# Patient Record
Sex: Female | Born: 1940 | Race: Black or African American | Hispanic: No | Smoking: Former smoker
Health system: Southern US, Community
[De-identification: ages and names within clinical notes are randomized; demographics above are authoritative.]

## PROBLEM LIST (undated history)

## (undated) DIAGNOSIS — I499 Cardiac arrhythmia, unspecified: Secondary | ICD-10-CM

## (undated) DIAGNOSIS — H35349 Macular cyst, hole, or pseudohole, unspecified eye: Secondary | ICD-10-CM

## (undated) DIAGNOSIS — R7303 Prediabetes: Secondary | ICD-10-CM

## (undated) DIAGNOSIS — M1990 Unspecified osteoarthritis, unspecified site: Secondary | ICD-10-CM

## (undated) DIAGNOSIS — R4189 Other symptoms and signs involving cognitive functions and awareness: Secondary | ICD-10-CM

## (undated) DIAGNOSIS — E119 Type 2 diabetes mellitus without complications: Secondary | ICD-10-CM

## (undated) DIAGNOSIS — N289 Disorder of kidney and ureter, unspecified: Secondary | ICD-10-CM

## (undated) DIAGNOSIS — Z973 Presence of spectacles and contact lenses: Secondary | ICD-10-CM

## (undated) DIAGNOSIS — I10 Essential (primary) hypertension: Secondary | ICD-10-CM

## (undated) DIAGNOSIS — D649 Anemia, unspecified: Secondary | ICD-10-CM

## (undated) HISTORY — PX: EYE SURGERY: SHX253

## (undated) HISTORY — DX: Other symptoms and signs involving cognitive functions and awareness: R41.89

## (undated) HISTORY — DX: Prediabetes: R73.03

## (undated) HISTORY — PX: ABDOMINAL HYSTERECTOMY: SHX81

---

## 2000-06-19 ENCOUNTER — Ambulatory Visit (HOSPITAL_BASED_OUTPATIENT_CLINIC_OR_DEPARTMENT_OTHER): Admission: RE | Admit: 2000-06-19 | Discharge: 2000-06-19 | Payer: Self-pay | Admitting: Orthopedic Surgery

## 2000-06-30 ENCOUNTER — Encounter: Admission: RE | Admit: 2000-06-30 | Discharge: 2000-07-18 | Payer: Self-pay | Admitting: Orthopedic Surgery

## 2003-08-18 ENCOUNTER — Ambulatory Visit (HOSPITAL_COMMUNITY): Admission: RE | Admit: 2003-08-18 | Discharge: 2003-08-18 | Payer: Self-pay | Admitting: Family Medicine

## 2003-08-18 ENCOUNTER — Encounter: Payer: Self-pay | Admitting: Occupational Therapy

## 2004-01-23 ENCOUNTER — Other Ambulatory Visit: Admission: RE | Admit: 2004-01-23 | Discharge: 2004-01-23 | Payer: Self-pay | Admitting: Family Medicine

## 2004-09-17 ENCOUNTER — Ambulatory Visit: Payer: Self-pay | Admitting: Family Medicine

## 2005-02-25 ENCOUNTER — Ambulatory Visit: Payer: Self-pay | Admitting: Family Medicine

## 2005-03-26 ENCOUNTER — Ambulatory Visit: Payer: Self-pay | Admitting: Family Medicine

## 2005-04-22 ENCOUNTER — Ambulatory Visit: Payer: Self-pay | Admitting: Family Medicine

## 2005-04-30 ENCOUNTER — Ambulatory Visit (HOSPITAL_COMMUNITY): Admission: RE | Admit: 2005-04-30 | Discharge: 2005-04-30 | Payer: Self-pay | Admitting: Family Medicine

## 2005-05-24 ENCOUNTER — Encounter (INDEPENDENT_AMBULATORY_CARE_PROVIDER_SITE_OTHER): Payer: Self-pay | Admitting: Specialist

## 2005-05-24 ENCOUNTER — Ambulatory Visit (HOSPITAL_COMMUNITY): Admission: RE | Admit: 2005-05-24 | Discharge: 2005-05-24 | Payer: Self-pay | Admitting: Gastroenterology

## 2005-08-23 ENCOUNTER — Ambulatory Visit: Payer: Self-pay | Admitting: Family Medicine

## 2006-01-02 ENCOUNTER — Ambulatory Visit: Payer: Self-pay | Admitting: Family Medicine

## 2006-02-20 ENCOUNTER — Ambulatory Visit: Payer: Self-pay | Admitting: Family Medicine

## 2006-04-03 ENCOUNTER — Ambulatory Visit: Payer: Self-pay | Admitting: Family Medicine

## 2006-05-22 ENCOUNTER — Ambulatory Visit: Payer: Self-pay | Admitting: Family Medicine

## 2006-05-29 ENCOUNTER — Ambulatory Visit (HOSPITAL_COMMUNITY): Admission: RE | Admit: 2006-05-29 | Discharge: 2006-05-29 | Payer: Self-pay | Admitting: Family Medicine

## 2006-06-05 ENCOUNTER — Ambulatory Visit (HOSPITAL_COMMUNITY): Admission: RE | Admit: 2006-06-05 | Discharge: 2006-06-05 | Payer: Self-pay | Admitting: Family Medicine

## 2006-07-04 ENCOUNTER — Ambulatory Visit: Payer: Self-pay | Admitting: Family Medicine

## 2006-11-11 ENCOUNTER — Ambulatory Visit: Payer: Self-pay | Admitting: Family Medicine

## 2007-07-24 DIAGNOSIS — M109 Gout, unspecified: Secondary | ICD-10-CM | POA: Insufficient documentation

## 2007-07-24 DIAGNOSIS — IMO0002 Reserved for concepts with insufficient information to code with codable children: Secondary | ICD-10-CM

## 2007-07-24 DIAGNOSIS — Z860100 Personal history of colon polyps, unspecified: Secondary | ICD-10-CM | POA: Insufficient documentation

## 2007-07-24 DIAGNOSIS — E785 Hyperlipidemia, unspecified: Secondary | ICD-10-CM | POA: Insufficient documentation

## 2007-07-24 DIAGNOSIS — M75100 Unspecified rotator cuff tear or rupture of unspecified shoulder, not specified as traumatic: Secondary | ICD-10-CM | POA: Insufficient documentation

## 2007-07-24 DIAGNOSIS — Z8679 Personal history of other diseases of the circulatory system: Secondary | ICD-10-CM | POA: Insufficient documentation

## 2007-07-24 DIAGNOSIS — E1169 Type 2 diabetes mellitus with other specified complication: Secondary | ICD-10-CM

## 2007-07-24 DIAGNOSIS — M719 Bursopathy, unspecified: Secondary | ICD-10-CM

## 2007-07-24 DIAGNOSIS — E1159 Type 2 diabetes mellitus with other circulatory complications: Secondary | ICD-10-CM | POA: Insufficient documentation

## 2007-07-24 DIAGNOSIS — K279 Peptic ulcer, site unspecified, unspecified as acute or chronic, without hemorrhage or perforation: Secondary | ICD-10-CM | POA: Insufficient documentation

## 2007-07-24 DIAGNOSIS — K5730 Diverticulosis of large intestine without perforation or abscess without bleeding: Secondary | ICD-10-CM | POA: Insufficient documentation

## 2007-07-24 DIAGNOSIS — D509 Iron deficiency anemia, unspecified: Secondary | ICD-10-CM

## 2007-07-24 DIAGNOSIS — M67919 Unspecified disorder of synovium and tendon, unspecified shoulder: Secondary | ICD-10-CM | POA: Insufficient documentation

## 2007-07-24 DIAGNOSIS — E119 Type 2 diabetes mellitus without complications: Secondary | ICD-10-CM

## 2007-07-24 DIAGNOSIS — I10 Essential (primary) hypertension: Secondary | ICD-10-CM | POA: Insufficient documentation

## 2007-07-24 DIAGNOSIS — N259 Disorder resulting from impaired renal tubular function, unspecified: Secondary | ICD-10-CM

## 2007-07-24 DIAGNOSIS — I152 Hypertension secondary to endocrine disorders: Secondary | ICD-10-CM | POA: Insufficient documentation

## 2007-07-24 HISTORY — DX: Hypertension secondary to endocrine disorders: I15.2

## 2007-07-24 HISTORY — DX: Personal history of colonic polyps: Z86.010

## 2007-07-24 HISTORY — DX: Peptic ulcer, site unspecified, unspecified as acute or chronic, without hemorrhage or perforation: K27.9

## 2007-07-24 HISTORY — DX: Reserved for concepts with insufficient information to code with codable children: IMO0002

## 2007-07-24 HISTORY — DX: Personal history of colon polyps, unspecified: Z86.0100

## 2007-07-24 HISTORY — DX: Gout, unspecified: M10.9

## 2007-07-24 HISTORY — DX: Unspecified rotator cuff tear or rupture of unspecified shoulder, not specified as traumatic: M75.100

## 2007-07-24 HISTORY — DX: Type 2 diabetes mellitus with other specified complication: E11.69

## 2007-07-24 HISTORY — DX: Unspecified disorder of synovium and tendon, unspecified shoulder: M67.919

## 2007-07-24 HISTORY — DX: Type 2 diabetes mellitus with other circulatory complications: E11.59

## 2007-07-24 HISTORY — DX: Diverticulosis of large intestine without perforation or abscess without bleeding: K57.30

## 2007-08-04 ENCOUNTER — Telehealth (INDEPENDENT_AMBULATORY_CARE_PROVIDER_SITE_OTHER): Payer: Self-pay | Admitting: *Deleted

## 2007-08-11 ENCOUNTER — Ambulatory Visit: Payer: Self-pay | Admitting: Family Medicine

## 2007-08-11 LAB — CONVERTED CEMR LAB: Blood Glucose, Fingerstick: 95

## 2007-08-13 ENCOUNTER — Ambulatory Visit: Payer: Self-pay | Admitting: Family Medicine

## 2007-08-13 ENCOUNTER — Encounter (INDEPENDENT_AMBULATORY_CARE_PROVIDER_SITE_OTHER): Payer: Self-pay | Admitting: *Deleted

## 2007-08-13 LAB — CONVERTED CEMR LAB
Albumin: 4.3 g/dL (ref 3.5–5.2)
Alkaline Phosphatase: 65 units/L (ref 39–117)
BUN: 18 mg/dL (ref 6–23)
Basophils Absolute: 0 10*3/uL (ref 0.0–0.1)
Basophils Relative: 1 % (ref 0–1)
Chloride: 109 meq/L (ref 96–112)
Glucose, Bld: 90 mg/dL (ref 70–99)
HDL: 45 mg/dL (ref >39)
Hemoglobin: 11.7 g/dL — ABNORMAL LOW (ref 12.0–15.0)
LDL Cholesterol: 58 mg/dL (ref 0–99)
Lymphs Abs: 1.9 10*3/uL (ref 0.7–3.3)
MCHC: 30.1 g/dL (ref 30.0–36.0)
MCV: 80.4 fL (ref 78.0–100.0)
Microalb, Ur: 37.7 mg/dL — ABNORMAL HIGH (ref 0.00–1.89)
Monocytes Absolute: 0.5 10*3/uL (ref 0.2–0.7)
Total Bilirubin: 0.3 mg/dL (ref 0.3–1.2)
Total CHOL/HDL Ratio: 2.9
Total Protein: 7.4 g/dL (ref 6.0–8.3)
Triglycerides: 128 mg/dL (ref <150)

## 2007-09-18 ENCOUNTER — Emergency Department (HOSPITAL_COMMUNITY): Admission: EM | Admit: 2007-09-18 | Discharge: 2007-09-18 | Payer: Self-pay | Admitting: Emergency Medicine

## 2007-09-20 ENCOUNTER — Emergency Department (HOSPITAL_COMMUNITY): Admission: EM | Admit: 2007-09-20 | Discharge: 2007-09-20 | Payer: Self-pay | Admitting: Emergency Medicine

## 2007-09-25 ENCOUNTER — Telehealth (INDEPENDENT_AMBULATORY_CARE_PROVIDER_SITE_OTHER): Payer: Self-pay | Admitting: *Deleted

## 2007-12-25 ENCOUNTER — Ambulatory Visit: Payer: Self-pay | Admitting: Internal Medicine

## 2007-12-25 ENCOUNTER — Encounter (INDEPENDENT_AMBULATORY_CARE_PROVIDER_SITE_OTHER): Payer: Self-pay | Admitting: Family Medicine

## 2007-12-25 LAB — CONVERTED CEMR LAB: Blood Glucose, Fingerstick: 103

## 2008-02-11 ENCOUNTER — Encounter (INDEPENDENT_AMBULATORY_CARE_PROVIDER_SITE_OTHER): Payer: Self-pay | Admitting: Family Medicine

## 2008-05-24 ENCOUNTER — Ambulatory Visit: Payer: Self-pay | Admitting: Family Medicine

## 2008-05-24 LAB — CONVERTED CEMR LAB: Blood Glucose, Fingerstick: 106

## 2008-05-25 ENCOUNTER — Telehealth (INDEPENDENT_AMBULATORY_CARE_PROVIDER_SITE_OTHER): Payer: Self-pay | Admitting: Family Medicine

## 2008-05-27 ENCOUNTER — Ambulatory Visit (HOSPITAL_COMMUNITY): Admission: RE | Admit: 2008-05-27 | Discharge: 2008-05-27 | Payer: Self-pay | Admitting: Family Medicine

## 2008-06-02 LAB — CONVERTED CEMR LAB
ALT: 68 units/L — ABNORMAL HIGH (ref 0–35)
Basophils Relative: 1 % (ref 0–1)
Chloride: 110 meq/L (ref 96–112)
Cholesterol: 152 mg/dL (ref 0–200)
Eosinophils Absolute: 0.2 10*3/uL (ref 0.0–0.7)
Eosinophils Relative: 3 % (ref 0–5)
Glucose, Bld: 98 mg/dL (ref 70–99)
HCT: 40.5 % (ref 36.0–46.0)
HDL: 45 mg/dL (ref >39)
Hemoglobin: 12.3 g/dL (ref 12.0–15.0)
Lymphs Abs: 1.5 10*3/uL (ref 0.7–4.0)
MCV: 79.7 fL (ref 78.0–100.0)
Microalb, Ur: 90.7 mg/dL — ABNORMAL HIGH (ref 0.00–1.89)
Monocytes Relative: 6 % (ref 3–12)
Neutro Abs: 4 10*3/uL (ref 1.7–7.7)
Neutrophils Relative %: 66 % (ref 43–77)
RBC: 5.08 M/uL (ref 3.87–5.11)
RDW: 16.3 % — ABNORMAL HIGH (ref 11.5–15.5)
Sodium: 145 meq/L (ref 135–145)
TSH: 2.036 microintl units/mL (ref 0.350–5.50)
Total Protein: 7.6 g/dL (ref 6.0–8.3)

## 2008-06-08 ENCOUNTER — Ambulatory Visit: Payer: Self-pay | Admitting: Family Medicine

## 2008-06-09 ENCOUNTER — Encounter (INDEPENDENT_AMBULATORY_CARE_PROVIDER_SITE_OTHER): Payer: Self-pay | Admitting: Family Medicine

## 2008-06-26 ENCOUNTER — Telehealth (INDEPENDENT_AMBULATORY_CARE_PROVIDER_SITE_OTHER): Payer: Self-pay | Admitting: *Deleted

## 2008-06-26 LAB — CONVERTED CEMR LAB
Creatinine 24 HR UR: 1018 mg/24hr (ref 700–1800)
Creatinine Clearance: 64 mL/min — ABNORMAL LOW (ref 75–115)
Protein, Ur: 357 mg/24hr — ABNORMAL HIGH (ref 50–100)

## 2008-06-27 ENCOUNTER — Encounter (INDEPENDENT_AMBULATORY_CARE_PROVIDER_SITE_OTHER): Payer: Self-pay | Admitting: Family Medicine

## 2008-06-30 ENCOUNTER — Encounter (INDEPENDENT_AMBULATORY_CARE_PROVIDER_SITE_OTHER): Payer: Self-pay | Admitting: Family Medicine

## 2008-07-06 ENCOUNTER — Ambulatory Visit (HOSPITAL_COMMUNITY): Admission: RE | Admit: 2008-07-06 | Discharge: 2008-07-06 | Payer: Self-pay | Admitting: Family Medicine

## 2008-07-12 ENCOUNTER — Telehealth (INDEPENDENT_AMBULATORY_CARE_PROVIDER_SITE_OTHER): Payer: Self-pay | Admitting: *Deleted

## 2008-08-01 ENCOUNTER — Telehealth (INDEPENDENT_AMBULATORY_CARE_PROVIDER_SITE_OTHER): Payer: Self-pay | Admitting: Family Medicine

## 2008-08-31 ENCOUNTER — Encounter (INDEPENDENT_AMBULATORY_CARE_PROVIDER_SITE_OTHER): Payer: Self-pay | Admitting: Family Medicine

## 2008-09-01 ENCOUNTER — Encounter (INDEPENDENT_AMBULATORY_CARE_PROVIDER_SITE_OTHER): Payer: Self-pay | Admitting: Family Medicine

## 2008-09-23 ENCOUNTER — Encounter (INDEPENDENT_AMBULATORY_CARE_PROVIDER_SITE_OTHER): Payer: Self-pay | Admitting: Family Medicine

## 2008-10-14 ENCOUNTER — Encounter (INDEPENDENT_AMBULATORY_CARE_PROVIDER_SITE_OTHER): Payer: Self-pay | Admitting: Family Medicine

## 2008-10-17 ENCOUNTER — Ambulatory Visit: Payer: Self-pay | Admitting: Family Medicine

## 2008-10-17 LAB — CONVERTED CEMR LAB
Blood Glucose, Fingerstick: 130
Hgb A1c MFr Bld: 5.6 %

## 2008-10-22 ENCOUNTER — Emergency Department (HOSPITAL_COMMUNITY): Admission: EM | Admit: 2008-10-22 | Discharge: 2008-10-22 | Payer: Self-pay | Admitting: Emergency Medicine

## 2008-10-24 ENCOUNTER — Ambulatory Visit (HOSPITAL_COMMUNITY): Admission: RE | Admit: 2008-10-24 | Discharge: 2008-10-24 | Payer: Self-pay | Admitting: Family Medicine

## 2008-10-24 ENCOUNTER — Encounter (INDEPENDENT_AMBULATORY_CARE_PROVIDER_SITE_OTHER): Payer: Self-pay | Admitting: Family Medicine

## 2008-10-28 ENCOUNTER — Encounter (INDEPENDENT_AMBULATORY_CARE_PROVIDER_SITE_OTHER): Payer: Self-pay | Admitting: Family Medicine

## 2009-01-05 ENCOUNTER — Encounter (INDEPENDENT_AMBULATORY_CARE_PROVIDER_SITE_OTHER): Payer: Self-pay | Admitting: Family Medicine

## 2009-01-16 ENCOUNTER — Telehealth (INDEPENDENT_AMBULATORY_CARE_PROVIDER_SITE_OTHER): Payer: Self-pay | Admitting: Family Medicine

## 2009-01-24 ENCOUNTER — Encounter (INDEPENDENT_AMBULATORY_CARE_PROVIDER_SITE_OTHER): Payer: Self-pay | Admitting: Family Medicine

## 2009-02-13 ENCOUNTER — Telehealth (INDEPENDENT_AMBULATORY_CARE_PROVIDER_SITE_OTHER): Payer: Self-pay | Admitting: Family Medicine

## 2009-02-13 ENCOUNTER — Encounter (INDEPENDENT_AMBULATORY_CARE_PROVIDER_SITE_OTHER): Payer: Self-pay | Admitting: Family Medicine

## 2009-03-01 ENCOUNTER — Ambulatory Visit: Payer: Self-pay | Admitting: Family Medicine

## 2009-03-01 DIAGNOSIS — H9319 Tinnitus, unspecified ear: Secondary | ICD-10-CM

## 2009-03-01 LAB — CONVERTED CEMR LAB: Blood Glucose, AC Bkfst: 88 mg/dL

## 2009-03-06 LAB — CONVERTED CEMR LAB
ALT: 30 units/L (ref 0–35)
Albumin: 4.5 g/dL (ref 3.5–5.2)
BUN: 23 mg/dL (ref 6–23)
Basophils Absolute: 0.1 10*3/uL (ref 0.0–0.1)
CO2: 24 meq/L (ref 19–32)
Cholesterol: 169 mg/dL (ref 0–200)
Eosinophils Absolute: 0.1 10*3/uL (ref 0.0–0.7)
Eosinophils Relative: 2 % (ref 0–5)
Glucose, Bld: 96 mg/dL (ref 70–99)
HDL: 49 mg/dL (ref >39)
MCV: 76.8 fL — ABNORMAL LOW (ref 78.0–100.0)
Monocytes Relative: 7 % (ref 3–12)
RBC: 4.83 M/uL (ref 3.87–5.11)
TSH: 2.467 microintl units/mL (ref 0.350–4.500)
Total Protein: 7.8 g/dL (ref 6.0–8.3)
Triglycerides: 139 mg/dL (ref <150)
VLDL: 28 mg/dL (ref 0–40)
WBC: 6.9 10*3/uL (ref 4.0–10.5)

## 2009-05-26 ENCOUNTER — Ambulatory Visit: Payer: Self-pay | Admitting: Internal Medicine

## 2009-05-26 ENCOUNTER — Encounter (INDEPENDENT_AMBULATORY_CARE_PROVIDER_SITE_OTHER): Payer: Self-pay | Admitting: Family Medicine

## 2009-05-26 DIAGNOSIS — M1990 Unspecified osteoarthritis, unspecified site: Secondary | ICD-10-CM | POA: Insufficient documentation

## 2009-05-29 LAB — CONVERTED CEMR LAB
ALT: 33 units/L (ref 0–35)
AST: 17 units/L (ref 0–37)
Alkaline Phosphatase: 56 units/L (ref 39–117)
BUN: 26 mg/dL — ABNORMAL HIGH (ref 6–23)
Glucose, Bld: 99 mg/dL (ref 70–99)
Sodium: 143 meq/L (ref 135–145)
Total Bilirubin: 0.3 mg/dL (ref 0.3–1.2)

## 2009-06-02 ENCOUNTER — Ambulatory Visit (HOSPITAL_COMMUNITY): Admission: RE | Admit: 2009-06-02 | Discharge: 2009-06-02 | Payer: Self-pay | Admitting: Family Medicine

## 2009-06-05 ENCOUNTER — Ambulatory Visit: Payer: Self-pay | Admitting: Internal Medicine

## 2009-06-05 ENCOUNTER — Encounter (INDEPENDENT_AMBULATORY_CARE_PROVIDER_SITE_OTHER): Payer: Self-pay | Admitting: Family Medicine

## 2009-06-06 LAB — CONVERTED CEMR LAB
Calcium: 9.6 mg/dL (ref 8.4–10.5)
Chloride: 110 meq/L (ref 96–112)
Glucose, Bld: 107 mg/dL — ABNORMAL HIGH (ref 70–99)
Potassium: 3.7 meq/L (ref 3.5–5.3)

## 2009-06-13 ENCOUNTER — Telehealth (INDEPENDENT_AMBULATORY_CARE_PROVIDER_SITE_OTHER): Payer: Self-pay | Admitting: Internal Medicine

## 2009-06-30 ENCOUNTER — Ambulatory Visit: Payer: Self-pay | Admitting: Internal Medicine

## 2009-07-19 ENCOUNTER — Encounter: Payer: Self-pay | Admitting: Physician Assistant

## 2009-07-19 ENCOUNTER — Telehealth (INDEPENDENT_AMBULATORY_CARE_PROVIDER_SITE_OTHER): Payer: Self-pay | Admitting: *Deleted

## 2009-08-23 ENCOUNTER — Encounter: Admission: RE | Admit: 2009-08-23 | Discharge: 2009-08-23 | Payer: Self-pay | Admitting: Cardiovascular Disease

## 2009-09-09 ENCOUNTER — Encounter: Admission: RE | Admit: 2009-09-09 | Discharge: 2009-09-09 | Payer: Self-pay | Admitting: Cardiovascular Disease

## 2010-04-17 ENCOUNTER — Encounter: Payer: Self-pay | Admitting: Physician Assistant

## 2010-05-02 ENCOUNTER — Telehealth: Payer: Self-pay | Admitting: Physician Assistant

## 2010-06-04 ENCOUNTER — Ambulatory Visit (HOSPITAL_COMMUNITY): Admission: RE | Admit: 2010-06-04 | Discharge: 2010-06-04 | Payer: Self-pay | Admitting: Cardiovascular Disease

## 2010-12-10 ENCOUNTER — Telehealth: Payer: Self-pay | Admitting: Internal Medicine

## 2010-12-29 ENCOUNTER — Encounter: Payer: Self-pay | Admitting: Family Medicine

## 2010-12-31 ENCOUNTER — Encounter: Payer: Self-pay | Admitting: Family Medicine

## 2011-01-08 NOTE — Progress Notes (Signed)
  Phone Note Outgoing Call   Summary of Call: Is she still a patient here. Has not had f/u for diabetes and HTN in a long time. . . schedule f/u if still coming here. Initial call taken by: Versie Starks,  May 02, 2010 10:13 AM  Follow-up for Phone Call        spoke with pt and she said she is not with [place] anymore... Follow-up by: [place] Shannon,  May 04, 2010 3:05 PM

## 2011-01-10 NOTE — Progress Notes (Signed)
----   Converted from flag ---- ---- 05/26/2009 5:05 PM, Lysle Pearl MD wrote: Failed NewRx: ePrescribing Error Acknowledgement;  Message type not supported by sender Message id - [phone] [place]. Dr Name - Lysle Pearl. Pharmacy Name - [facility]. #[id number]*;  Pharmacy: [facility]. EO:[id number]*;  Medication: NORVASC 10 MG  TABS;  Instructions: one once daily;  Quantity: 30 Tablet;  Refills: 111;  Entry Date: [id number];  Transaction ID: [id number];   ------------------------------

## 2011-01-31 ENCOUNTER — Ambulatory Visit
Admission: RE | Admit: 2011-01-31 | Discharge: 2011-01-31 | Disposition: A | Discharge status: Home / Self Care | Payer: Medicare Other | Source: Ambulatory Visit | Attending: Cardiovascular Disease | Admitting: Cardiovascular Disease

## 2011-01-31 ENCOUNTER — Other Ambulatory Visit: Payer: Self-pay | Admitting: Cardiovascular Disease

## 2011-01-31 DIAGNOSIS — M25561 Pain in right knee: Secondary | ICD-10-CM

## 2011-03-26 ENCOUNTER — Other Ambulatory Visit (HOSPITAL_COMMUNITY): Payer: Self-pay | Admitting: Orthopedic Surgery

## 2011-04-10 ENCOUNTER — Ambulatory Visit (HOSPITAL_COMMUNITY)
Admission: RE | Admit: 2011-04-10 | Discharge: 2011-04-10 | Disposition: A | Discharge status: Home / Self Care | Payer: Medicare Other | Source: Ambulatory Visit | Attending: Orthopedic Surgery | Admitting: Orthopedic Surgery

## 2011-04-10 DIAGNOSIS — Z780 Asymptomatic menopausal state: Secondary | ICD-10-CM | POA: Insufficient documentation

## 2011-04-10 DIAGNOSIS — Z13820 Encounter for screening for osteoporosis: Secondary | ICD-10-CM | POA: Insufficient documentation

## 2011-04-26 NOTE — Op Note (Signed)
[place]  Patient:    Megan Stevenson, Megan Stevenson                       MRN: [id number] Proc. Date: 06/19/00 Adm. 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  POSTOPERATIVE DIAGNOSIS:  Right shoulder significant tearing anterior and posterior labrum.  Partial thickness tearing rotator cuff above and below, but no full-thickness tears.  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  PROCEDURE:  Right shoulder examination under anesthesia.  Arthroscopy with debridement of labrum, glenoid, humeral head.  Debridement of rotator cuff above and below.  Arthroscopic acromioplasty, ACA ligament release, and distal clavicle excision.  SURGEON:  Ninetta Lights, M.D.  ASSISTANT:  Aaron Edelman D. Petrarca, P.A.-C.  ANESTHESIA:  General anesthesia.  ESTIMATED BLOOD LOSS:  Minimal.  SPECIMENS:  None.  CULTURES:  None.  COMPLICATIONS:  None.  DRESSING:  Soft compressive.  FINDINGS:  Full motion stable shoulder.  At arthroscopy grade II and III changes diffusely on the humeral head debrided to a stable surface and removing chondral flaps.  Glenoid had very mild changes.  Significant atritional tearing of the labrum all the way around debrided to a stable surface.  Capsule and ligamentous structure intact.  Biceps, biceps anchor, and hiatus all normal.  Atritional tearing on the undersurface of the crescent region of the cuff, debrided.  No full-thickness tears.  From above, chronic impingement with significant spurs AC joint and anterior acromion.  Abrasive changes on top of the cuff, but no full-thickness tears.  Grade IV changes of the AC joint.  DESCRIPTION OF PROCEDURE:  The patient was brought to the  operating room and placed on the operating table in the supine position.  After adequate anesthesia had been obtained, the shoulder was examined with full motion and excellent stability. Placed in a beach chair position and the shoulder position and prepped and draped in the usual sterile fashion.  Three standard arthroscopic portals, anterior, posterior, and lateral.  The shoulder was distended and inspected.  Labral tear  uneven articular cartilage on the humerus, undersurface of the cuff all debrided to a stable surface with a shaver.  Cannula redirected subacromially.  Bursa resected and cuff debrided.  Acromioplasty with shaver and highspeed bur converting from a type III to a type I acromion, removing all spurs, releasing CA ligament. Distal clavicle exposed.  Grade IV changes.  Lateral cm sharply resected along with any periarticular spurs on either side of the joint.  Adequacy of decompression and  clavicle excision confirmed view from all portals.  Instruments and fluid removed. Portals were injected with Marcaine.  Portals were closed with 4-0 nylon. Sterile compressive dressing applied.  Anesthesia reversed and brought to the recovery room.  Tolerated the surgery well with no complications. DD:  06/19/00 TD:  06/19/00 Job: [id number]

## 2011-04-26 NOTE — Op Note (Signed)
NAME:  Megan Stevenson, Megan Stevenson                ACCOUNT NO.:  [id number]   MEDICAL RECORD NO.:  [id number]          PATIENT TYPE:  AMB   LOCATION:  ENDO                         FACILITY:  [facility]   PHYSICIAN:  Nelwyn Salisbury, M.D.  DATE OF BIRTH:  02-01-1941   DATE OF PROCEDURE:  05/24/2005  DATE OF DISCHARGE:                                 OPERATIVE REPORT   PROCEDURE PERFORMED:  Colonoscopy with snare polypectomy times one.   ENDOSCOPIST:  Juanita Craver, M.D.   INSTRUMENT USED:  Olympus video colonoscope.   INDICATIONS FOR PROCEDURE:  The patient is a 70 year old African-American  female with history of change in bowel habits and guaiac positive stools.  Undergoing screening colonoscopy.  Rule out colonic polyps, masses, etc.   PREPROCEDURE PREPARATION:  Informed consent was procured from the patient.  The patient was fasted for eight hours prior to the procedure and prepped  with a bottle of magnesium citrate and a gallon of GoLYTELY the night prior  to the procedure.  The risks and benefits of the procedure including a 10%  miss rate for colon polyps or cancers was discussed with the patient as  well.   PREPROCEDURE PHYSICAL:  The patient had stable vital signs.  Neck supple.  Chest clear to auscultation.  S1 and S2 regular.  Abdomen soft with normal  bowel sounds.   DESCRIPTION OF PROCEDURE:  The patient was placed in left lateral decubitus  position and sedated with 140 mg of Demerol and 14 mg of Versed in slow  incremental doses.  Once the patient was adequately sedated and maintained  on low flow oxygen and continuous cardiac monitoring, the Olympus video  colonoscope was advanced from the rectum to the cecum with difficulty.  There was some residual stool in the colon and multiple washes were done.  The patient had extensive sigmoid diverticulosis.  A small sessile polyp was  snared from 30 cm.  Internal hemorrhoids were seen on retroflexion.  The  rest of the exam was unremarkable.   The appendicular orifice and ileocecal  valve were clearly visualized and photographed.  The procedure was somewhat  prolonged as the patient was combative and difficult to sedate.  Small  lesions could be missed. 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   IMPRESSION:  1.  Small internal hemorrhoids.  2.  Extensive sigmoid diverticulosis.  3.  Small sessile polyp snared from 30 cm.  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  1.  Significant amount of residual stool in the right colon.   RECOMMENDATIONS:  1.  Await pathology results.  2.  Avoid all nonsteroidals including aspirin for the next three weeks.  3.  Repeat colonoscopy depending on pathology results.  4.  Outpatient followup in the next two weeks for further recommendations.       JNM/MEDQ  D:  05/24/2005  T:  05/24/2005  Job:  [id number]   cc:  [place] R. Rankins, M.D.  [id number] [place] [phone]  Fax: [phone]

## 2011-05-10 ENCOUNTER — Other Ambulatory Visit (HOSPITAL_COMMUNITY): Payer: Self-pay | Admitting: Nephrology

## 2011-05-10 DIAGNOSIS — Z1231 Encounter for screening mammogram for malignant neoplasm of breast: Secondary | ICD-10-CM

## 2011-06-07 ENCOUNTER — Ambulatory Visit (HOSPITAL_COMMUNITY): Payer: Medicare Other

## 2011-08-14 ENCOUNTER — Ambulatory Visit (HOSPITAL_COMMUNITY)
Admission: RE | Admit: 2011-08-14 | Discharge: 2011-08-14 | Disposition: A | Discharge status: Home / Self Care | Payer: Medicare Other | Source: Ambulatory Visit | Attending: Nephrology | Admitting: Nephrology

## 2011-08-14 DIAGNOSIS — Z1231 Encounter for screening mammogram for malignant neoplasm of breast: Secondary | ICD-10-CM

## 2011-09-19 LAB — DIFFERENTIAL
Basophils Absolute: 0
Basophils Absolute: 0
Basophils Relative: 0
Eosinophils Absolute: 0.1
Eosinophils Absolute: 0.1
Eosinophils Relative: 1
Monocytes Absolute: 0.6
Monocytes Absolute: 0.8 — ABNORMAL HIGH
Monocytes Relative: 7
Neutro Abs: 8.6 — ABNORMAL HIGH

## 2011-09-19 LAB — CBC
HCT: 40.6
Hemoglobin: 12.3
Hemoglobin: 13.1
MCHC: 32.1
MCHC: 32.3
MCV: 77.5 — ABNORMAL LOW
MCV: 77.8 — ABNORMAL LOW
Platelets: 319
RBC: 4.94
RDW: 16.6 — ABNORMAL HIGH
RDW: 17.1 — ABNORMAL HIGH

## 2011-09-19 LAB — URINALYSIS, ROUTINE W REFLEX MICROSCOPIC
Ketones, ur: NEGATIVE
Leukocytes, UA: NEGATIVE
Nitrite: NEGATIVE
Nitrite: NEGATIVE
Protein, ur: >300 — AB
Specific Gravity, Urine: 1.013
Specific Gravity, Urine: 1.019
Urobilinogen, UA: 0.2
Urobilinogen, UA: 0.2
pH: 5.5

## 2011-09-19 LAB — URINE MICROSCOPIC-ADD ON

## 2011-09-19 LAB — I-STAT 8, (EC8 V) (CONVERTED LAB)
Chloride: 109
Glucose, Bld: 98
Hemoglobin: 15.3 — ABNORMAL HIGH
Potassium: 3.7
Sodium: 143
pH, Ven: 7.327 — ABNORMAL HIGH

## 2011-09-19 LAB — POCT CARDIAC MARKERS
Myoglobin, poc: 163
Operator id: 234501

## 2011-12-13 DIAGNOSIS — Z79899 Other long term (current) drug therapy: Secondary | ICD-10-CM | POA: Diagnosis not present

## 2011-12-13 DIAGNOSIS — E119 Type 2 diabetes mellitus without complications: Secondary | ICD-10-CM | POA: Diagnosis not present

## 2011-12-13 DIAGNOSIS — M25569 Pain in unspecified knee: Secondary | ICD-10-CM | POA: Diagnosis not present

## 2011-12-13 DIAGNOSIS — I10 Essential (primary) hypertension: Secondary | ICD-10-CM | POA: Diagnosis not present

## 2011-12-27 DIAGNOSIS — Z79899 Other long term (current) drug therapy: Secondary | ICD-10-CM | POA: Diagnosis not present

## 2011-12-27 DIAGNOSIS — I10 Essential (primary) hypertension: Secondary | ICD-10-CM | POA: Diagnosis not present

## 2011-12-27 DIAGNOSIS — Z8249 Family history of ischemic heart disease and other diseases of the circulatory system: Secondary | ICD-10-CM | POA: Diagnosis not present

## 2012-01-27 DIAGNOSIS — M545 Low back pain, unspecified: Secondary | ICD-10-CM | POA: Diagnosis not present

## 2012-01-27 DIAGNOSIS — R233 Spontaneous ecchymoses: Secondary | ICD-10-CM | POA: Diagnosis not present

## 2012-01-27 DIAGNOSIS — M2550 Pain in unspecified joint: Secondary | ICD-10-CM | POA: Diagnosis not present

## 2012-01-27 DIAGNOSIS — M25569 Pain in unspecified knee: Secondary | ICD-10-CM | POA: Diagnosis not present

## 2012-01-27 DIAGNOSIS — R6889 Other general symptoms and signs: Secondary | ICD-10-CM | POA: Diagnosis not present

## 2012-02-20 DIAGNOSIS — E119 Type 2 diabetes mellitus without complications: Secondary | ICD-10-CM | POA: Diagnosis not present

## 2012-02-26 DIAGNOSIS — N183 Chronic kidney disease, stage 3 unspecified: Secondary | ICD-10-CM | POA: Diagnosis not present

## 2012-02-26 DIAGNOSIS — D649 Anemia, unspecified: Secondary | ICD-10-CM | POA: Diagnosis not present

## 2012-02-26 DIAGNOSIS — N2581 Secondary hyperparathyroidism of renal origin: Secondary | ICD-10-CM | POA: Diagnosis not present

## 2012-02-26 DIAGNOSIS — M109 Gout, unspecified: Secondary | ICD-10-CM | POA: Diagnosis not present

## 2012-02-26 DIAGNOSIS — R809 Proteinuria, unspecified: Secondary | ICD-10-CM | POA: Diagnosis not present

## 2012-03-02 DIAGNOSIS — M545 Low back pain, unspecified: Secondary | ICD-10-CM | POA: Diagnosis not present

## 2012-03-02 DIAGNOSIS — M6530 Trigger finger, unspecified finger: Secondary | ICD-10-CM | POA: Diagnosis not present

## 2012-03-02 DIAGNOSIS — M1710 Unilateral primary osteoarthritis, unspecified knee: Secondary | ICD-10-CM | POA: Diagnosis not present

## 2012-03-04 DIAGNOSIS — M109 Gout, unspecified: Secondary | ICD-10-CM | POA: Diagnosis not present

## 2012-03-04 DIAGNOSIS — E119 Type 2 diabetes mellitus without complications: Secondary | ICD-10-CM | POA: Diagnosis not present

## 2012-03-04 DIAGNOSIS — I129 Hypertensive chronic kidney disease with stage 1 through stage 4 chronic kidney disease, or unspecified chronic kidney disease: Secondary | ICD-10-CM | POA: Diagnosis not present

## 2012-03-04 DIAGNOSIS — N183 Chronic kidney disease, stage 3 unspecified: Secondary | ICD-10-CM | POA: Diagnosis not present

## 2012-03-26 DIAGNOSIS — I10 Essential (primary) hypertension: Secondary | ICD-10-CM | POA: Diagnosis not present

## 2012-03-26 DIAGNOSIS — Z79899 Other long term (current) drug therapy: Secondary | ICD-10-CM | POA: Diagnosis not present

## 2012-03-26 DIAGNOSIS — E119 Type 2 diabetes mellitus without complications: Secondary | ICD-10-CM | POA: Diagnosis not present

## 2012-03-27 DIAGNOSIS — H2510 Age-related nuclear cataract, unspecified eye: Secondary | ICD-10-CM | POA: Diagnosis not present

## 2012-05-04 DIAGNOSIS — H2510 Age-related nuclear cataract, unspecified eye: Secondary | ICD-10-CM | POA: Diagnosis not present

## 2012-05-04 DIAGNOSIS — H269 Unspecified cataract: Secondary | ICD-10-CM | POA: Diagnosis not present

## 2012-05-05 DIAGNOSIS — H2510 Age-related nuclear cataract, unspecified eye: Secondary | ICD-10-CM | POA: Diagnosis not present

## 2012-06-01 DIAGNOSIS — H269 Unspecified cataract: Secondary | ICD-10-CM | POA: Diagnosis not present

## 2012-06-01 DIAGNOSIS — H2510 Age-related nuclear cataract, unspecified eye: Secondary | ICD-10-CM | POA: Diagnosis not present

## 2012-06-08 DIAGNOSIS — I10 Essential (primary) hypertension: Secondary | ICD-10-CM | POA: Diagnosis not present

## 2012-06-08 DIAGNOSIS — Z79899 Other long term (current) drug therapy: Secondary | ICD-10-CM | POA: Diagnosis not present

## 2012-06-08 DIAGNOSIS — M25569 Pain in unspecified knee: Secondary | ICD-10-CM | POA: Diagnosis not present

## 2012-08-12 DIAGNOSIS — D6489 Other specified anemias: Secondary | ICD-10-CM | POA: Diagnosis not present

## 2012-08-12 DIAGNOSIS — I10 Essential (primary) hypertension: Secondary | ICD-10-CM | POA: Diagnosis not present

## 2012-08-12 DIAGNOSIS — E559 Vitamin D deficiency, unspecified: Secondary | ICD-10-CM | POA: Diagnosis not present

## 2012-08-12 DIAGNOSIS — E119 Type 2 diabetes mellitus without complications: Secondary | ICD-10-CM | POA: Diagnosis not present

## 2012-08-12 DIAGNOSIS — Z79899 Other long term (current) drug therapy: Secondary | ICD-10-CM | POA: Diagnosis not present

## 2012-09-18 ENCOUNTER — Other Ambulatory Visit (HOSPITAL_COMMUNITY): Payer: Self-pay | Admitting: Internal Medicine

## 2012-09-18 DIAGNOSIS — Z1231 Encounter for screening mammogram for malignant neoplasm of breast: Secondary | ICD-10-CM

## 2012-10-08 ENCOUNTER — Ambulatory Visit (HOSPITAL_COMMUNITY): Payer: Medicare Other

## 2012-10-28 DIAGNOSIS — N183 Chronic kidney disease, stage 3 unspecified: Secondary | ICD-10-CM | POA: Diagnosis not present

## 2012-10-28 DIAGNOSIS — I129 Hypertensive chronic kidney disease with stage 1 through stage 4 chronic kidney disease, or unspecified chronic kidney disease: Secondary | ICD-10-CM | POA: Diagnosis not present

## 2012-10-28 DIAGNOSIS — Z23 Encounter for immunization: Secondary | ICD-10-CM | POA: Diagnosis not present

## 2012-10-28 DIAGNOSIS — M1990 Unspecified osteoarthritis, unspecified site: Secondary | ICD-10-CM | POA: Diagnosis not present

## 2012-10-28 DIAGNOSIS — E119 Type 2 diabetes mellitus without complications: Secondary | ICD-10-CM | POA: Diagnosis not present

## 2012-10-28 DIAGNOSIS — I10 Essential (primary) hypertension: Secondary | ICD-10-CM | POA: Diagnosis not present

## 2012-10-29 ENCOUNTER — Ambulatory Visit (HOSPITAL_COMMUNITY)
Admission: RE | Admit: 2012-10-29 | Discharge: 2012-10-29 | Disposition: A | Discharge status: Home / Self Care | Payer: Medicare Other | Source: Ambulatory Visit | Attending: Internal Medicine | Admitting: Internal Medicine

## 2012-10-29 DIAGNOSIS — Z1231 Encounter for screening mammogram for malignant neoplasm of breast: Secondary | ICD-10-CM | POA: Insufficient documentation

## 2012-12-17 DIAGNOSIS — E119 Type 2 diabetes mellitus without complications: Secondary | ICD-10-CM | POA: Diagnosis not present

## 2012-12-17 DIAGNOSIS — I10 Essential (primary) hypertension: Secondary | ICD-10-CM | POA: Diagnosis not present

## 2012-12-17 DIAGNOSIS — N183 Chronic kidney disease, stage 3 unspecified: Secondary | ICD-10-CM | POA: Diagnosis not present

## 2012-12-17 DIAGNOSIS — Z79899 Other long term (current) drug therapy: Secondary | ICD-10-CM | POA: Diagnosis not present

## 2013-03-19 DIAGNOSIS — N183 Chronic kidney disease, stage 3 unspecified: Secondary | ICD-10-CM | POA: Diagnosis not present

## 2013-03-19 DIAGNOSIS — Z23 Encounter for immunization: Secondary | ICD-10-CM | POA: Diagnosis not present

## 2013-03-19 DIAGNOSIS — E1165 Type 2 diabetes mellitus with hyperglycemia: Secondary | ICD-10-CM | POA: Diagnosis not present

## 2013-03-19 DIAGNOSIS — I129 Hypertensive chronic kidney disease with stage 1 through stage 4 chronic kidney disease, or unspecified chronic kidney disease: Secondary | ICD-10-CM | POA: Diagnosis not present

## 2013-03-19 DIAGNOSIS — D649 Anemia, unspecified: Secondary | ICD-10-CM | POA: Diagnosis not present

## 2013-03-19 DIAGNOSIS — Z0000 Encounter for general adult medical examination without abnormal findings: Secondary | ICD-10-CM | POA: Diagnosis not present

## 2013-03-19 DIAGNOSIS — D6489 Other specified anemias: Secondary | ICD-10-CM | POA: Diagnosis not present

## 2013-03-19 DIAGNOSIS — R209 Unspecified disturbances of skin sensation: Secondary | ICD-10-CM | POA: Diagnosis not present

## 2013-03-19 DIAGNOSIS — E559 Vitamin D deficiency, unspecified: Secondary | ICD-10-CM | POA: Diagnosis not present

## 2013-03-19 DIAGNOSIS — E1129 Type 2 diabetes mellitus with other diabetic kidney complication: Secondary | ICD-10-CM | POA: Diagnosis not present

## 2013-04-30 DIAGNOSIS — D649 Anemia, unspecified: Secondary | ICD-10-CM | POA: Diagnosis not present

## 2013-04-30 DIAGNOSIS — N183 Chronic kidney disease, stage 3 unspecified: Secondary | ICD-10-CM | POA: Diagnosis not present

## 2013-04-30 DIAGNOSIS — N2581 Secondary hyperparathyroidism of renal origin: Secondary | ICD-10-CM | POA: Diagnosis not present

## 2013-05-11 DIAGNOSIS — N183 Chronic kidney disease, stage 3 unspecified: Secondary | ICD-10-CM | POA: Diagnosis not present

## 2013-05-11 DIAGNOSIS — D509 Iron deficiency anemia, unspecified: Secondary | ICD-10-CM | POA: Diagnosis not present

## 2013-05-11 DIAGNOSIS — I129 Hypertensive chronic kidney disease with stage 1 through stage 4 chronic kidney disease, or unspecified chronic kidney disease: Secondary | ICD-10-CM | POA: Diagnosis not present

## 2013-05-11 DIAGNOSIS — E119 Type 2 diabetes mellitus without complications: Secondary | ICD-10-CM | POA: Diagnosis not present

## 2013-05-14 ENCOUNTER — Other Ambulatory Visit (HOSPITAL_COMMUNITY): Payer: Self-pay | Admitting: *Deleted

## 2013-05-17 ENCOUNTER — Encounter (HOSPITAL_COMMUNITY)
Admission: RE | Admit: 2013-05-17 | Discharge: 2013-05-17 | Disposition: A | Discharge status: Home / Self Care | Payer: Medicare Other | Source: Ambulatory Visit | Attending: Nephrology | Admitting: Nephrology

## 2013-05-17 DIAGNOSIS — D509 Iron deficiency anemia, unspecified: Secondary | ICD-10-CM | POA: Diagnosis not present

## 2013-05-17 MED ORDER — SODIUM CHLORIDE 0.9 % IV SOLN: INTRAVENOUS | Status: DC

## 2013-05-17 MED ORDER — FERUMOXYTOL INJECTION 510 MG/17 ML: 510.0000 mg | INTRAVENOUS | Status: DC

## 2013-05-17 MED ORDER — FERUMOXYTOL INJECTION 510 MG/17 ML
INTRAVENOUS | Status: AC
  Administered 2013-05-17: 510 mg
  Filled 2013-05-17: qty 17

## 2013-05-17 MED ORDER — FERUMOXYTOL INJECTION 510 MG/17 ML: 510.0000 mg | Freq: Once | INTRAVENOUS | Status: DC

## 2013-05-24 ENCOUNTER — Encounter (HOSPITAL_COMMUNITY)
Admission: RE | Admit: 2013-05-24 | Discharge: 2013-05-24 | Disposition: A | Discharge status: Home / Self Care | Payer: Medicare Other | Source: Ambulatory Visit | Attending: Nephrology | Admitting: Nephrology

## 2013-05-24 MED ORDER — FERUMOXYTOL INJECTION 510 MG/17 ML
INTRAVENOUS | Status: AC
  Administered 2013-05-24: 510 mg
  Filled 2013-05-24: qty 17

## 2013-05-24 MED ORDER — SODIUM CHLORIDE 0.9 % IV SOLN
INTRAVENOUS | Status: DC
  Administered 2013-05-24: 09:00:00 via INTRAVENOUS

## 2013-05-24 MED ORDER — FERUMOXYTOL INJECTION 510 MG/17 ML: 510.0000 mg | INTRAVENOUS | Status: DC

## 2013-07-07 DIAGNOSIS — Z79899 Other long term (current) drug therapy: Secondary | ICD-10-CM | POA: Diagnosis not present

## 2013-07-07 DIAGNOSIS — E1165 Type 2 diabetes mellitus with hyperglycemia: Secondary | ICD-10-CM | POA: Diagnosis not present

## 2013-07-07 DIAGNOSIS — E559 Vitamin D deficiency, unspecified: Secondary | ICD-10-CM | POA: Diagnosis not present

## 2013-07-07 DIAGNOSIS — E1129 Type 2 diabetes mellitus with other diabetic kidney complication: Secondary | ICD-10-CM | POA: Diagnosis not present

## 2013-07-07 DIAGNOSIS — N183 Chronic kidney disease, stage 3 unspecified: Secondary | ICD-10-CM | POA: Diagnosis not present

## 2013-07-07 DIAGNOSIS — I129 Hypertensive chronic kidney disease with stage 1 through stage 4 chronic kidney disease, or unspecified chronic kidney disease: Secondary | ICD-10-CM | POA: Diagnosis not present

## 2013-09-20 DIAGNOSIS — I129 Hypertensive chronic kidney disease with stage 1 through stage 4 chronic kidney disease, or unspecified chronic kidney disease: Secondary | ICD-10-CM | POA: Diagnosis not present

## 2013-09-20 DIAGNOSIS — D509 Iron deficiency anemia, unspecified: Secondary | ICD-10-CM | POA: Diagnosis not present

## 2013-09-20 DIAGNOSIS — IMO0001 Reserved for inherently not codable concepts without codable children: Secondary | ICD-10-CM | POA: Diagnosis not present

## 2013-09-20 DIAGNOSIS — N183 Chronic kidney disease, stage 3 unspecified: Secondary | ICD-10-CM | POA: Diagnosis not present

## 2013-09-21 DIAGNOSIS — D509 Iron deficiency anemia, unspecified: Secondary | ICD-10-CM | POA: Diagnosis not present

## 2013-09-21 DIAGNOSIS — E1129 Type 2 diabetes mellitus with other diabetic kidney complication: Secondary | ICD-10-CM | POA: Diagnosis not present

## 2013-09-21 DIAGNOSIS — I10 Essential (primary) hypertension: Secondary | ICD-10-CM | POA: Diagnosis not present

## 2013-09-21 DIAGNOSIS — N189 Chronic kidney disease, unspecified: Secondary | ICD-10-CM | POA: Diagnosis not present

## 2013-09-21 DIAGNOSIS — Z1331 Encounter for screening for depression: Secondary | ICD-10-CM | POA: Diagnosis not present

## 2013-09-21 DIAGNOSIS — M1990 Unspecified osteoarthritis, unspecified site: Secondary | ICD-10-CM | POA: Diagnosis not present

## 2013-09-21 DIAGNOSIS — K219 Gastro-esophageal reflux disease without esophagitis: Secondary | ICD-10-CM | POA: Diagnosis not present

## 2013-09-21 DIAGNOSIS — M6740 Ganglion, unspecified site: Secondary | ICD-10-CM | POA: Diagnosis not present

## 2013-10-08 DIAGNOSIS — R634 Abnormal weight loss: Secondary | ICD-10-CM | POA: Diagnosis not present

## 2013-10-08 DIAGNOSIS — D509 Iron deficiency anemia, unspecified: Secondary | ICD-10-CM | POA: Diagnosis not present

## 2013-10-28 ENCOUNTER — Other Ambulatory Visit: Payer: Self-pay | Admitting: Gastroenterology

## 2013-10-28 DIAGNOSIS — K2980 Duodenitis without bleeding: Secondary | ICD-10-CM | POA: Diagnosis not present

## 2013-10-28 DIAGNOSIS — K5520 Angiodysplasia of colon without hemorrhage: Secondary | ICD-10-CM | POA: Diagnosis not present

## 2013-10-28 DIAGNOSIS — D509 Iron deficiency anemia, unspecified: Secondary | ICD-10-CM | POA: Diagnosis not present

## 2013-10-28 DIAGNOSIS — K2970 Gastritis, unspecified, without bleeding: Secondary | ICD-10-CM | POA: Diagnosis not present

## 2013-10-28 DIAGNOSIS — A048 Other specified bacterial intestinal infections: Secondary | ICD-10-CM | POA: Diagnosis not present

## 2013-10-28 DIAGNOSIS — K5730 Diverticulosis of large intestine without perforation or abscess without bleeding: Secondary | ICD-10-CM | POA: Diagnosis not present

## 2013-10-28 DIAGNOSIS — D1330 Benign neoplasm of unspecified part of small intestine: Secondary | ICD-10-CM | POA: Diagnosis not present

## 2013-10-28 DIAGNOSIS — K319 Disease of stomach and duodenum, unspecified: Secondary | ICD-10-CM | POA: Diagnosis not present

## 2013-10-28 DIAGNOSIS — K648 Other hemorrhoids: Secondary | ICD-10-CM | POA: Diagnosis not present

## 2013-11-16 DIAGNOSIS — D509 Iron deficiency anemia, unspecified: Secondary | ICD-10-CM | POA: Diagnosis not present

## 2013-11-16 DIAGNOSIS — E1129 Type 2 diabetes mellitus with other diabetic kidney complication: Secondary | ICD-10-CM | POA: Diagnosis not present

## 2013-11-16 DIAGNOSIS — N189 Chronic kidney disease, unspecified: Secondary | ICD-10-CM | POA: Diagnosis not present

## 2013-11-22 DIAGNOSIS — D509 Iron deficiency anemia, unspecified: Secondary | ICD-10-CM | POA: Diagnosis not present

## 2013-11-23 DIAGNOSIS — Z23 Encounter for immunization: Secondary | ICD-10-CM | POA: Diagnosis not present

## 2013-11-23 DIAGNOSIS — E785 Hyperlipidemia, unspecified: Secondary | ICD-10-CM | POA: Diagnosis not present

## 2013-11-23 DIAGNOSIS — N189 Chronic kidney disease, unspecified: Secondary | ICD-10-CM | POA: Diagnosis not present

## 2013-11-23 DIAGNOSIS — D509 Iron deficiency anemia, unspecified: Secondary | ICD-10-CM | POA: Diagnosis not present

## 2013-11-23 DIAGNOSIS — I10 Essential (primary) hypertension: Secondary | ICD-10-CM | POA: Diagnosis not present

## 2013-11-23 DIAGNOSIS — Z0000 Encounter for general adult medical examination without abnormal findings: Secondary | ICD-10-CM | POA: Diagnosis not present

## 2013-11-23 DIAGNOSIS — M1990 Unspecified osteoarthritis, unspecified site: Secondary | ICD-10-CM | POA: Diagnosis not present

## 2013-11-23 DIAGNOSIS — E1129 Type 2 diabetes mellitus with other diabetic kidney complication: Secondary | ICD-10-CM | POA: Diagnosis not present

## 2013-11-23 DIAGNOSIS — K2960 Other gastritis without bleeding: Secondary | ICD-10-CM | POA: Diagnosis not present

## 2013-11-24 ENCOUNTER — Other Ambulatory Visit (HOSPITAL_COMMUNITY): Payer: Self-pay | Admitting: Internal Medicine

## 2013-11-24 DIAGNOSIS — Z1231 Encounter for screening mammogram for malignant neoplasm of breast: Secondary | ICD-10-CM

## 2013-12-04 ENCOUNTER — Emergency Department (HOSPITAL_COMMUNITY)
Admission: EM | Admit: 2013-12-04 | Discharge: 2013-12-04 | Disposition: A | Discharge status: Home / Self Care | Payer: Medicare Other | Attending: Emergency Medicine | Admitting: Emergency Medicine

## 2013-12-04 ENCOUNTER — Encounter (HOSPITAL_COMMUNITY): Payer: Self-pay | Admitting: Emergency Medicine

## 2013-12-04 ENCOUNTER — Emergency Department (HOSPITAL_COMMUNITY): Payer: Medicare Other

## 2013-12-04 DIAGNOSIS — H81399 Other peripheral vertigo, unspecified ear: Secondary | ICD-10-CM | POA: Insufficient documentation

## 2013-12-04 DIAGNOSIS — Z87448 Personal history of other diseases of urinary system: Secondary | ICD-10-CM | POA: Diagnosis not present

## 2013-12-04 DIAGNOSIS — R42 Dizziness and giddiness: Secondary | ICD-10-CM | POA: Diagnosis not present

## 2013-12-04 DIAGNOSIS — J3489 Other specified disorders of nose and nasal sinuses: Secondary | ICD-10-CM | POA: Diagnosis not present

## 2013-12-04 DIAGNOSIS — I10 Essential (primary) hypertension: Secondary | ICD-10-CM | POA: Diagnosis not present

## 2013-12-04 DIAGNOSIS — Z79899 Other long term (current) drug therapy: Secondary | ICD-10-CM | POA: Insufficient documentation

## 2013-12-04 DIAGNOSIS — Z7982 Long term (current) use of aspirin: Secondary | ICD-10-CM | POA: Diagnosis not present

## 2013-12-04 DIAGNOSIS — E119 Type 2 diabetes mellitus without complications: Secondary | ICD-10-CM | POA: Insufficient documentation

## 2013-12-04 DIAGNOSIS — R5381 Other malaise: Secondary | ICD-10-CM | POA: Diagnosis not present

## 2013-12-04 DIAGNOSIS — Z792 Long term (current) use of antibiotics: Secondary | ICD-10-CM | POA: Insufficient documentation

## 2013-12-04 DIAGNOSIS — S298XXA Other specified injuries of thorax, initial encounter: Secondary | ICD-10-CM | POA: Diagnosis not present

## 2013-12-04 HISTORY — DX: Type 2 diabetes mellitus without complications: E11.9

## 2013-12-04 HISTORY — DX: Disorder of kidney and ureter, unspecified: N28.9

## 2013-12-04 HISTORY — DX: Essential (primary) hypertension: I10

## 2013-12-04 LAB — BASIC METABOLIC PANEL
CO2: 22 mEq/L (ref 19–32)
Calcium: 9.6 mg/dL (ref 8.4–10.5)
Creatinine, Ser: 1.75 mg/dL — ABNORMAL HIGH (ref 0.50–1.10)
GFR calc Af Amer: 32 mL/min — ABNORMAL LOW (ref >90)
GFR calc non Af Amer: 28 mL/min — ABNORMAL LOW (ref >90)
Sodium: 138 mEq/L (ref 135–145)

## 2013-12-04 LAB — URINALYSIS W MICROSCOPIC + REFLEX CULTURE
Glucose, UA: NEGATIVE mg/dL
Ketones, ur: NEGATIVE mg/dL
Nitrite: NEGATIVE
Protein, ur: 30 mg/dL — AB

## 2013-12-04 LAB — CBC
MCH: 24.7 pg — ABNORMAL LOW (ref 26.0–34.0)
MCHC: 32.1 g/dL (ref 30.0–36.0)
MCV: 77 fL — ABNORMAL LOW (ref 78.0–100.0)
Platelets: 202 10*3/uL (ref 150–400)
RBC: 4.17 MIL/uL (ref 3.87–5.11)
RDW: 14.8 % (ref 11.5–15.5)

## 2013-12-04 LAB — GLUCOSE, CAPILLARY: Glucose-Capillary: 83 mg/dL (ref 70–99)

## 2013-12-04 LAB — TROPONIN I: Troponin I: <0.3 ng/mL (ref <0.30)

## 2013-12-04 MED ORDER — SODIUM CHLORIDE 0.9 % IV BOLUS (SEPSIS)
500.0000 mL | Freq: Once | INTRAVENOUS | Status: AC
  Administered 2013-12-04: 500 mL via INTRAVENOUS

## 2013-12-04 MED ORDER — SODIUM CHLORIDE 0.9 % IV SOLN
INTRAVENOUS | Status: DC
  Administered 2013-12-04: 14:00:00 via INTRAVENOUS

## 2013-12-04 MED ORDER — MECLIZINE HCL 25 MG PO TABS: 25.0000 mg | ORAL_TABLET | Freq: Three times a day (TID) | ORAL | Status: DC | PRN

## 2013-12-04 MED ORDER — MECLIZINE HCL 25 MG PO TABS
25.0000 mg | ORAL_TABLET | Freq: Once | ORAL | Status: AC
  Administered 2013-12-04: 25 mg via ORAL
  Filled 2013-12-04: qty 1

## 2013-12-04 MED ORDER — ONDANSETRON HCL 4 MG PO TABS: 4.0000 mg | ORAL_TABLET | Freq: Three times a day (TID) | ORAL | Status: DC | PRN

## 2013-12-04 NOTE — ED Provider Notes (Signed)
CSN: [id number]     Arrival date & time 12/04/13  [id number] History   First MD Initiated Contact with Patient 12/04/13 1117     Chief Complaint  Patient presents with  . Dizziness    HPI Pt was seen at 1130. Per pt, c/o sudden onset and persistence of multiple intermittent episodes of "dizziness" for the past 2 days. Pt describes the dizziness as "everything is spinning to the right" and she feels "off balance" when she walks. Has been associated with runny/stuffy nose and sinus congestion for the past 1 week. Denies abd pain, no N/V/D, no CP/palpitations, no SOB/cough, no neck pain, no fevers, no headache, no rash, no visual changes, no focal motor weakness, no tingling/numbness in extremities, no ataxia, no slurred speech, no facial droop.     Past Medical History  Diagnosis Date  . Diabetes mellitus without complication   . Hypertension   . Renal disorder    History reviewed. No pertinent past surgical history.  History  Substance Use Topics  . Smoking status: Never Smoker   . Smokeless tobacco: Not on file  . Alcohol Use: No    Review of Systems ROS: Statement: All systems negative except as marked or noted in the HPI; Constitutional: Negative for fever and chills. ; ; Eyes: Negative for eye pain, redness and discharge. ; ; ENMT: Negative for ear pain, hoarseness, sore throat. +nasal congestion, sinus pressure. ; ; Cardiovascular: Negative for chest pain, palpitations, diaphoresis, dyspnea and peripheral edema. ; ; Respiratory: Negative for cough, wheezing and stridor. ; ; Gastrointestinal: Negative for nausea, vomiting, diarrhea, abdominal pain, blood in stool, hematemesis, jaundice and rectal bleeding. . ; ; Genitourinary: Negative for dysuria, flank pain and hematuria. ; ; Musculoskeletal: Negative for back pain and neck pain. Negative for swelling and trauma.; ; Skin: Negative for pruritus, rash, abrasions, blisters, bruising and skin lesion.; ; Neuro: +"dizziness." Negative for  headache, lightheadedness and neck stiffness. Negative for weakness, altered level of consciousness , altered mental status, extremity weakness, paresthesias, involuntary movement, seizure and syncope.       Allergies  Atenolol; Benazepril hcl; Furosemide; and Nsaids  Home Medications   Current Outpatient Rx  Name  Route  Sig  Dispense  Refill  . amLODipine (NORVASC) 10 MG tablet   Oral   Take 10 mg by mouth daily.         Marland Kitchen aspirin EC 81 MG tablet   Oral   Take 81 mg by mouth daily.         . carvedilol (COREG) 25 MG tablet   Oral   Take 25 mg by mouth 2 (two) times daily with a meal.         . cloNIDine (CATAPRES - DOSED IN MG/24 HR) 0.3 mg/24hr patch   Transdermal   Place 0.3 mg onto the skin once a week.         . ferrous sulfate 325 (65 FE) MG tablet   Oral   Take 325 mg by mouth daily with breakfast.         . HYDROcodone-acetaminophen (NORCO/VICODIN) 5-325 MG per tablet   Oral   Take 1 tablet by mouth every 6 (six) hours as needed for moderate pain.         Marland Kitchen omeprazole (PRILOSEC) 20 MG capsule   Oral   Take 20 mg by mouth daily.         Marland Kitchen amoxicillin (AMOXIL) 500 MG capsule   Oral   Take 1,000 mg  by mouth 2 (two) times daily.         . clarithromycin (BIAXIN) 500 MG tablet   Oral   Take 500 mg by mouth 2 (two) times daily.          BP 122/58  Pulse 64  Temp(Src) 98.4 F (36.9 C) (Oral)  Resp 18  Ht 5\' 9"  (1.753 m)  Wt 154 lb 9.6 oz (70.126 kg)  BMI 22.82 kg/m2  SpO2 100% Physical Exam 1135: Physical examination:  Nursing notes reviewed; Vital signs and O2 SAT reviewed;  Constitutional: Well developed, Well nourished, Well hydrated, In no acute distress; Head:  Normocephalic, atraumatic; Eyes: EOMI, PERRL, No scleral icterus; ENMT: TM's clear bilat. +edemetous nasal turbinates bilat with clear rhinorrhea. Mouth and pharynx normal, Mucous membranes moist; Neck: Supple, Full range of motion, No lymphadenopathy; Cardiovascular: Regular  rate and rhythm, No gallop; Respiratory: Breath sounds clear & equal bilaterally, No wheezes.  Speaking full sentences with ease, Normal respiratory effort/excursion; Chest: Nontender, Movement normal; Abdomen: Soft, Nontender, Nondistended, Normal bowel sounds; Genitourinary: No CVA tenderness; Extremities: Pulses normal, No tenderness, No edema, No calf edema or asymmetry.; Neuro: AA&Ox3, Major CN grossly intact.Speech clear.  No facial droop. +left horizontal end gaze fatigable nystagmus. Grips equal. Strength 5/5 equal bilat UE's and LE's.  DTR 2/4 equal bilat UE's and LE's.  No gross sensory deficits.  Normal cerebellar testing bilat UE's (finger-nose) and LE's (heel-shin).; Skin: Color normal, Warm, Dry.   ED Course  Procedures    EKG Interpretation    Date/Time:  Saturday December 04 2013 09:43:08 EST Ventricular Rate:  63 PR Interval:  150 QRS Duration: 88 QT Interval:  386 QTC Calculation: 395 R Axis:   12 Text Interpretation:  Normal sinus rhythm Baseline wander Normal ECG No significant change since last tracing Confirmed by [facility]  MD, Nunzio Cory [phone]) on 12/04/2013 1:05:47 PM            MDM  MDM Reviewed: previous chart, nursing note and vitals Reviewed previous: labs and ECG Interpretation: labs, ECG, x-ray and CT scan     Results for orders placed during the hospital encounter of 12/04/13  CBC      Result Value Range   WBC 6.1  4.0 - 10.5 K/uL   RBC 4.17  3.87 - 5.11 MIL/uL   Hemoglobin 10.3 (*) 12.0 - 15.0 g/dL   HCT 32.1 (*) 36.0 - 46.0 %   MCV 77.0 (*) 78.0 - 100.0 fL   MCH 24.7 (*) 26.0 - 34.0 pg   MCHC 32.1  30.0 - 36.0 g/dL   RDW 14.8  11.5 - 15.5 %   Platelets 202  150 - 400 K/uL  BASIC METABOLIC PANEL      Result Value Range   Sodium 138  135 - 145 mEq/L   Potassium 4.3  3.5 - 5.1 mEq/L   Chloride 105  96 - 112 mEq/L   CO2 22  19 - 32 mEq/L   Glucose, Bld 93  70 - 99 mg/dL   BUN 28 (*) 6 - 23 mg/dL   Creatinine, Ser 1.75 (*) 0.50 - 1.10  mg/dL   Calcium 9.6  8.4 - 10.5 mg/dL   GFR calc non Af Amer 28 (*) >90 mL/min   GFR calc Af Amer 32 (*) >90 mL/min  GLUCOSE, CAPILLARY      Result Value Range   Glucose-Capillary 83  70 - 99 mg/dL   Comment 1 Documented in Chart     Comment 2  Notify RN    TROPONIN I      Result Value Range   Troponin I <0.30  <0.30 ng/mL  URINALYSIS W MICROSCOPIC + REFLEX CULTURE      Result Value Range   Color, Urine YELLOW  YELLOW   APPearance CLOUDY (*) CLEAR   Specific Gravity, Urine 1.012  1.005 - 1.030   pH 5.0  5.0 - 8.0   Glucose, UA NEGATIVE  NEGATIVE mg/dL   Hgb urine dipstick MODERATE (*) NEGATIVE   Bilirubin Urine NEGATIVE  NEGATIVE   Ketones, ur NEGATIVE  NEGATIVE mg/dL   Protein, ur 30 (*) NEGATIVE mg/dL   Urobilinogen, UA 0.2  0.0 - 1.0 mg/dL   Nitrite NEGATIVE  NEGATIVE   Leukocytes, UA TRACE (*) NEGATIVE   WBC, UA 0-2  <3 WBC/hpf   RBC / HPF 0-2  <3 RBC/hpf   Bacteria, UA RARE  RARE   Squamous Epithelial / LPF RARE  RARE   Dg Chest 2 View 12/04/2013   CLINICAL DATA:  Status post fall, history of hypertension and diabetes.  EXAM: CHEST  2 VIEW  COMPARISON:  September 18, 2007.  FINDINGS: The lungs are well-expanded today. There are coarse lung markings projecting over the lower thoracic spine on the lateral film. But no abnormalities demonstrated tear on the frontal film. The findings may be related to overlap of lung parenchyma and pulmonary vessels and mild degenerative disc change here. There is no pleural effusion or pneumothorax. The cardiac silhouette is top-normal in size. The pulmonary vascularity is not engorged. The observed portions of the upper abdomen are grossly normal. The bony thorax is normal for age where visualized.  IMPRESSION: 1. There is no definite evidence of acute thoracic trauma. 2. The lungs are adequately inflated. Coarse lung markings in the retrocardiac region overlying the lower thoracic spine are nonspecific. In the appropriate clinical setting these  could reflect areas of subsegmental atelectasis. They may be accentuated however by degenerative disc change in the adjacent lower lumbar spine.   Electronically Signed   By: David  Martinique   On: 12/04/2013 12:29   Ct Head Wo Contrast 12/04/2013   CLINICAL DATA:  Dizziness  EXAM: CT HEAD WITHOUT CONTRAST  TECHNIQUE: Contiguous axial images were obtained from the base of the skull through the vertex without intravenous contrast.  COMPARISON:  None.  FINDINGS: Mild chronic ischemic changes in the periventricular white matter. Mild global atrophy appropriate to age. No mass effect, midline shift, or acute intracranial hemorrhage. Air-fluid level in the right maxillary sinus. No acute bony deformity. Mastoid air cells are clear.  IMPRESSION: Inflammatory changes in the paranasal sinuses. Chronic ischemic changes.   Electronically Signed   By: Maryclare Bean M.D.   On: 12/04/2013 12:18    1430:  Pt states she feels "much better now" and wants to go home. Neuro exam continues intact/unchanged. Pt not orthostatic. Pt has ambulated around the ED with steady upright gait. Denies any further dizziness, continues to deny CP/SOB. Has tol PO well without N/V. Will tx for peripheral vertigo. Dx and testing d/w pt and family.  Questions answered.  Verb understanding, agreeable to d/c home with outpt f/u.   Alfonzo Feller, DO 12/08/13 1348

## 2013-12-04 NOTE — ED Notes (Signed)
Pt unable to void at this time. 

## 2013-12-04 NOTE — ED Notes (Addendum)
Pt reports she has felt dizzy since Wednesday. States she has fallen several times because she was so dizzy and this am she hit her R forehead when she fell. Denies loc. A&ox4. States her md started her on a new medication last week shes not sure what for

## 2013-12-04 NOTE — ED Notes (Signed)
Patient transported to X-ray 

## 2013-12-04 NOTE — ED Notes (Signed)
Pt given PO fluids, denies N/V.

## 2013-12-04 NOTE — ED Notes (Signed)
Pt ambulated in hall. Pt denies any dizziness or lightheadedness. Pt reports normally when she goes from sitting to standing she gets very unsteady but did not occur when up to ambulate. Pt states she feels like the Meclizine was effective.

## 2013-12-04 NOTE — ED Notes (Signed)
MD at bedside. 

## 2013-12-20 DIAGNOSIS — N183 Chronic kidney disease, stage 3 unspecified: Secondary | ICD-10-CM | POA: Diagnosis not present

## 2013-12-20 DIAGNOSIS — D509 Iron deficiency anemia, unspecified: Secondary | ICD-10-CM | POA: Diagnosis not present

## 2013-12-20 DIAGNOSIS — I129 Hypertensive chronic kidney disease with stage 1 through stage 4 chronic kidney disease, or unspecified chronic kidney disease: Secondary | ICD-10-CM | POA: Diagnosis not present

## 2013-12-22 DIAGNOSIS — E119 Type 2 diabetes mellitus without complications: Secondary | ICD-10-CM | POA: Diagnosis not present

## 2013-12-27 ENCOUNTER — Ambulatory Visit (HOSPITAL_COMMUNITY)
Admission: RE | Admit: 2013-12-27 | Discharge: 2013-12-27 | Disposition: A | Discharge status: Home / Self Care | Payer: Medicare Other | Source: Ambulatory Visit | Attending: Internal Medicine | Admitting: Internal Medicine

## 2013-12-27 DIAGNOSIS — Z1231 Encounter for screening mammogram for malignant neoplasm of breast: Secondary | ICD-10-CM | POA: Diagnosis not present

## 2014-02-23 DIAGNOSIS — N189 Chronic kidney disease, unspecified: Secondary | ICD-10-CM | POA: Diagnosis not present

## 2014-02-23 DIAGNOSIS — IMO0002 Reserved for concepts with insufficient information to code with codable children: Secondary | ICD-10-CM | POA: Diagnosis not present

## 2014-02-23 DIAGNOSIS — I10 Essential (primary) hypertension: Secondary | ICD-10-CM | POA: Diagnosis not present

## 2014-02-23 DIAGNOSIS — D509 Iron deficiency anemia, unspecified: Secondary | ICD-10-CM | POA: Diagnosis not present

## 2014-02-23 DIAGNOSIS — Z780 Asymptomatic menopausal state: Secondary | ICD-10-CM | POA: Diagnosis not present

## 2014-02-23 DIAGNOSIS — E1169 Type 2 diabetes mellitus with other specified complication: Secondary | ICD-10-CM | POA: Diagnosis not present

## 2014-02-23 DIAGNOSIS — E785 Hyperlipidemia, unspecified: Secondary | ICD-10-CM | POA: Diagnosis not present

## 2014-02-23 DIAGNOSIS — K2960 Other gastritis without bleeding: Secondary | ICD-10-CM | POA: Diagnosis not present

## 2014-05-20 DIAGNOSIS — H35349 Macular cyst, hole, or pseudohole, unspecified eye: Secondary | ICD-10-CM | POA: Diagnosis not present

## 2014-05-26 ENCOUNTER — Encounter (INDEPENDENT_AMBULATORY_CARE_PROVIDER_SITE_OTHER): Payer: Medicare Other | Admitting: Ophthalmology

## 2014-05-26 DIAGNOSIS — I10 Essential (primary) hypertension: Secondary | ICD-10-CM

## 2014-05-26 DIAGNOSIS — E1139 Type 2 diabetes mellitus with other diabetic ophthalmic complication: Secondary | ICD-10-CM | POA: Diagnosis not present

## 2014-05-26 DIAGNOSIS — E11319 Type 2 diabetes mellitus with unspecified diabetic retinopathy without macular edema: Secondary | ICD-10-CM

## 2014-05-26 DIAGNOSIS — H35341 Macular cyst, hole, or pseudohole, right eye: Secondary | ICD-10-CM

## 2014-05-26 DIAGNOSIS — H35349 Macular cyst, hole, or pseudohole, unspecified eye: Secondary | ICD-10-CM

## 2014-05-26 DIAGNOSIS — H43819 Vitreous degeneration, unspecified eye: Secondary | ICD-10-CM

## 2014-05-26 DIAGNOSIS — E1165 Type 2 diabetes mellitus with hyperglycemia: Secondary | ICD-10-CM

## 2014-05-26 DIAGNOSIS — H35039 Hypertensive retinopathy, unspecified eye: Secondary | ICD-10-CM

## 2014-05-26 HISTORY — DX: Macular cyst, hole, or pseudohole, right eye: H35.341

## 2014-05-26 NOTE — H&P (Signed)
Megan Stevenson is an 73 y.o. female.   Chief Complaint: loss of vision right eye for one month HPI: Macular hole right eye  Past Medical History  Diagnosis Date  . Diabetes mellitus without complication   . Hypertension   . Renal disorder     No past surgical history on file.  No family history on file. Social History:  reports that she has never smoked. She does not have any smokeless tobacco history on file. She reports that she does not drink alcohol or use illicit drugs.  Allergies:  Allergies  Allergen Reactions  . Atenolol     REACTION: Headaches  . Benazepril Hcl     REACTION: Dizziness  . Furosemide     REACTION: Hypernatremia  . Nsaids     REACTION: Has chronic renal disease    No prescriptions prior to admission    Review of systems otherwise negative  There were no vitals taken for this visit.  Physical exam: Mental status: oriented x3. Eyes: See eye exam associated with this date of surgery in media tab.  Scanned in by scanning center Ears, Nose, Throat: within normal limits Neck: Within Normal limits General: within normal limits Chest: Within normal limits Breast: deferred Heart: Within normal limits Abdomen: Within normal limits GU: deferred Extremities: within normal limits Skin: within normal limits  Assessment/Plan Macular hole, right eye Plan: To [facility] for Pars plana vitrectomy, serum patch, laser, gas injection right eye  Hayden Pedro 05/26/2014, 5:14 PM

## 2014-05-27 DIAGNOSIS — N189 Chronic kidney disease, unspecified: Secondary | ICD-10-CM | POA: Diagnosis not present

## 2014-05-27 DIAGNOSIS — E785 Hyperlipidemia, unspecified: Secondary | ICD-10-CM | POA: Diagnosis not present

## 2014-05-27 DIAGNOSIS — D509 Iron deficiency anemia, unspecified: Secondary | ICD-10-CM | POA: Diagnosis not present

## 2014-05-27 DIAGNOSIS — H3530 Unspecified macular degeneration: Secondary | ICD-10-CM | POA: Diagnosis not present

## 2014-05-27 DIAGNOSIS — M109 Gout, unspecified: Secondary | ICD-10-CM | POA: Diagnosis not present

## 2014-05-27 DIAGNOSIS — I10 Essential (primary) hypertension: Secondary | ICD-10-CM | POA: Diagnosis not present

## 2014-05-27 DIAGNOSIS — E119 Type 2 diabetes mellitus without complications: Secondary | ICD-10-CM | POA: Diagnosis not present

## 2014-05-27 DIAGNOSIS — Z6825 Body mass index (BMI) 25.0-25.9, adult: Secondary | ICD-10-CM | POA: Diagnosis not present

## 2014-06-06 ENCOUNTER — Encounter (HOSPITAL_COMMUNITY): Payer: Self-pay | Admitting: Pharmacy Technician

## 2014-06-08 ENCOUNTER — Encounter (HOSPITAL_COMMUNITY): Payer: Self-pay | Admitting: *Deleted

## 2014-06-13 MED ORDER — GATIFLOXACIN 0.5 % OP SOLN
1.0000 [drp] | OPHTHALMIC | Status: DC | PRN
  Filled 2014-06-13: qty 2.5

## 2014-06-13 MED ORDER — PHENYLEPHRINE HCL 2.5 % OP SOLN
1.0000 [drp] | OPHTHALMIC | Status: DC | PRN
  Filled 2014-06-13: qty 2

## 2014-06-13 MED ORDER — TROPICAMIDE 1 % OP SOLN
1.0000 [drp] | OPHTHALMIC | Status: DC | PRN
  Filled 2014-06-13: qty 3

## 2014-06-13 MED ORDER — CYCLOPENTOLATE HCL 1 % OP SOLN
1.0000 [drp] | OPHTHALMIC | Status: DC | PRN
  Filled 2014-06-13: qty 2

## 2014-06-13 MED ORDER — CEFAZOLIN SODIUM-DEXTROSE 2-3 GM-% IV SOLR
2.0000 g | INTRAVENOUS | Status: AC
  Administered 2014-06-14: 2 g via INTRAVENOUS
  Filled 2014-06-13: qty 50

## 2014-06-14 ENCOUNTER — Ambulatory Visit (HOSPITAL_COMMUNITY): Payer: Medicare Other | Admitting: Anesthesiology

## 2014-06-14 ENCOUNTER — Encounter (HOSPITAL_COMMUNITY): Payer: Medicare Other | Admitting: Anesthesiology

## 2014-06-14 ENCOUNTER — Ambulatory Visit (HOSPITAL_COMMUNITY)
Admission: RE | Admit: 2014-06-14 | Discharge: 2014-06-15 | Disposition: A | Discharge status: Home / Self Care | Payer: Medicare Other | Source: Ambulatory Visit | Attending: Ophthalmology | Admitting: Ophthalmology

## 2014-06-14 ENCOUNTER — Encounter (HOSPITAL_COMMUNITY): Payer: Self-pay | Admitting: *Deleted

## 2014-06-14 ENCOUNTER — Encounter (HOSPITAL_COMMUNITY)
Admission: RE | Disposition: A | Discharge status: Home / Self Care | Payer: Self-pay | Source: Ambulatory Visit | Attending: Ophthalmology

## 2014-06-14 DIAGNOSIS — E119 Type 2 diabetes mellitus without complications: Secondary | ICD-10-CM | POA: Insufficient documentation

## 2014-06-14 DIAGNOSIS — N289 Disorder of kidney and ureter, unspecified: Secondary | ICD-10-CM | POA: Insufficient documentation

## 2014-06-14 DIAGNOSIS — H35341 Macular cyst, hole, or pseudohole, right eye: Secondary | ICD-10-CM

## 2014-06-14 DIAGNOSIS — H35349 Macular cyst, hole, or pseudohole, unspecified eye: Secondary | ICD-10-CM | POA: Diagnosis present

## 2014-06-14 DIAGNOSIS — I10 Essential (primary) hypertension: Secondary | ICD-10-CM | POA: Insufficient documentation

## 2014-06-14 DIAGNOSIS — Z87891 Personal history of nicotine dependence: Secondary | ICD-10-CM | POA: Insufficient documentation

## 2014-06-14 HISTORY — PX: 25 GAUGE PARS PLANA VITRECTOMY WITH 20 GAUGE MVR PORT FOR MACULAR HOLE: SHX6096

## 2014-06-14 HISTORY — PX: PARS PLANA VITRECTOMY W/ REPAIR OF MACULAR HOLE: SHX2170

## 2014-06-14 HISTORY — DX: Macular cyst, hole, or pseudohole, unspecified eye: H35.349

## 2014-06-14 HISTORY — DX: Unspecified osteoarthritis, unspecified site: M19.90

## 2014-06-14 LAB — GLUCOSE, CAPILLARY
GLUCOSE-CAPILLARY: 84 mg/dL (ref 70–99)
GLUCOSE-CAPILLARY: 71 mg/dL (ref 70–99)
Glucose-Capillary: 90 mg/dL (ref 70–99)

## 2014-06-14 LAB — CBC
HCT: 36.2 % (ref 36.0–46.0)
Hemoglobin: 11 g/dL — ABNORMAL LOW (ref 12.0–15.0)
MCH: 23.2 pg — ABNORMAL LOW (ref 26.0–34.0)
MCHC: 30.4 g/dL (ref 30.0–36.0)
MCV: 76.2 fL — AB (ref 78.0–100.0)
PLATELETS: 256 10*3/uL (ref 150–400)
RBC: 4.75 MIL/uL (ref 3.87–5.11)
RDW: 17.6 % — AB (ref 11.5–15.5)
WBC: 5.3 10*3/uL (ref 4.0–10.5)

## 2014-06-14 LAB — BASIC METABOLIC PANEL
ANION GAP: 14 (ref 5–15)
BUN: 27 mg/dL — ABNORMAL HIGH (ref 6–23)
CHLORIDE: 108 meq/L (ref 96–112)
CO2: 24 mEq/L (ref 19–32)
CREATININE: 1.6 mg/dL — AB (ref 0.50–1.10)
Calcium: 9.7 mg/dL (ref 8.4–10.5)
GFR calc non Af Amer: 31 mL/min — ABNORMAL LOW (ref >90)
GFR, EST AFRICAN AMERICAN: 36 mL/min — AB (ref >90)
Glucose, Bld: 93 mg/dL (ref 70–99)
Potassium: 4 mEq/L (ref 3.7–5.3)
SODIUM: 146 meq/L (ref 137–147)

## 2014-06-14 LAB — AUTOLOGOUS SERUM PATCH PREP

## 2014-06-14 SURGERY — 25 GAUGE PARS PLANA VITRECTOMY WITH 20 GAUGE MVR PORT FOR MACULAR HOLE
Anesthesia: General | Site: Eye | Laterality: Right

## 2014-06-14 MED ORDER — MAGNESIUM HYDROXIDE 400 MG/5ML PO SUSP: 15.0000 mL | Freq: Four times a day (QID) | ORAL | Status: DC | PRN

## 2014-06-14 MED ORDER — PHENYLEPHRINE HCL 2.5 % OP SOLN
1.0000 [drp] | OPHTHALMIC | Status: AC | PRN
  Administered 2014-06-14 (×3): 1 [drp] via OPHTHALMIC

## 2014-06-14 MED ORDER — ROCURONIUM BROMIDE 50 MG/5ML IV SOLN
INTRAVENOUS | Status: AC
  Filled 2014-06-14: qty 1

## 2014-06-14 MED ORDER — STERILE WATER FOR IRRIGATION IR SOLN
Status: DC | PRN
  Administered 2014-06-14: 200 mL

## 2014-06-14 MED ORDER — GATIFLOXACIN 0.5 % OP SOLN
1.0000 [drp] | OPHTHALMIC | Status: AC | PRN
  Administered 2014-06-14 (×3): 1 [drp] via OPHTHALMIC

## 2014-06-14 MED ORDER — ROCURONIUM BROMIDE 100 MG/10ML IV SOLN
INTRAVENOUS | Status: DC | PRN
  Administered 2014-06-14: 40 mg via INTRAVENOUS

## 2014-06-14 MED ORDER — LOSARTAN POTASSIUM 50 MG PO TABS
100.0000 mg | ORAL_TABLET | Freq: Every day | ORAL | Status: DC
  Filled 2014-06-14: qty 2

## 2014-06-14 MED ORDER — EPINEPHRINE HCL 1 MG/ML IJ SOLN
INTRAMUSCULAR | Status: AC
  Filled 2014-06-14: qty 1

## 2014-06-14 MED ORDER — CLONIDINE HCL 0.3 MG/24HR TD PTWK
0.3000 mg | MEDICATED_PATCH | TRANSDERMAL | Status: DC
Start: 2014-06-14 — End: 2014-06-15
  Administered 2014-06-14: 0.3 mg via TRANSDERMAL
  Filled 2014-06-14: qty 1

## 2014-06-14 MED ORDER — BSS IO SOLN
INTRAOCULAR | Status: AC
  Filled 2014-06-14: qty 15

## 2014-06-14 MED ORDER — HYDROCODONE-ACETAMINOPHEN 5-325 MG PO TABS
1.0000 | ORAL_TABLET | Freq: Four times a day (QID) | ORAL | Status: DC | PRN
  Administered 2014-06-14: 1 via ORAL
  Filled 2014-06-14: qty 1

## 2014-06-14 MED ORDER — ACETAMINOPHEN 325 MG PO TABS
325.0000 mg | ORAL_TABLET | ORAL | Status: DC | PRN
  Administered 2014-06-14: 650 mg via ORAL

## 2014-06-14 MED ORDER — HYDROCODONE-ACETAMINOPHEN 5-325 MG PO TABS: 1.0000 | ORAL_TABLET | ORAL | Status: DC | PRN

## 2014-06-14 MED ORDER — PROPOFOL 10 MG/ML IV BOLUS
INTRAVENOUS | Status: AC
  Filled 2014-06-14: qty 20

## 2014-06-14 MED ORDER — TETRACAINE HCL 0.5 % OP SOLN
2.0000 [drp] | Freq: Once | OPHTHALMIC | Status: DC
  Filled 2014-06-14: qty 2

## 2014-06-14 MED ORDER — ONDANSETRON HCL 4 MG/2ML IJ SOLN
INTRAMUSCULAR | Status: AC
  Filled 2014-06-14: qty 2

## 2014-06-14 MED ORDER — DEXAMETHASONE SODIUM PHOSPHATE 10 MG/ML IJ SOLN
INTRAMUSCULAR | Status: DC | PRN
  Administered 2014-06-14: 10 mg via INTRAVENOUS

## 2014-06-14 MED ORDER — GLYCOPYRROLATE 0.2 MG/ML IJ SOLN
INTRAMUSCULAR | Status: AC
Start: 2014-06-14 — End: 2014-06-14
  Filled 2014-06-14: qty 2

## 2014-06-14 MED ORDER — BSS PLUS IO SOLN
INTRAOCULAR | Status: AC
  Filled 2014-06-14: qty 500

## 2014-06-14 MED ORDER — BACITRACIN-POLYMYXIN B 500-10000 UNIT/GM OP OINT
TOPICAL_OINTMENT | OPHTHALMIC | Status: DC | PRN
  Administered 2014-06-14: 1 via OPHTHALMIC

## 2014-06-14 MED ORDER — PROMETHAZINE HCL 25 MG/ML IJ SOLN: 6.2500 mg | INTRAMUSCULAR | Status: DC | PRN

## 2014-06-14 MED ORDER — SODIUM HYALURONATE 10 MG/ML IO SOLN
INTRAOCULAR | Status: DC | PRN
  Administered 2014-06-14: 0.85 mL via INTRAOCULAR

## 2014-06-14 MED ORDER — ATROPINE SULFATE 1 % OP SOLN
OPHTHALMIC | Status: DC | PRN
  Administered 2014-06-14: 1 [drp] via OPHTHALMIC

## 2014-06-14 MED ORDER — SODIUM HYALURONATE 10 MG/ML IO SOLN
INTRAOCULAR | Status: AC
  Filled 2014-06-14: qty 0.85

## 2014-06-14 MED ORDER — PROPOFOL 10 MG/ML IV BOLUS
INTRAVENOUS | Status: DC | PRN
  Administered 2014-06-14: 110 mg via INTRAVENOUS

## 2014-06-14 MED ORDER — EPINEPHRINE HCL 1 MG/ML IJ SOLN
INTRAOCULAR | Status: DC | PRN
  Administered 2014-06-14: 11:00:00

## 2014-06-14 MED ORDER — CARVEDILOL 25 MG PO TABS
25.0000 mg | ORAL_TABLET | Freq: Two times a day (BID) | ORAL | Status: DC
  Administered 2014-06-14 – 2014-06-15 (×2): 25 mg via ORAL
  Filled 2014-06-14 (×4): qty 1

## 2014-06-14 MED ORDER — FENTANYL CITRATE 0.05 MG/ML IJ SOLN
INTRAMUSCULAR | Status: DC | PRN
  Administered 2014-06-14: 50 ug via INTRAVENOUS

## 2014-06-14 MED ORDER — LIDOCAINE HCL 4 % MT SOLN
OROMUCOSAL | Status: DC | PRN
  Administered 2014-06-14: 4 mL via TOPICAL

## 2014-06-14 MED ORDER — PREDNISOLONE ACETATE 1 % OP SUSP
1.0000 [drp] | Freq: Four times a day (QID) | OPHTHALMIC | Status: DC
  Filled 2014-06-14: qty 1

## 2014-06-14 MED ORDER — MORPHINE SULFATE 2 MG/ML IJ SOLN: 1.0000 mg | INTRAMUSCULAR | Status: DC | PRN

## 2014-06-14 MED ORDER — HYDROMORPHONE HCL PF 1 MG/ML IJ SOLN
0.2500 mg | INTRAMUSCULAR | Status: DC | PRN
  Administered 2014-06-14: 0.25 mg via INTRAVENOUS

## 2014-06-14 MED ORDER — TRIAMCINOLONE ACETONIDE 40 MG/ML IJ SUSP
INTRAMUSCULAR | Status: AC
  Filled 2014-06-14: qty 5

## 2014-06-14 MED ORDER — DOCUSATE SODIUM 100 MG PO CAPS
100.0000 mg | ORAL_CAPSULE | Freq: Two times a day (BID) | ORAL | Status: DC
  Administered 2014-06-14 (×2): 100 mg via ORAL
  Filled 2014-06-14: qty 1

## 2014-06-14 MED ORDER — BACITRACIN-POLYMYXIN B 500-10000 UNIT/GM OP OINT
1.0000 "application " | TOPICAL_OINTMENT | Freq: Four times a day (QID) | OPHTHALMIC | Status: DC
  Filled 2014-06-14: qty 3.5

## 2014-06-14 MED ORDER — GLYCOPYRROLATE 0.2 MG/ML IJ SOLN
INTRAMUSCULAR | Status: DC | PRN
  Administered 2014-06-14: .4 mg via INTRAVENOUS

## 2014-06-14 MED ORDER — PHENYLEPHRINE HCL 10 MG/ML IJ SOLN
10.0000 mg | INTRAVENOUS | Status: DC | PRN
  Administered 2014-06-14: 10 ug/min via INTRAVENOUS

## 2014-06-14 MED ORDER — GATIFLOXACIN 0.5 % OP SOLN
1.0000 [drp] | Freq: Four times a day (QID) | OPHTHALMIC | Status: DC
  Filled 2014-06-14: qty 2.5

## 2014-06-14 MED ORDER — GENTAMICIN SULFATE 40 MG/ML IJ SOLN
INTRAMUSCULAR | Status: AC
  Filled 2014-06-14: qty 2

## 2014-06-14 MED ORDER — ONDANSETRON HCL 4 MG/2ML IJ SOLN
4.0000 mg | Freq: Four times a day (QID) | INTRAMUSCULAR | Status: DC | PRN
  Filled 2014-06-14: qty 2

## 2014-06-14 MED ORDER — TROPICAMIDE 1 % OP SOLN
1.0000 [drp] | OPHTHALMIC | Status: AC | PRN
  Administered 2014-06-14 (×3): 1 [drp] via OPHTHALMIC

## 2014-06-14 MED ORDER — MECLIZINE HCL 25 MG PO TABS
25.0000 mg | ORAL_TABLET | Freq: Three times a day (TID) | ORAL | Status: DC | PRN
  Filled 2014-06-14: qty 1

## 2014-06-14 MED ORDER — FENTANYL CITRATE 0.05 MG/ML IJ SOLN
INTRAMUSCULAR | Status: AC
  Filled 2014-06-14: qty 5

## 2014-06-14 MED ORDER — LATANOPROST 0.005 % OP SOLN
1.0000 [drp] | Freq: Every day | OPHTHALMIC | Status: DC
  Filled 2014-06-14: qty 2.5

## 2014-06-14 MED ORDER — BACITRACIN-POLYMYXIN B 500-10000 UNIT/GM OP OINT
TOPICAL_OINTMENT | OPHTHALMIC | Status: AC
  Filled 2014-06-14: qty 3.5

## 2014-06-14 MED ORDER — 0.9 % SODIUM CHLORIDE (POUR BTL) OPTIME
TOPICAL | Status: DC | PRN
  Administered 2014-06-14: 200 mL

## 2014-06-14 MED ORDER — HYDROMORPHONE HCL PF 1 MG/ML IJ SOLN
INTRAMUSCULAR | Status: AC
  Filled 2014-06-14: qty 1

## 2014-06-14 MED ORDER — BUPIVACAINE HCL (PF) 0.75 % IJ SOLN
INTRAMUSCULAR | Status: AC
  Filled 2014-06-14: qty 10

## 2014-06-14 MED ORDER — NEOSTIGMINE METHYLSULFATE 10 MG/10ML IV SOLN
INTRAVENOUS | Status: DC | PRN
  Administered 2014-06-14: 3 mg via INTRAVENOUS

## 2014-06-14 MED ORDER — LACTATED RINGERS IV SOLN: INTRAVENOUS | Status: DC | PRN

## 2014-06-14 MED ORDER — AMLODIPINE BESYLATE 10 MG PO TABS
10.0000 mg | ORAL_TABLET | Freq: Every day | ORAL | Status: DC
  Administered 2014-06-14: 10 mg via ORAL
  Filled 2014-06-14: qty 2
  Filled 2014-06-14: qty 1

## 2014-06-14 MED ORDER — OXYCODONE HCL 5 MG PO TABS: 5.0000 mg | ORAL_TABLET | Freq: Once | ORAL | Status: DC | PRN

## 2014-06-14 MED ORDER — BRIMONIDINE TARTRATE 0.2 % OP SOLN
1.0000 [drp] | Freq: Two times a day (BID) | OPHTHALMIC | Status: DC
  Filled 2014-06-14: qty 5

## 2014-06-14 MED ORDER — ACETAZOLAMIDE SODIUM 500 MG IJ SOLR
500.0000 mg | Freq: Once | INTRAMUSCULAR | Status: AC
  Administered 2014-06-15: 500 mg via INTRAVENOUS
  Filled 2014-06-14: qty 500

## 2014-06-14 MED ORDER — LIDOCAINE HCL (CARDIAC) 20 MG/ML IV SOLN
INTRAVENOUS | Status: AC
  Filled 2014-06-14: qty 10

## 2014-06-14 MED ORDER — OXYCODONE HCL 5 MG/5ML PO SOLN: 5.0000 mg | Freq: Once | ORAL | Status: DC | PRN

## 2014-06-14 MED ORDER — SODIUM CHLORIDE 0.9 % IJ SOLN
INTRAMUSCULAR | Status: DC | PRN
  Administered 2014-06-14: 12:00:00

## 2014-06-14 MED ORDER — ACETAMINOPHEN 325 MG PO TABS
ORAL_TABLET | ORAL | Status: AC
  Filled 2014-06-14: qty 2

## 2014-06-14 MED ORDER — CYCLOPENTOLATE HCL 1 % OP SOLN
1.0000 [drp] | OPHTHALMIC | Status: AC | PRN
  Administered 2014-06-14 (×3): 1 [drp] via OPHTHALMIC

## 2014-06-14 MED ORDER — TEMAZEPAM 15 MG PO CAPS: 15.0000 mg | ORAL_CAPSULE | Freq: Every evening | ORAL | Status: DC | PRN

## 2014-06-14 MED ORDER — SODIUM CHLORIDE 0.45 % IV SOLN
INTRAVENOUS | Status: DC
  Administered 2014-06-14: 20 mL/h via INTRAVENOUS

## 2014-06-14 MED ORDER — HEMOSTATIC AGENTS (NO CHARGE) OPTIME
TOPICAL | Status: DC | PRN
  Administered 2014-06-14: 1 via TOPICAL

## 2014-06-14 MED ORDER — DEXAMETHASONE SODIUM PHOSPHATE 10 MG/ML IJ SOLN
INTRAMUSCULAR | Status: AC
  Filled 2014-06-14: qty 1

## 2014-06-14 MED ORDER — SODIUM CHLORIDE 0.9 % IV SOLN
INTRAVENOUS | Status: DC
  Administered 2014-06-14 (×2): via INTRAVENOUS

## 2014-06-14 MED ORDER — POLYMYXIN B SULFATE 500000 UNITS IJ SOLR
INTRAMUSCULAR | Status: AC
  Filled 2014-06-14: qty 1

## 2014-06-14 MED ORDER — BUPIVACAINE HCL (PF) 0.75 % IJ SOLN
INTRAMUSCULAR | Status: DC | PRN
  Administered 2014-06-14: 10 mL

## 2014-06-14 MED ORDER — SODIUM CHLORIDE 0.9 % IJ SOLN
INTRAMUSCULAR | Status: AC
  Filled 2014-06-14: qty 10

## 2014-06-14 MED ORDER — NEOSTIGMINE METHYLSULFATE 10 MG/10ML IV SOLN
INTRAVENOUS | Status: AC
  Filled 2014-06-14: qty 1

## 2014-06-14 MED ORDER — ASPIRIN EC 81 MG PO TBEC
81.0000 mg | DELAYED_RELEASE_TABLET | Freq: Every day | ORAL | Status: DC
  Administered 2014-06-14: 81 mg via ORAL
  Filled 2014-06-14 (×2): qty 1

## 2014-06-14 MED ORDER — ONDANSETRON HCL 4 MG/2ML IJ SOLN
INTRAMUSCULAR | Status: DC | PRN
  Administered 2014-06-14: 4 mg via INTRAVENOUS

## 2014-06-14 MED ORDER — ATROPINE SULFATE 1 % OP SOLN
OPHTHALMIC | Status: AC
  Filled 2014-06-14: qty 2

## 2014-06-14 SURGICAL SUPPLY — 71 items
BLADE 10 SAFETY STRL DISP (BLADE) ×2 IMPLANT
BLADE EYE CATARACT 19 1.4 BEAV (BLADE) IMPLANT
BLADE MVR KNIFE 19G (BLADE) IMPLANT
BLADE MVR KNIFE 20G (BLADE) ×2 IMPLANT
CANNULA ANT CHAM MAIN (OPHTHALMIC RELATED) IMPLANT
CANNULA VLV SOFT TIP 25GA (OPHTHALMIC) ×2 IMPLANT
CORDS BIPOLAR (ELECTRODE) ×2 IMPLANT
COTTONBALL LRG STERILE PKG (GAUZE/BANDAGES/DRESSINGS) ×6 IMPLANT
COVER MAYO STAND STRL (DRAPES) ×2 IMPLANT
DRAPE INCISE 51X51 W/FILM STRL (DRAPES) ×2 IMPLANT
DRAPE OPHTHALMIC 77X100 STRL (CUSTOM PROCEDURE TRAY) ×2 IMPLANT
ERASER HMR WETFIELD 23G BP (MISCELLANEOUS) IMPLANT
FILTER BLUE MILLIPORE (MISCELLANEOUS) IMPLANT
FILTER STRAW FLUID ASPIR (MISCELLANEOUS) ×2 IMPLANT
FORCEPS GRIESHABER ILM 25G A (INSTRUMENTS) ×2 IMPLANT
FORCEPS ILM 25G DSP TIP (MISCELLANEOUS) IMPLANT
GAS AUTO FILL CONSTEL (OPHTHALMIC) ×2
GAS AUTO FILL CONSTELLATION (OPHTHALMIC) ×1 IMPLANT
GAS OPHTHALMIC (MISCELLANEOUS) ×2 IMPLANT
GLOVE BIO SURGEON STRL SZ7 (GLOVE) ×4 IMPLANT
GLOVE BIOGEL PI IND STRL 6.5 (GLOVE) ×1 IMPLANT
GLOVE BIOGEL PI IND STRL 7.0 (GLOVE) ×2 IMPLANT
GLOVE BIOGEL PI INDICATOR 6.5 (GLOVE) ×1
GLOVE BIOGEL PI INDICATOR 7.0 (GLOVE) ×2
GLOVE SS BIOGEL STRL SZ 6.5 (GLOVE) ×1 IMPLANT
GLOVE SS BIOGEL STRL SZ 7 (GLOVE) ×1 IMPLANT
GLOVE SUPERSENSE BIOGEL SZ 6.5 (GLOVE) ×1
GLOVE SUPERSENSE BIOGEL SZ 7 (GLOVE) ×1
GLOVE SURG 8.5 LATEX PF (GLOVE) ×2 IMPLANT
GLOVE SURG SS PI 6.5 STRL IVOR (GLOVE) ×2 IMPLANT
GOWN STRL REUS W/ TWL LRG LVL3 (GOWN DISPOSABLE) ×6 IMPLANT
GOWN STRL REUS W/TWL LRG LVL3 (GOWN DISPOSABLE) ×6
HANDLE PNEUMATIC FOR CONSTEL (OPHTHALMIC) ×2 IMPLANT
KIT BASIN OR (CUSTOM PROCEDURE TRAY) ×2 IMPLANT
KIT ROOM TURNOVER OR (KITS) ×2 IMPLANT
KNIFE GRIESHABER SHARP 2.5MM (MISCELLANEOUS) IMPLANT
MICROPICK 25G (MISCELLANEOUS)
NEEDLE 18GX1X1/2 (RX/OR ONLY) (NEEDLE) ×2 IMPLANT
NEEDLE 25GX 5/8IN NON SAFETY (NEEDLE) ×2 IMPLANT
NEEDLE 27GAX1X1/2 (NEEDLE) IMPLANT
NEEDLE FILTER BLUNT 18X 1/2SAF (NEEDLE)
NEEDLE FILTER BLUNT 18X1 1/2 (NEEDLE) IMPLANT
NEEDLE HYPO 30X.5 LL (NEEDLE) ×6 IMPLANT
NS IRRIG 1000ML POUR BTL (IV SOLUTION) ×2 IMPLANT
PACK FRAGMATOME (OPHTHALMIC) IMPLANT
PACK VITRECTOMY CUSTOM (CUSTOM PROCEDURE TRAY) ×2 IMPLANT
PAD ARMBOARD 7.5X6 YLW CONV (MISCELLANEOUS) ×4 IMPLANT
PAK PIK VITRECTOMY CVS 25GA (OPHTHALMIC) ×2 IMPLANT
PIC ILLUMINATED 25G (OPHTHALMIC) ×2
PICK MICROPICK 25G (MISCELLANEOUS) IMPLANT
PIK ILLUMINATED 25G (OPHTHALMIC) ×1 IMPLANT
PROBE LASER ILLUM FLEX CVD 25G (OPHTHALMIC) ×2 IMPLANT
REPL STRA BRUSH NEEDLE (NEEDLE) ×4 IMPLANT
RESERVOIR BACK FLUSH (MISCELLANEOUS) ×2 IMPLANT
ROLLS DENTAL (MISCELLANEOUS) ×4 IMPLANT
SCRAPER DIAMOND 25GA (OPHTHALMIC RELATED) IMPLANT
SCRAPER DIAMOND DUST MEMBRANE (MISCELLANEOUS) ×2 IMPLANT
SPONGE SURGIFOAM ABS GEL 12-7 (HEMOSTASIS) ×2 IMPLANT
STOPCOCK 4 WAY LG BORE MALE ST (IV SETS) ×2 IMPLANT
SUT CHROMIC 7 0 TG140 8 (SUTURE) IMPLANT
SUT ETHILON 9 0 TG140 8 (SUTURE) ×2 IMPLANT
SUT POLY NON ABSORB 10-0 8 STR (SUTURE) IMPLANT
SUT SILK 4 0 RB 1 (SUTURE) IMPLANT
SYR 20CC LL (SYRINGE) ×2 IMPLANT
SYR 5ML LL (SYRINGE) IMPLANT
SYR BULB 3OZ (MISCELLANEOUS) ×2 IMPLANT
SYR TB 1ML LUER SLIP (SYRINGE) ×2 IMPLANT
SYRINGE 10CC LL (SYRINGE) ×2 IMPLANT
TOWEL OR 17X24 6PK STRL BLUE (TOWEL DISPOSABLE) ×6 IMPLANT
WATER STERILE IRR 1000ML POUR (IV SOLUTION) ×2 IMPLANT
WIPE INSTRUMENT VISIWIPE 73X73 (MISCELLANEOUS) ×2 IMPLANT

## 2014-06-14 NOTE — Transfer of Care (Signed)
Immediate Anesthesia Transfer of Care Note  Patient: Megan Stevenson  Procedure(s) Performed: Procedure(s): 25 GAUGE PARS PLANA VITRECTOMY WITH 20 GAUGE MVR PORT FOR MACULAR HOLE; serum patch; gas injection; laser treament; C3F8 gas fluid exchange. (Right)  Patient Location: PACU  Anesthesia Type:General  Level of Consciousness: awake and oriented  Airway & Oxygen Therapy: Patient Spontanous Breathing and Patient connected to nasal cannula oxygen  Post-op Assessment: Report given to PACU RN, Post -op Vital signs reviewed and stable and Patient moving all extremities  Post vital signs: Reviewed and stable  Complications: No apparent anesthesia complications

## 2014-06-14 NOTE — Brief Op Note (Signed)
Brief Operative note   Preoperative diagnosis:  macular hole right eye Postoperative diagnosis  Post-Op Diagnosis Codes:    * Macular cyst, hole, or pseudohole of retina [362.54]  Procedures: Repair of macular hole with pars plana vtirectomy, membrane peel, laser, serum patch, C3F8 injection right eye  Surgeon:  Hayden Pedro, MD...  Assistant:  Deatra Ina SA    Anesthesia: General  Specimen: none  Estimated blood loss:  1cc  Complications: none  Patient sent to PACU in good condition  Composed by Hayden Pedro MD  Dictation number: [id number]

## 2014-06-14 NOTE — Anesthesia Preprocedure Evaluation (Addendum)
Anesthesia Evaluation  Patient identified by MRN, date of birth, ID band Patient awake    Reviewed: Allergy & Precautions, H&P , NPO status , Patient's Chart, lab work & pertinent test results  History of Anesthesia Complications Negative for: history of anesthetic complications  Airway Mallampati: II TM Distance: >3 FB Neck ROM: Full    Dental  (+) Dental Advisory Given   Pulmonary former smoker,    Pulmonary exam normal       Cardiovascular hypertension,     Neuro/Psych negative neurological ROS  negative psych ROS   GI/Hepatic Neg liver ROS, PUD,   Endo/Other  diabetes  Renal/GU      Musculoskeletal   Abdominal   Peds  Hematology   Anesthesia Other Findings   Reproductive/Obstetrics                          Anesthesia Physical Anesthesia Plan  ASA: III  Anesthesia Plan: General   Post-op Pain Management:    Induction:   Airway Management Planned: Oral ETT  Additional Equipment:   Intra-op Plan:   Post-operative Plan: Extubation in OR  Informed Consent: I have reviewed the patients History and Physical, chart, labs and discussed the procedure including the risks, benefits and alternatives for the proposed anesthesia with the patient or authorized representative who has indicated his/her understanding and acceptance.   Dental advisory given  Plan Discussed with: CRNA, Anesthesiologist and Surgeon  Anesthesia Plan Comments:        Anesthesia Quick Evaluation

## 2014-06-14 NOTE — Anesthesia Procedure Notes (Signed)
Procedure Name: Intubation Date/Time: 06/14/2014 11:47 AM Performed by: Izora Gala Pre-anesthesia Checklist: Patient identified, Emergency Drugs available and Patient being monitored Patient Re-evaluated:Patient Re-evaluated prior to inductionOxygen Delivery Method: Circle system utilized Preoxygenation: Pre-oxygenation with 100% oxygen Intubation Type: IV induction Ventilation: Mask ventilation without difficulty Laryngoscope Size: Miller and 3 Grade View: Grade I Tube size: 7.5 mm Number of attempts: 1 Airway Equipment and Method: Stylet and LTA kit utilized Placement Confirmation: ETT inserted through vocal cords under direct vision,  breath sounds checked- equal and bilateral and positive ETCO2 Secured at: 22 cm Tube secured with: Tape Dental Injury: Teeth and Oropharynx as per pre-operative assessment

## 2014-06-14 NOTE — Addendum Note (Signed)
Addendum created 06/14/14 1549 by Izora Gala, CRNA   Modules edited: Anesthesia Medication Administration

## 2014-06-14 NOTE — H&P (Signed)
I examined the patient today and there is no change in the medical status 

## 2014-06-14 NOTE — Anesthesia Postprocedure Evaluation (Signed)
Anesthesia Post Note  Patient: Megan Stevenson  Procedure(s) Performed: Procedure(s) (LRB): 25 GAUGE PARS PLANA VITRECTOMY WITH 20 GAUGE MVR PORT FOR MACULAR HOLE; serum patch; gas injection; laser treament; C3F8 gas fluid exchange. (Right)  Anesthesia type: general  Patient location: PACU  Post pain: Pain level controlled  Post assessment: Patient's Cardiovascular Status Stable  Last Vitals:  Filed Vitals:   06/14/14 1345  BP: 154/55  Pulse: 53  Temp:   Resp: 18    Post vital signs: Reviewed and stable  Level of consciousness: sedated  Complications: No apparent anesthesia complications

## 2014-06-15 ENCOUNTER — Encounter (HOSPITAL_COMMUNITY): Payer: Self-pay | Admitting: Ophthalmology

## 2014-06-15 DIAGNOSIS — H35349 Macular cyst, hole, or pseudohole, unspecified eye: Secondary | ICD-10-CM | POA: Diagnosis not present

## 2014-06-15 MED ORDER — BACITRACIN-POLYMYXIN B 500-10000 UNIT/GM OP OINT: 1.0000 "application " | TOPICAL_OINTMENT | Freq: Three times a day (TID) | OPHTHALMIC | Status: DC

## 2014-06-15 MED ORDER — PREDNISOLONE ACETATE 1 % OP SUSP: 1.0000 [drp] | Freq: Four times a day (QID) | OPHTHALMIC | Status: DC

## 2014-06-15 MED ORDER — HYDROCODONE-ACETAMINOPHEN 5-325 MG PO TABS: 1.0000 | ORAL_TABLET | Freq: Four times a day (QID) | ORAL | Status: DC | PRN

## 2014-06-15 MED ORDER — GATIFLOXACIN 0.5 % OP SOLN: 1.0000 [drp] | Freq: Four times a day (QID) | OPHTHALMIC | Status: DC

## 2014-06-15 NOTE — Op Note (Signed)
NAMEBREUNA, Megan Stevenson NO.:  [id number]  MEDICAL RECORD NO.:  [id number]  LOCATION:  6N03C                        FACILITY:  [facility]  PHYSICIAN:  Chrystie Nose. Zigmund Daniel, M.D. DATE OF BIRTH:  1941-09-08  DATE OF PROCEDURE:  06/14/2014 DATE OF DISCHARGE:                              OPERATIVE REPORT   ADMISSION DIAGNOSIS:  Macular hole, right eye.  PROCEDURES:  Pars plana vitrectomy, retinal photocoagulation for retinal breaks, membrane peel, serum patch, gas fluid exchange, removal of the internal limiting membrane, all in the right eye.  SURGEON:  Chrystie Nose. Zigmund Daniel, M.D.  ASSISTANT:  Deatra Ina, SA.  ANESTHESIA:  General.  DETAILS:  After usual prep and drape, the indirect ophthalmoscope laser was moved into place.  A 395 burns was placed around the retinal periphery with a power of 400 mW and 1000 microns each and 0.05 seconds each.  A 2 retinal breaks were seen and these were treated with laser. The attention was then carried to the pars plana area where 25-gauge trocars were placed at 8 o'clock and 10 o'clock.  A 20-gauge MVR incision was made in the sclera at 2 o'clock.  Diathermy was used to cauterize the sclera.  Then, a 3 layered diamond blade incision was made.  The final layer was made with MVR blade.  The Sapphire lens ring was then placed with sutures at 6 and 12 o'clock.  Provisc was placed on the corneal surface and the flat contact lens was placed.  Pars plana vitrectomy was then begun just behind the crystalline lens.  White membranes were encountered.  These were carefully removed under low suction and rapid cutting.  The vitrectomy was carried posteriorly where additional posterior vitreous was encountered.  This was removed with low suction and rapid cutting.  The silicone tip suction line was drawn down towards the macular surface and the fish strike sign occurred. Posterior vitreous was peeled with the silicone tip suction line from the edges  of the macular hole.  The lighted pick was used to engage the membranes and stripped them from their surface of the retina.  The vitreous cutter was reintroduced in the eye and 30-degree prismatic lens was used for viewing in the periphery.  The peripheral vitreous was removed down to the vitreous base for 360 degrees.  Once this was accomplished, the magnifying contact lens was placed on the lateral Provisc and on the cornea.  The diamond-dusted membrane scraper was used to engage the internal limiting membrane and free it from its attachment around the macular hole.  Approximately 1 disc diameter of membrane was removed around the macular hole.  The edge of the macular hole were teased with the diamond-dusted membrane scraper until they were freed. Additional vitreous cutting was performed to remove all debris.  A total gas fluid exchange was performed.  Additional time was allowed for the fluid to track down the walls of the eye collected in the posterior segment.  Serum patch was prepared during this time, and C3F8 in a 15% concentration was prepared.  Serum patch was delivered.  Additional fluid was removed from the posterior segment.  The C3F8 15% mixture was exchanged for intravitreal  gas.  The instruments were removed from the eye, and the 25-gauge trocars were removed.  A  one 9-0 suture was used to close the sclerotomy site at 2 o'clock.  The conjunctiva was closed with wet-field cautery.  Polymyxin and gentamicin were irrigated into tenon space.  Atropine solution was applied.  Marcaine was injected around the globe for postop pain.  Decadron 10 mg was injected into the lower subconjunctival space.  Closing pressure was 10 with a Barraquer tonometer.  Complications none.  Duration 1 hour.  Polysporin patch and shield were placed.  The patient was awakened and taken to recovery in satisfactory condition.     Chrystie Nose. Zigmund Daniel, M.D.     JDM/MEDQ  D:  06/14/2014  T:   06/15/2014  Job:  [id number]

## 2014-06-15 NOTE — Progress Notes (Signed)
06/15/2014, 6:37 AM  Mental Status:  Awake, Alert, Oriented  Anterior segment: Cornea  Clear    Anterior Chamber Clear    Lens:    IOL  Intra Ocular Pressure 17 mmHg with Tonopen  Vitreous: Clear 95%gas bubble   Retina:  Attached Good laser reaction   Impression: Excellent result Retina attached Poor view  Final Diagnosis: Principal Problem:   Macular hole, right eye Active Problems:   Macular hole   Plan: start post operative eye drops.  Discharge to home.  Give post operative instructions  Hayden Pedro 06/15/2014, 6:37 AM

## 2014-06-15 NOTE — Discharge Summary (Signed)
Discharge summary not needed on [facility] patients per medical records. 

## 2014-06-15 NOTE — Progress Notes (Signed)
Discharge education completed by RN. Pt received a copy of discharge paperwork and confirms understanding of follow up appointments and discharge medications. Pt denies any questions at this time. IV removed, site is within normal limits. Pt will discharge from the unit via wheelchair.

## 2014-06-20 ENCOUNTER — Encounter (INDEPENDENT_AMBULATORY_CARE_PROVIDER_SITE_OTHER): Payer: Medicare Other | Admitting: Ophthalmology

## 2014-06-20 DIAGNOSIS — H35349 Macular cyst, hole, or pseudohole, unspecified eye: Secondary | ICD-10-CM

## 2014-07-04 DIAGNOSIS — I10 Essential (primary) hypertension: Secondary | ICD-10-CM | POA: Diagnosis not present

## 2014-07-04 DIAGNOSIS — N183 Chronic kidney disease, stage 3 unspecified: Secondary | ICD-10-CM | POA: Diagnosis not present

## 2014-07-13 ENCOUNTER — Encounter (INDEPENDENT_AMBULATORY_CARE_PROVIDER_SITE_OTHER): Payer: Medicare Other | Admitting: Ophthalmology

## 2014-07-13 DIAGNOSIS — H35349 Macular cyst, hole, or pseudohole, unspecified eye: Secondary | ICD-10-CM

## 2014-07-13 DIAGNOSIS — H33009 Unspecified retinal detachment with retinal break, unspecified eye: Secondary | ICD-10-CM | POA: Diagnosis not present

## 2014-07-14 DIAGNOSIS — H33001 Unspecified retinal detachment with retinal break, right eye: Secondary | ICD-10-CM

## 2014-07-14 HISTORY — DX: Unspecified retinal detachment with retinal break, right eye: H33.001

## 2014-07-14 NOTE — H&P (Signed)
Megan Stevenson is an 73 y.o. female.   Chief Complaint:Loss of visual field right eye  HPI: Patient had macular hole surgery 06-14-14.  Found inferior retinal detachment on post op visit. Right eye  Past Medical History  Diagnosis Date  . Diabetes mellitus without complication   . Hypertension   . Renal disorder   . Arthritis   . Macular hole     OD    Past Surgical History  Procedure Laterality Date  . Abdominal hysterectomy    . Pars plana vitrectomy w/ repair of macular hole Right 06/14/2014    DR Jameya Pontiff  . 25 gauge pars plana vitrectomy with 20 gauge mvr port for macular hole Right 06/14/2014    Procedure: 25 GAUGE PARS PLANA VITRECTOMY WITH 20 GAUGE MVR PORT FOR MACULAR HOLE; serum patch; gas injection; laser treament; C3F8 gas fluid exchange.;  Surgeon: Hayden Pedro, MD;  Location: [facility];  Service: Ophthalmology;  Laterality: Right;    No family history on file. Social History:  reports that she quit smoking about 20 years ago. Her smoking use included Cigarettes. She has a .75 pack-year smoking history. She has never used smokeless tobacco. She reports that she does not drink alcohol or use illicit drugs.  Allergies:  Allergies  Allergen Reactions  . Atenolol     REACTION: Headaches  . Benazepril Hcl     REACTION: Dizziness  . Furosemide     REACTION: Hypernatremia  . Nsaids     REACTION: Has chronic renal disease    No prescriptions prior to admission    Review of systems otherwise negative  There were no vitals taken for this visit.  Physical exam: Mental status: oriented x3. Eyes: See eye exam associated with this date of surgery in media tab.  Scanned in by scanning center Ears, Nose, Throat: within normal limits Neck: Within Normal limits General: within normal limits Chest: Within normal limits Breast: deferred Heart: Within normal limits Abdomen: Within normal limits GU: deferred Extremities: within normal limits Skin: within normal  limits  Assessment/Plan Rhegmatogenous retinal detachment right eye Plan: To [facility] for Scleral buckle with possible pars plana vitrectomy and laser right eye  [name], Chrystie Nose 07/14/2014, 11:04 AM

## 2014-07-18 ENCOUNTER — Encounter (HOSPITAL_COMMUNITY): Payer: Self-pay | Admitting: *Deleted

## 2014-07-18 ENCOUNTER — Encounter (HOSPITAL_COMMUNITY): Payer: Self-pay | Admitting: Pharmacy Technician

## 2014-07-18 MED ORDER — CYCLOPENTOLATE HCL 1 % OP SOLN: 1.0000 [drp] | OPHTHALMIC | Status: DC | PRN

## 2014-07-18 MED ORDER — CEFAZOLIN SODIUM-DEXTROSE 2-3 GM-% IV SOLR
2.0000 g | INTRAVENOUS | Status: AC
  Administered 2014-07-19: 2 g via INTRAVENOUS
  Filled 2014-07-18: qty 50

## 2014-07-18 MED ORDER — PHENYLEPHRINE HCL 2.5 % OP SOLN: 1.0000 [drp] | OPHTHALMIC | Status: DC | PRN

## 2014-07-18 MED ORDER — GATIFLOXACIN 0.5 % OP SOLN: 1.0000 [drp] | OPHTHALMIC | Status: DC | PRN

## 2014-07-18 MED ORDER — TROPICAMIDE 1 % OP SOLN: 1.0000 [drp] | OPHTHALMIC | Status: DC | PRN

## 2014-07-19 ENCOUNTER — Ambulatory Visit (HOSPITAL_COMMUNITY): Payer: Medicare Other | Admitting: Anesthesiology

## 2014-07-19 ENCOUNTER — Encounter (HOSPITAL_COMMUNITY)
Admission: RE | Disposition: A | Discharge status: Home / Self Care | Payer: Self-pay | Source: Ambulatory Visit | Attending: Ophthalmology

## 2014-07-19 ENCOUNTER — Encounter (HOSPITAL_COMMUNITY): Payer: Medicare Other | Admitting: Anesthesiology

## 2014-07-19 ENCOUNTER — Encounter (HOSPITAL_COMMUNITY): Payer: Self-pay | Admitting: *Deleted

## 2014-07-19 ENCOUNTER — Ambulatory Visit (HOSPITAL_COMMUNITY)
Admission: RE | Admit: 2014-07-19 | Discharge: 2014-07-20 | Disposition: A | Discharge status: Home / Self Care | Payer: Medicare Other | Source: Ambulatory Visit | Attending: Ophthalmology | Admitting: Ophthalmology

## 2014-07-19 DIAGNOSIS — H35349 Macular cyst, hole, or pseudohole, unspecified eye: Secondary | ICD-10-CM | POA: Insufficient documentation

## 2014-07-19 DIAGNOSIS — Z888 Allergy status to other drugs, medicaments and biological substances status: Secondary | ICD-10-CM | POA: Insufficient documentation

## 2014-07-19 DIAGNOSIS — H33009 Unspecified retinal detachment with retinal break, unspecified eye: Secondary | ICD-10-CM | POA: Diagnosis not present

## 2014-07-19 DIAGNOSIS — M1990 Unspecified osteoarthritis, unspecified site: Secondary | ICD-10-CM | POA: Diagnosis not present

## 2014-07-19 DIAGNOSIS — E119 Type 2 diabetes mellitus without complications: Secondary | ICD-10-CM | POA: Diagnosis not present

## 2014-07-19 DIAGNOSIS — M129 Arthropathy, unspecified: Secondary | ICD-10-CM | POA: Diagnosis not present

## 2014-07-19 DIAGNOSIS — I10 Essential (primary) hypertension: Secondary | ICD-10-CM | POA: Diagnosis not present

## 2014-07-19 DIAGNOSIS — H33001 Unspecified retinal detachment with retinal break, right eye: Secondary | ICD-10-CM

## 2014-07-19 HISTORY — PX: SCLERAL BUCKLE: SHX5340

## 2014-07-19 HISTORY — PX: LASER PHOTO ABLATION: SHX5942

## 2014-07-19 HISTORY — DX: Cardiac arrhythmia, unspecified: I49.9

## 2014-07-19 HISTORY — DX: Anemia, unspecified: D64.9

## 2014-07-19 HISTORY — PX: GAS INSERTION: SHX5336

## 2014-07-19 LAB — CBC
HCT: 34.2 % — ABNORMAL LOW (ref 36.0–46.0)
Hemoglobin: 10.7 g/dL — ABNORMAL LOW (ref 12.0–15.0)
MCH: 24.4 pg — AB (ref 26.0–34.0)
MCHC: 31.3 g/dL (ref 30.0–36.0)
MCV: 77.9 fL — ABNORMAL LOW (ref 78.0–100.0)
PLATELETS: 195 10*3/uL (ref 150–400)
RBC: 4.39 MIL/uL (ref 3.87–5.11)
RDW: 17.2 % — AB (ref 11.5–15.5)
WBC: 4.3 10*3/uL (ref 4.0–10.5)

## 2014-07-19 LAB — BASIC METABOLIC PANEL
Anion gap: 11 (ref 5–15)
BUN: 31 mg/dL — AB (ref 6–23)
CALCIUM: 9.6 mg/dL (ref 8.4–10.5)
CO2: 23 mEq/L (ref 19–32)
Chloride: 112 mEq/L (ref 96–112)
Creatinine, Ser: 1.79 mg/dL — ABNORMAL HIGH (ref 0.50–1.10)
GFR, EST AFRICAN AMERICAN: 31 mL/min — AB (ref >90)
GFR, EST NON AFRICAN AMERICAN: 27 mL/min — AB (ref >90)
GLUCOSE: 82 mg/dL (ref 70–99)
POTASSIUM: 4.5 meq/L (ref 3.7–5.3)
Sodium: 146 mEq/L (ref 137–147)

## 2014-07-19 LAB — GLUCOSE, CAPILLARY
GLUCOSE-CAPILLARY: 73 mg/dL (ref 70–99)
GLUCOSE-CAPILLARY: 70 mg/dL (ref 70–99)
Glucose-Capillary: 76 mg/dL (ref 70–99)

## 2014-07-19 SURGERY — SCLERAL BUCKLE
Anesthesia: General | Site: Eye | Laterality: Right

## 2014-07-19 MED ORDER — BUPIVACAINE HCL (PF) 0.75 % IJ SOLN
INTRAMUSCULAR | Status: AC
  Filled 2014-07-19: qty 10

## 2014-07-19 MED ORDER — CYCLOPENTOLATE HCL 1 % OP SOLN
1.0000 [drp] | OPHTHALMIC | Status: DC
  Administered 2014-07-19 (×2): 1 [drp] via OPHTHALMIC

## 2014-07-19 MED ORDER — ACETAZOLAMIDE SODIUM 500 MG IJ SOLR
500.0000 mg | Freq: Once | INTRAMUSCULAR | Status: AC
  Administered 2014-07-20: 500 mg via INTRAVENOUS
  Filled 2014-07-19: qty 500

## 2014-07-19 MED ORDER — BACITRACIN-POLYMYXIN B 500-10000 UNIT/GM OP OINT
1.0000 "application " | TOPICAL_OINTMENT | Freq: Four times a day (QID) | OPHTHALMIC | Status: DC
  Administered 2014-07-20: 1 via OPHTHALMIC
  Filled 2014-07-19 (×2): qty 3.5

## 2014-07-19 MED ORDER — FENTANYL CITRATE 0.05 MG/ML IJ SOLN
INTRAMUSCULAR | Status: AC
  Administered 2014-07-19: 25 ug via INTRAVENOUS
  Filled 2014-07-19: qty 2

## 2014-07-19 MED ORDER — LATANOPROST 0.005 % OP SOLN
1.0000 [drp] | Freq: Every day | OPHTHALMIC | Status: DC
  Filled 2014-07-19: qty 2.5

## 2014-07-19 MED ORDER — TROPICAMIDE 1 % OP SOLN
1.0000 [drp] | OPHTHALMIC | Status: DC
  Administered 2014-07-19 (×2): 1 [drp] via OPHTHALMIC

## 2014-07-19 MED ORDER — HYDROCODONE-ACETAMINOPHEN 5-325 MG PO TABS: 1.0000 | ORAL_TABLET | Freq: Four times a day (QID) | ORAL | Status: DC | PRN

## 2014-07-19 MED ORDER — GATIFLOXACIN 0.5 % OP SOLN: 1.0000 [drp] | Freq: Four times a day (QID) | OPHTHALMIC | Status: DC

## 2014-07-19 MED ORDER — LIDOCAINE HCL (CARDIAC) 20 MG/ML IV SOLN
INTRAVENOUS | Status: DC | PRN
  Administered 2014-07-19: 60 mg via INTRAVENOUS

## 2014-07-19 MED ORDER — POLYMYXIN B SULFATE 500000 UNITS IJ SOLR
INTRAMUSCULAR | Status: AC
  Filled 2014-07-19: qty 1

## 2014-07-19 MED ORDER — CYCLOPENTOLATE HCL 1 % OP SOLN
OPHTHALMIC | Status: AC
  Administered 2014-07-19: 10:00:00
  Filled 2014-07-19: qty 2

## 2014-07-19 MED ORDER — ACETAMINOPHEN 325 MG PO TABS: 325.0000 mg | ORAL_TABLET | ORAL | Status: DC | PRN

## 2014-07-19 MED ORDER — CARVEDILOL 25 MG PO TABS
25.0000 mg | ORAL_TABLET | Freq: Two times a day (BID) | ORAL | Status: DC
  Administered 2014-07-19 – 2014-07-20 (×2): 25 mg via ORAL
  Filled 2014-07-19 (×4): qty 1

## 2014-07-19 MED ORDER — ONDANSETRON HCL 4 MG/2ML IJ SOLN
INTRAMUSCULAR | Status: DC | PRN
  Administered 2014-07-19: 4 mg via INTRAVENOUS

## 2014-07-19 MED ORDER — SODIUM CHLORIDE 0.9 % IV SOLN
INTRAVENOUS | Status: DC
Start: 2014-07-19 — End: 2014-07-19
  Administered 2014-07-19 (×2): via INTRAVENOUS

## 2014-07-19 MED ORDER — ONDANSETRON HCL 4 MG/2ML IJ SOLN: 4.0000 mg | Freq: Four times a day (QID) | INTRAMUSCULAR | Status: DC | PRN

## 2014-07-19 MED ORDER — DEXAMETHASONE SODIUM PHOSPHATE 10 MG/ML IJ SOLN
INTRAMUSCULAR | Status: DC | PRN
  Administered 2014-07-19: 10 mg

## 2014-07-19 MED ORDER — ASPIRIN EC 81 MG PO TBEC
81.0000 mg | DELAYED_RELEASE_TABLET | Freq: Every day | ORAL | Status: DC
  Filled 2014-07-19: qty 1

## 2014-07-19 MED ORDER — ARTIFICIAL TEARS OP OINT
TOPICAL_OINTMENT | OPHTHALMIC | Status: AC
  Filled 2014-07-19: qty 3.5

## 2014-07-19 MED ORDER — 0.9 % SODIUM CHLORIDE (POUR BTL) OPTIME
TOPICAL | Status: DC | PRN
  Administered 2014-07-19: 1000 mL

## 2014-07-19 MED ORDER — PHENYLEPHRINE HCL 2.5 % OP SOLN
OPHTHALMIC | Status: AC
  Administered 2014-07-19: 10:00:00
  Filled 2014-07-19: qty 2

## 2014-07-19 MED ORDER — ATROPINE SULFATE 1 % OP SOLN
OPHTHALMIC | Status: DC | PRN
  Administered 2014-07-19: 1 [drp] via OPHTHALMIC

## 2014-07-19 MED ORDER — DEXTROSE 5 % IV SOLN
INTRAVENOUS | Status: DC | PRN
  Administered 2014-07-19: 13:00:00 via INTRAVENOUS

## 2014-07-19 MED ORDER — SODIUM CHLORIDE 0.45 % IV SOLN
INTRAVENOUS | Status: DC
  Administered 2014-07-19: 17:00:00 via INTRAVENOUS

## 2014-07-19 MED ORDER — FENTANYL CITRATE 0.05 MG/ML IJ SOLN
INTRAMUSCULAR | Status: AC
  Filled 2014-07-19: qty 5

## 2014-07-19 MED ORDER — BUPIVACAINE HCL (PF) 0.75 % IJ SOLN
INTRAMUSCULAR | Status: DC | PRN
Start: 2014-07-19 — End: 2014-07-19
  Administered 2014-07-19: 10 mL

## 2014-07-19 MED ORDER — AMLODIPINE BESYLATE 10 MG PO TABS
10.0000 mg | ORAL_TABLET | Freq: Every day | ORAL | Status: DC
  Administered 2014-07-19: 10 mg via ORAL
  Filled 2014-07-19: qty 2
  Filled 2014-07-19 (×2): qty 1

## 2014-07-19 MED ORDER — PROPOFOL 10 MG/ML IV BOLUS
INTRAVENOUS | Status: DC | PRN
  Administered 2014-07-19: 130 mg via INTRAVENOUS

## 2014-07-19 MED ORDER — BACITRACIN-POLYMYXIN B 500-10000 UNIT/GM OP OINT
TOPICAL_OINTMENT | OPHTHALMIC | Status: AC
  Filled 2014-07-19: qty 3.5

## 2014-07-19 MED ORDER — TEMAZEPAM 15 MG PO CAPS: 15.0000 mg | ORAL_CAPSULE | Freq: Every evening | ORAL | Status: DC | PRN

## 2014-07-19 MED ORDER — OXYCODONE HCL 5 MG/5ML PO SOLN: 5.0000 mg | Freq: Once | ORAL | Status: DC | PRN

## 2014-07-19 MED ORDER — PROPOFOL 10 MG/ML IV BOLUS
INTRAVENOUS | Status: AC
  Filled 2014-07-19: qty 20

## 2014-07-19 MED ORDER — NEOSTIGMINE METHYLSULFATE 10 MG/10ML IV SOLN
INTRAVENOUS | Status: AC
  Filled 2014-07-19: qty 1

## 2014-07-19 MED ORDER — TETRACAINE HCL 0.5 % OP SOLN
2.0000 [drp] | Freq: Once | OPHTHALMIC | Status: DC
  Filled 2014-07-19: qty 2

## 2014-07-19 MED ORDER — ATROPINE SULFATE 1 % OP SOLN
OPHTHALMIC | Status: AC
  Filled 2014-07-19: qty 2

## 2014-07-19 MED ORDER — MECLIZINE HCL 25 MG PO TABS
25.0000 mg | ORAL_TABLET | Freq: Three times a day (TID) | ORAL | Status: DC | PRN
  Filled 2014-07-19: qty 1

## 2014-07-19 MED ORDER — NEOSTIGMINE METHYLSULFATE 10 MG/10ML IV SOLN
INTRAVENOUS | Status: DC | PRN
  Administered 2014-07-19: 4 mg via INTRAVENOUS

## 2014-07-19 MED ORDER — MAGNESIUM HYDROXIDE 400 MG/5ML PO SUSP: 15.0000 mL | Freq: Four times a day (QID) | ORAL | Status: DC | PRN

## 2014-07-19 MED ORDER — MORPHINE SULFATE 2 MG/ML IJ SOLN
1.0000 mg | INTRAMUSCULAR | Status: DC | PRN
  Administered 2014-07-19: 2 mg via INTRAVENOUS
  Filled 2014-07-19: qty 1

## 2014-07-19 MED ORDER — HYPROMELLOSE (GONIOSCOPIC) 2.5 % OP SOLN
OPHTHALMIC | Status: AC
  Filled 2014-07-19: qty 15

## 2014-07-19 MED ORDER — ROCURONIUM BROMIDE 50 MG/5ML IV SOLN
INTRAVENOUS | Status: AC
  Filled 2014-07-19: qty 1

## 2014-07-19 MED ORDER — LOSARTAN POTASSIUM 50 MG PO TABS
100.0000 mg | ORAL_TABLET | Freq: Every day | ORAL | Status: DC
  Administered 2014-07-19: 100 mg via ORAL
  Filled 2014-07-19 (×2): qty 2

## 2014-07-19 MED ORDER — BSS PLUS IO SOLN
INTRAOCULAR | Status: AC
  Filled 2014-07-19: qty 500

## 2014-07-19 MED ORDER — ONDANSETRON HCL 4 MG/2ML IJ SOLN
INTRAMUSCULAR | Status: AC
  Filled 2014-07-19: qty 2

## 2014-07-19 MED ORDER — GATIFLOXACIN 0.5 % OP SOLN
1.0000 [drp] | Freq: Four times a day (QID) | OPHTHALMIC | Status: DC
  Administered 2014-07-19: 10:00:00 via OPHTHALMIC
  Filled 2014-07-19: qty 2.5

## 2014-07-19 MED ORDER — HYDROCODONE-ACETAMINOPHEN 5-325 MG PO TABS
1.0000 | ORAL_TABLET | ORAL | Status: DC | PRN
  Administered 2014-07-19: 1 via ORAL
  Administered 2014-07-19: 2 via ORAL
  Filled 2014-07-19: qty 2
  Filled 2014-07-19: qty 1

## 2014-07-19 MED ORDER — SODIUM HYALURONATE 10 MG/ML IO SOLN
INTRAOCULAR | Status: AC
Start: 2014-07-19 — End: 2014-07-19
  Filled 2014-07-19: qty 0.85

## 2014-07-19 MED ORDER — TRIAMCINOLONE ACETONIDE 40 MG/ML IJ SUSP
INTRAMUSCULAR | Status: AC
  Filled 2014-07-19: qty 5

## 2014-07-19 MED ORDER — FENTANYL CITRATE 0.05 MG/ML IJ SOLN
INTRAMUSCULAR | Status: DC | PRN
  Administered 2014-07-19 (×3): 50 ug via INTRAVENOUS

## 2014-07-19 MED ORDER — BSS IO SOLN
INTRAOCULAR | Status: AC
  Filled 2014-07-19: qty 15

## 2014-07-19 MED ORDER — LIDOCAINE HCL (CARDIAC) 20 MG/ML IV SOLN
INTRAVENOUS | Status: AC
  Filled 2014-07-19: qty 5

## 2014-07-19 MED ORDER — TROPICAMIDE 1 % OP SOLN
OPHTHALMIC | Status: AC
  Administered 2014-07-19: 10:00:00
  Filled 2014-07-19: qty 3

## 2014-07-19 MED ORDER — GENTAMICIN SULFATE 40 MG/ML IJ SOLN
INTRAMUSCULAR | Status: AC
  Filled 2014-07-19: qty 2

## 2014-07-19 MED ORDER — BACITRACIN-POLYMYXIN B 500-10000 UNIT/GM OP OINT
TOPICAL_OINTMENT | OPHTHALMIC | Status: DC | PRN
  Administered 2014-07-19: 1 via OPHTHALMIC

## 2014-07-19 MED ORDER — LIDOCAINE HCL 2 % IJ SOLN
INTRAMUSCULAR | Status: AC
  Filled 2014-07-19: qty 20

## 2014-07-19 MED ORDER — BRIMONIDINE TARTRATE 0.2 % OP SOLN
1.0000 [drp] | Freq: Two times a day (BID) | OPHTHALMIC | Status: DC
  Administered 2014-07-20: 1 [drp] via OPHTHALMIC
  Filled 2014-07-19: qty 5

## 2014-07-19 MED ORDER — DOCUSATE SODIUM 100 MG PO CAPS
100.0000 mg | ORAL_CAPSULE | Freq: Two times a day (BID) | ORAL | Status: DC
  Administered 2014-07-19: 100 mg via ORAL
  Filled 2014-07-19: qty 1

## 2014-07-19 MED ORDER — CLONIDINE HCL 0.3 MG/24HR TD PTWK
0.3000 mg | MEDICATED_PATCH | TRANSDERMAL | Status: DC
  Administered 2014-07-19: 0.3 mg via TRANSDERMAL
  Filled 2014-07-19: qty 1

## 2014-07-19 MED ORDER — GATIFLOXACIN 0.5 % OP SOLN
OPHTHALMIC | Status: AC
  Filled 2014-07-19: qty 2.5

## 2014-07-19 MED ORDER — OXYCODONE HCL 5 MG PO TABS: 5.0000 mg | ORAL_TABLET | Freq: Once | ORAL | Status: DC | PRN

## 2014-07-19 MED ORDER — DEXAMETHASONE SODIUM PHOSPHATE 10 MG/ML IJ SOLN
INTRAMUSCULAR | Status: AC
  Filled 2014-07-19: qty 1

## 2014-07-19 MED ORDER — EPINEPHRINE HCL 1 MG/ML IJ SOLN
INTRAMUSCULAR | Status: AC
  Filled 2014-07-19: qty 1

## 2014-07-19 MED ORDER — LACTATED RINGERS IV SOLN: INTRAVENOUS | Status: DC

## 2014-07-19 MED ORDER — PHENYLEPHRINE HCL 2.5 % OP SOLN
1.0000 [drp] | OPHTHALMIC | Status: DC
  Administered 2014-07-19 (×2): 1 [drp] via OPHTHALMIC

## 2014-07-19 MED ORDER — ROCURONIUM BROMIDE 100 MG/10ML IV SOLN
INTRAVENOUS | Status: DC | PRN
  Administered 2014-07-19: 50 mg via INTRAVENOUS

## 2014-07-19 MED ORDER — ARTIFICIAL TEARS OP OINT
TOPICAL_OINTMENT | OPHTHALMIC | Status: DC | PRN
  Administered 2014-07-19: 1 via OPHTHALMIC

## 2014-07-19 MED ORDER — ACETAZOLAMIDE SODIUM 500 MG IJ SOLR
INTRAMUSCULAR | Status: AC
  Filled 2014-07-19: qty 500

## 2014-07-19 MED ORDER — SODIUM CHLORIDE 0.9 % IJ SOLN
INTRAMUSCULAR | Status: AC
  Filled 2014-07-19: qty 10

## 2014-07-19 MED ORDER — GLYCOPYRROLATE 0.2 MG/ML IJ SOLN
INTRAMUSCULAR | Status: DC | PRN
  Administered 2014-07-19: 0.1 mg via INTRAVENOUS
  Administered 2014-07-19: 0.6 mg via INTRAVENOUS

## 2014-07-19 MED ORDER — PREDNISOLONE ACETATE 1 % OP SUSP
1.0000 [drp] | Freq: Four times a day (QID) | OPHTHALMIC | Status: DC
  Administered 2014-07-20 (×2): 1 [drp] via OPHTHALMIC
  Filled 2014-07-19: qty 1

## 2014-07-19 MED ORDER — MIDAZOLAM HCL 2 MG/2ML IJ SOLN
INTRAMUSCULAR | Status: AC
  Filled 2014-07-19: qty 2

## 2014-07-19 MED ORDER — GATIFLOXACIN 0.5 % OP SOLN
1.0000 [drp] | OPHTHALMIC | Status: DC
  Administered 2014-07-19 (×2): 1 [drp] via OPHTHALMIC

## 2014-07-19 MED ORDER — FERROUS SULFATE 325 (65 FE) MG PO TABS
325.0000 mg | ORAL_TABLET | Freq: Every day | ORAL | Status: DC
Start: 2014-07-20 — End: 2014-07-20
  Administered 2014-07-20: 325 mg via ORAL
  Filled 2014-07-19 (×2): qty 1

## 2014-07-19 MED ORDER — SODIUM CHLORIDE 0.9 % IJ SOLN
INTRAMUSCULAR | Status: DC | PRN
  Administered 2014-07-19: 13:00:00

## 2014-07-19 MED ORDER — FENTANYL CITRATE 0.05 MG/ML IJ SOLN
25.0000 ug | INTRAMUSCULAR | Status: DC | PRN
  Administered 2014-07-19 (×2): 25 ug via INTRAVENOUS

## 2014-07-19 MED ORDER — GLYCOPYRROLATE 0.2 MG/ML IJ SOLN
INTRAMUSCULAR | Status: AC
  Filled 2014-07-19: qty 3

## 2014-07-19 MED ORDER — SODIUM HYALURONATE 10 MG/ML IO SOLN
INTRAOCULAR | Status: DC | PRN
  Administered 2014-07-19: 0.85 mL via INTRAOCULAR

## 2014-07-19 MED ORDER — PREDNISOLONE ACETATE 1 % OP SUSP: 1.0000 [drp] | Freq: Four times a day (QID) | OPHTHALMIC | Status: DC

## 2014-07-19 SURGICAL SUPPLY — 52 items
APPLICATOR DR MATTHEWS STRL (MISCELLANEOUS) ×12 IMPLANT
COTTONBALL LRG STERILE PKG (GAUZE/BANDAGES/DRESSINGS) ×6 IMPLANT
COVER MAYO STAND STRL (DRAPES) ×2 IMPLANT
COVER SURGICAL LIGHT HANDLE (MISCELLANEOUS) ×2 IMPLANT
DRAPE INCISE 51X51 W/FILM STRL (DRAPES) ×2 IMPLANT
DRAPE OPHTHALMIC 77X100 STRL (CUSTOM PROCEDURE TRAY) ×2 IMPLANT
ERASER HMR WETFIELD 23G BP (MISCELLANEOUS) IMPLANT
FILTER BLUE MILLIPORE (MISCELLANEOUS) ×4 IMPLANT
FILTER STRAW FLUID ASPIR (MISCELLANEOUS) IMPLANT
GAS OPHTHALMIC (MISCELLANEOUS) ×2 IMPLANT
GLOVE SS BIOGEL STRL SZ 6.5 (GLOVE) ×1 IMPLANT
GLOVE SS BIOGEL STRL SZ 7 (GLOVE) ×1 IMPLANT
GLOVE SUPERSENSE BIOGEL SZ 6.5 (GLOVE) ×1
GLOVE SUPERSENSE BIOGEL SZ 7 (GLOVE) ×1
GLOVE SURG 8.5 LATEX PF (GLOVE) ×2 IMPLANT
GOWN STRL REUS W/ TWL LRG LVL3 (GOWN DISPOSABLE) ×2 IMPLANT
GOWN STRL REUS W/TWL LRG LVL3 (GOWN DISPOSABLE) ×2
IMPL SILICONE (Ophthalmic Related) ×1 IMPLANT
IMPLANT SILICONE (Ophthalmic Related) ×3 IMPLANT
KIT BASIN OR (CUSTOM PROCEDURE TRAY) ×2 IMPLANT
KIT ROOM TURNOVER OR (KITS) ×2 IMPLANT
KNIFE CRESCENT 1.75 EDGEAHEAD (BLADE) IMPLANT
KNIFE GRIESHABER SHARP 2.5MM (MISCELLANEOUS) ×6 IMPLANT
NEEDLE 18GX1X1/2 (RX/OR ONLY) (NEEDLE) ×2 IMPLANT
NEEDLE 27GAX1X1/2 (NEEDLE) IMPLANT
NEEDLE HYPO 30X.5 LL (NEEDLE) ×4 IMPLANT
NS IRRIG 1000ML POUR BTL (IV SOLUTION) ×2 IMPLANT
PACK VITRECTOMY CUSTOM (CUSTOM PROCEDURE TRAY) ×2 IMPLANT
PAD ARMBOARD 7.5X6 YLW CONV (MISCELLANEOUS) ×4 IMPLANT
PAK PIK VITRECTOMY CVS 25GA (OPHTHALMIC) IMPLANT
REPL STRA BRUSH NEEDLE (NEEDLE) IMPLANT
RESERVOIR BACK FLUSH (MISCELLANEOUS) IMPLANT
ROLLS DENTAL (MISCELLANEOUS) ×4 IMPLANT
SLEEVE SCLERAL TYPE 270 (Ophthalmic Related) ×2 IMPLANT
SPEAR EYE SURG WECK-CEL (MISCELLANEOUS) ×8 IMPLANT
SPONGE SURGIFOAM ABS GEL 12-7 (HEMOSTASIS) IMPLANT
STOPCOCK 4 WAY LG BORE MALE ST (IV SETS) IMPLANT
SUT CHROMIC 7 0 TG140 8 (SUTURE) ×2 IMPLANT
SUT ETHILON 9 0 TG140 8 (SUTURE) IMPLANT
SUT MERSILENE 4 0 RV 2 (SUTURE) ×4 IMPLANT
SUT SILK 2 0 (SUTURE) ×1
SUT SILK 2-0 18XBRD TIE 12 (SUTURE) ×1 IMPLANT
SUT SILK 4 0 RB 1 (SUTURE) ×2 IMPLANT
SYR 20CC LL (SYRINGE) ×2 IMPLANT
SYR 5ML LL (SYRINGE) ×2 IMPLANT
SYR BULB 3OZ (MISCELLANEOUS) ×2 IMPLANT
SYR TB 1ML LUER SLIP (SYRINGE) IMPLANT
SYRINGE 10CC LL (SYRINGE) ×2 IMPLANT
TIRE RTNL 2.5XGRV CNCV 9X (Ophthalmic Related) ×1 IMPLANT
TOWEL OR 17X24 6PK STRL BLUE (TOWEL DISPOSABLE) ×6 IMPLANT
WATER STERILE IRR 1000ML POUR (IV SOLUTION) ×2 IMPLANT
WIPE INSTRUMENT VISIWIPE 73X73 (MISCELLANEOUS) ×2 IMPLANT

## 2014-07-19 NOTE — Anesthesia Postprocedure Evaluation (Signed)
  Anesthesia Post-op Note  Patient: Megan Stevenson  Procedure(s) Performed: Procedure(s) with comments: SCLERAL BUCKLE (Right) INSERTION OF GAS (Right) - C3F8 LASER PHOTO ABLATION (Right)  Patient Location: PACU  Anesthesia Type:General  Level of Consciousness: awake and sedated  Airway and Oxygen Therapy: Patient Spontanous Breathing  Post-op Pain: mild  Post-op Assessment: Post-op Vital signs reviewed and Patient's Cardiovascular Status Stable  Post-op Vital Signs: stable  Last Vitals:  Filed Vitals:   07/19/14 1615  BP:   Pulse: 49  Temp: 36.9 C  Resp: 10    Complications: No apparent anesthesia complications

## 2014-07-19 NOTE — Progress Notes (Signed)
No beta blocker was given today due to heart rate of 54.

## 2014-07-19 NOTE — Progress Notes (Signed)
Brief Operative note   Preoperative diagnosis:  Retinal detachment right eye Postoperative diagnosis  Post-Op Diagnosis Codes:    * Retinal detachment with retinal defect, unspecified [361.00]  Procedures: Scleral buckle right eye, laser and gas injection  Surgeon:  Hayden Pedro, MD...  Assistant:  Deatra Ina SA    Anesthesia: General  Specimen: none  Estimated blood loss:  1cc  Complications: none  Patient sent to PACU in good condition  Composed by Hayden Pedro MD  Dictation number: [id number]

## 2014-07-19 NOTE — H&P (Signed)
I examined the patient today and there is no change in the medical status 

## 2014-07-19 NOTE — Transfer of Care (Signed)
Immediate Anesthesia Transfer of Care Note  Patient: Megan Stevenson  Procedure(s) Performed: Procedure(s) with comments: SCLERAL BUCKLE (Right) INSERTION OF GAS (Right) - C3F8 LASER PHOTO ABLATION (Right)  Patient Location: PACU  Anesthesia Type:General  Level of Consciousness: awake, oriented and patient cooperative  Airway & Oxygen Therapy: Patient Spontanous Breathing and Patient connected to nasal cannula oxygen  Post-op Assessment: Report given to PACU RN, Post -op Vital signs reviewed and stable and Patient moving all extremities  Post vital signs: Reviewed and stable  Complications: No apparent anesthesia complications

## 2014-07-19 NOTE — Anesthesia Preprocedure Evaluation (Addendum)
Anesthesia Evaluation  Patient identified by MRN, date of birth, ID band Patient awake    Reviewed: Allergy & Precautions, H&P , NPO status , Patient's Chart, lab work & pertinent test results  Airway Mallampati: II TM Distance: >3 FB Neck ROM: full    Dental  (+) Teeth Intact, Missing, Dental Advisory Given, Chipped   Pulmonary former smoker,          Cardiovascular hypertension, Pt. on medications     Neuro/Psych    GI/Hepatic PUD,   Endo/Other  diabetes, Well Controlled, Type 2  Renal/GU Renal InsufficiencyRenal disease     Musculoskeletal  (+) Arthritis -,   Abdominal   Peds  Hematology   Anesthesia Other Findings Angled chips upper middle 2 teeth; missing some in back up and down  Reproductive/Obstetrics                          Anesthesia Physical Anesthesia Plan  ASA: III  Anesthesia Plan: General   Post-op Pain Management:    Induction: Intravenous  Airway Management Planned: Oral ETT  Additional Equipment:   Intra-op Plan:   Post-operative Plan: Extubation in OR  Informed Consent: I have reviewed the patients History and Physical, chart, labs and discussed the procedure including the risks, benefits and alternatives for the proposed anesthesia with the patient or authorized representative who has indicated his/her understanding and acceptance.     Plan Discussed with: CRNA, Anesthesiologist and Surgeon  Anesthesia Plan Comments:         Anesthesia Quick Evaluation

## 2014-07-20 DIAGNOSIS — H33009 Unspecified retinal detachment with retinal break, unspecified eye: Secondary | ICD-10-CM | POA: Diagnosis not present

## 2014-07-20 MED ORDER — BRIMONIDINE TARTRATE 0.2 % OP SOLN: 1.0000 [drp] | Freq: Two times a day (BID) | OPHTHALMIC | Status: DC

## 2014-07-20 MED ORDER — LATANOPROST 0.005 % OP SOLN: 1.0000 [drp] | Freq: Every day | OPHTHALMIC | Status: DC

## 2014-07-20 MED ORDER — PREDNISOLONE ACETATE 1 % OP SUSP: 1.0000 [drp] | Freq: Four times a day (QID) | OPHTHALMIC | Status: DC

## 2014-07-20 MED ORDER — BACITRACIN-POLYMYXIN B 500-10000 UNIT/GM OP OINT: 1.0000 "application " | TOPICAL_OINTMENT | Freq: Four times a day (QID) | OPHTHALMIC | Status: DC

## 2014-07-20 MED ORDER — GATIFLOXACIN 0.5 % OP SOLN: 1.0000 [drp] | Freq: Four times a day (QID) | OPHTHALMIC | Status: DC

## 2014-07-20 NOTE — Discharge Summary (Signed)
Discharge summary not needed on [facility] patients per medical records. 

## 2014-07-20 NOTE — Progress Notes (Signed)
Discharge instructions gone over with patient by by Alcide Evener, RN. Home medications gone over. Follow up appointment is made. Diet , activity, eye care, good hand washing, and signs and symptoms of infection gone over. Patient has eye drops and instructions for administration. My chart discussed. Reasons to call the doctor gone over. Patient verbalized understanding of instructions.

## 2014-07-20 NOTE — Progress Notes (Signed)
07/20/2014, 6:37 AM  Mental Status:  Awake, Alert, Oriented  Anterior segment: Cornea  Clear    Anterior Chamber Clear    Lens:    IOL  Intra Ocular Pressure 25 mmHg with Tonopen  Vitreous: Clear 60%gas bubble   Retina:  Attached Good laser reaction   Impression: Excellent result Retina attached   Final Diagnosis: Principal Problem:   Rhegmatogenous retinal detachment of right eye   Plan: start post operative eye drops.  Discharge to home.  Give post operative instructions  Hayden Pedro 07/20/2014, 6:37 AM

## 2014-07-20 NOTE — Op Note (Signed)
Megan Stevenson, Megan Stevenson                ACCOUNT NO.:  [id number]  MEDICAL RECORD NO.:  [id number]  LOCATION:  6N19C                        FACILITY:  [facility]  PHYSICIAN:  Chrystie Nose. Zigmund Daniel, M.D. DATE OF BIRTH:  Jun 26, 1941  DATE OF PROCEDURE:  07/19/2014 DATE OF DISCHARGE:                              OPERATIVE REPORT   ADMISSION DIAGNOSIS:  Rhegmatogenous retinal detachment in the right eye.  PROCEDURE:  Scleral buckle, right eye; gas fluid exchange, right eye; retinal photocoagulation, right eye.  SURGEON:  Chrystie Nose. Zigmund Daniel, M.D.  ASSISTANT:  Deatra Ina, SA.  ANESTHESIA:  General.  PROCEDURE IN DETAIL:  After usual prep and drape, 360-degree limbal peritomy, isolation of 4 rectus muscles on 2-0 silk, scleral dissection for 360 degrees to admit a #279 intrascleral implant.  Diathermy placed in the bed.  Two sutures per quadrant for a total of 8 scleral sutures were placed.  A #279 implant had 2 mm trim from the posterior edge. This implant was then placed against the globe with a joint at 2 o'clock.  A 240 band was placed around the globe with a 270 sleeve at 2 o'clock.  Perforation site chosen at 5 o'clock in the posterior aspect of the bed.  A moderate amount of clear colorless subretinal fluid came forth in a controlled manner.  The fluid was sticky.  Once the fluid stopped, the scleral sutures were drawn securely and the buckle was adjusted.  The band was adjusted and trimmed.  The buckle was adjusted and trimmed.  Indirect ophthalmoscopy showed the retina to be lying nicely in place on the scleral buckle.  A 0.5 mL of 50% C3F8 was injected into the vitreous cavity to reinflate the globe.  The indirect ophthalmoscope laser was moved into place.  888 burns were placed around the retinal periphery.  The power was 700 mW and 1000 microns each and 0.1 seconds each.  The buckle was adjusted again.  The sutures were knotted and the free ends removed.  The conjunctiva was reposited  with 7- 0 chromic suture.  Polymyxin and gentamicin were irrigated into tenon space.  Atropine solution was applied.  Closing pressure was 10 with a Barraquer tonometer.  Polysporin ophthalmic ointment, patch, and shield were placed.  The patient was awakened, and taken to recovery in satisfactory condition.  OPERATIVE TIME:  2 hours.     Chrystie Nose. Zigmund Daniel, M.D.     JDM/MEDQ  D:  07/19/2014  T:  07/20/2014  Job:  [id number]

## 2014-07-21 ENCOUNTER — Encounter (HOSPITAL_COMMUNITY): Payer: Self-pay | Admitting: Ophthalmology

## 2014-07-25 ENCOUNTER — Encounter (INDEPENDENT_AMBULATORY_CARE_PROVIDER_SITE_OTHER): Payer: Medicare Other | Admitting: Ophthalmology

## 2014-07-25 DIAGNOSIS — H33009 Unspecified retinal detachment with retinal break, unspecified eye: Secondary | ICD-10-CM

## 2014-08-19 ENCOUNTER — Encounter (INDEPENDENT_AMBULATORY_CARE_PROVIDER_SITE_OTHER): Payer: Medicare Other | Admitting: Ophthalmology

## 2014-08-19 DIAGNOSIS — H33009 Unspecified retinal detachment with retinal break, unspecified eye: Secondary | ICD-10-CM

## 2014-10-28 ENCOUNTER — Encounter (INDEPENDENT_AMBULATORY_CARE_PROVIDER_SITE_OTHER): Payer: Medicare Other | Admitting: Ophthalmology

## 2014-10-28 DIAGNOSIS — H338 Other retinal detachments: Secondary | ICD-10-CM | POA: Diagnosis not present

## 2014-10-28 DIAGNOSIS — E11321 Type 2 diabetes mellitus with mild nonproliferative diabetic retinopathy with macular edema: Secondary | ICD-10-CM

## 2014-10-28 DIAGNOSIS — E11311 Type 2 diabetes mellitus with unspecified diabetic retinopathy with macular edema: Secondary | ICD-10-CM

## 2014-10-28 DIAGNOSIS — I10 Essential (primary) hypertension: Secondary | ICD-10-CM

## 2014-10-28 DIAGNOSIS — H35341 Macular cyst, hole, or pseudohole, right eye: Secondary | ICD-10-CM | POA: Diagnosis not present

## 2014-10-28 DIAGNOSIS — E11329 Type 2 diabetes mellitus with mild nonproliferative diabetic retinopathy without macular edema: Secondary | ICD-10-CM | POA: Diagnosis not present

## 2014-10-28 DIAGNOSIS — H35033 Hypertensive retinopathy, bilateral: Secondary | ICD-10-CM

## 2014-11-21 ENCOUNTER — Other Ambulatory Visit (HOSPITAL_COMMUNITY): Payer: Self-pay | Admitting: Internal Medicine

## 2014-11-21 DIAGNOSIS — M109 Gout, unspecified: Secondary | ICD-10-CM | POA: Diagnosis not present

## 2014-11-21 DIAGNOSIS — Z1231 Encounter for screening mammogram for malignant neoplasm of breast: Secondary | ICD-10-CM

## 2014-11-21 DIAGNOSIS — D509 Iron deficiency anemia, unspecified: Secondary | ICD-10-CM | POA: Diagnosis not present

## 2014-11-21 DIAGNOSIS — E119 Type 2 diabetes mellitus without complications: Secondary | ICD-10-CM | POA: Diagnosis not present

## 2014-11-24 DIAGNOSIS — M1990 Unspecified osteoarthritis, unspecified site: Secondary | ICD-10-CM | POA: Diagnosis not present

## 2014-11-24 DIAGNOSIS — E785 Hyperlipidemia, unspecified: Secondary | ICD-10-CM | POA: Diagnosis not present

## 2014-11-24 DIAGNOSIS — I10 Essential (primary) hypertension: Secondary | ICD-10-CM | POA: Diagnosis not present

## 2014-11-24 DIAGNOSIS — E119 Type 2 diabetes mellitus without complications: Secondary | ICD-10-CM | POA: Diagnosis not present

## 2014-11-24 DIAGNOSIS — N183 Chronic kidney disease, stage 3 (moderate): Secondary | ICD-10-CM | POA: Diagnosis not present

## 2014-11-24 DIAGNOSIS — Z0000 Encounter for general adult medical examination without abnormal findings: Secondary | ICD-10-CM | POA: Diagnosis not present

## 2014-11-24 DIAGNOSIS — M109 Gout, unspecified: Secondary | ICD-10-CM | POA: Diagnosis not present

## 2014-12-05 ENCOUNTER — Other Ambulatory Visit (HOSPITAL_COMMUNITY): Payer: Self-pay | Admitting: *Deleted

## 2014-12-06 ENCOUNTER — Encounter (HOSPITAL_COMMUNITY): Payer: Medicare Other

## 2014-12-26 DIAGNOSIS — Z961 Presence of intraocular lens: Secondary | ICD-10-CM | POA: Diagnosis not present

## 2014-12-29 ENCOUNTER — Ambulatory Visit (HOSPITAL_COMMUNITY)
Admission: RE | Admit: 2014-12-29 | Discharge: 2014-12-29 | Disposition: A | Discharge status: Home / Self Care | Payer: Medicare Other | Source: Ambulatory Visit | Attending: Internal Medicine | Admitting: Internal Medicine

## 2014-12-29 DIAGNOSIS — Z1231 Encounter for screening mammogram for malignant neoplasm of breast: Secondary | ICD-10-CM | POA: Diagnosis not present

## 2015-01-13 DIAGNOSIS — M67432 Ganglion, left wrist: Secondary | ICD-10-CM | POA: Diagnosis not present

## 2015-01-17 ENCOUNTER — Other Ambulatory Visit: Payer: Self-pay | Admitting: Orthopedic Surgery

## 2015-01-30 ENCOUNTER — Encounter (HOSPITAL_BASED_OUTPATIENT_CLINIC_OR_DEPARTMENT_OTHER): Payer: Self-pay | Admitting: *Deleted

## 2015-01-30 ENCOUNTER — Encounter (HOSPITAL_BASED_OUTPATIENT_CLINIC_OR_DEPARTMENT_OTHER)
Admission: RE | Admit: 2015-01-30 | Discharge: 2015-01-30 | Disposition: A | Discharge status: Home / Self Care | Payer: Medicare Other | Source: Ambulatory Visit | Attending: Orthopedic Surgery | Admitting: Orthopedic Surgery

## 2015-01-30 DIAGNOSIS — Z79899 Other long term (current) drug therapy: Secondary | ICD-10-CM | POA: Diagnosis not present

## 2015-01-30 DIAGNOSIS — M1990 Unspecified osteoarthritis, unspecified site: Secondary | ICD-10-CM | POA: Diagnosis not present

## 2015-01-30 DIAGNOSIS — Z7982 Long term (current) use of aspirin: Secondary | ICD-10-CM | POA: Diagnosis not present

## 2015-01-30 DIAGNOSIS — E119 Type 2 diabetes mellitus without complications: Secondary | ICD-10-CM | POA: Diagnosis not present

## 2015-01-30 DIAGNOSIS — M67432 Ganglion, left wrist: Secondary | ICD-10-CM | POA: Diagnosis not present

## 2015-01-30 DIAGNOSIS — D649 Anemia, unspecified: Secondary | ICD-10-CM | POA: Diagnosis not present

## 2015-01-30 DIAGNOSIS — I10 Essential (primary) hypertension: Secondary | ICD-10-CM | POA: Diagnosis not present

## 2015-01-30 DIAGNOSIS — Z87891 Personal history of nicotine dependence: Secondary | ICD-10-CM | POA: Diagnosis not present

## 2015-01-30 LAB — BASIC METABOLIC PANEL
ANION GAP: 6 (ref 5–15)
BUN: 24 mg/dL — AB (ref 6–23)
CO2: 24 mmol/L (ref 19–32)
CREATININE: 1.58 mg/dL — AB (ref 0.50–1.10)
Calcium: 10 mg/dL (ref 8.4–10.5)
Chloride: 111 mmol/L (ref 96–112)
GFR calc Af Amer: 36 mL/min — ABNORMAL LOW (ref >90)
GFR, EST NON AFRICAN AMERICAN: 31 mL/min — AB (ref >90)
Glucose, Bld: 101 mg/dL — ABNORMAL HIGH (ref 70–99)
Potassium: 3.9 mmol/L (ref 3.5–5.1)
SODIUM: 141 mmol/L (ref 135–145)

## 2015-01-30 NOTE — Progress Notes (Signed)
To come in for ekg-bmet 

## 2015-02-01 MED ORDER — SODIUM CHLORIDE 0.9 % IV SOLN: 1020.0000 mg | Freq: Once | INTRAVENOUS | Status: DC

## 2015-02-02 ENCOUNTER — Encounter (HOSPITAL_BASED_OUTPATIENT_CLINIC_OR_DEPARTMENT_OTHER)
Admission: RE | Disposition: A | Discharge status: Home / Self Care | Payer: Self-pay | Source: Ambulatory Visit | Attending: Orthopedic Surgery

## 2015-02-02 ENCOUNTER — Encounter (HOSPITAL_BASED_OUTPATIENT_CLINIC_OR_DEPARTMENT_OTHER): Payer: Self-pay | Admitting: *Deleted

## 2015-02-02 ENCOUNTER — Ambulatory Visit (HOSPITAL_BASED_OUTPATIENT_CLINIC_OR_DEPARTMENT_OTHER)
Admission: RE | Admit: 2015-02-02 | Discharge: 2015-02-02 | Disposition: A | Discharge status: Home / Self Care | Payer: Medicare Other | Source: Ambulatory Visit | Attending: Orthopedic Surgery | Admitting: Orthopedic Surgery

## 2015-02-02 ENCOUNTER — Ambulatory Visit (HOSPITAL_BASED_OUTPATIENT_CLINIC_OR_DEPARTMENT_OTHER): Payer: Medicare Other | Admitting: Anesthesiology

## 2015-02-02 DIAGNOSIS — M67432 Ganglion, left wrist: Secondary | ICD-10-CM | POA: Insufficient documentation

## 2015-02-02 DIAGNOSIS — D649 Anemia, unspecified: Secondary | ICD-10-CM | POA: Diagnosis not present

## 2015-02-02 DIAGNOSIS — I10 Essential (primary) hypertension: Secondary | ICD-10-CM | POA: Insufficient documentation

## 2015-02-02 DIAGNOSIS — Z87891 Personal history of nicotine dependence: Secondary | ICD-10-CM | POA: Insufficient documentation

## 2015-02-02 DIAGNOSIS — Z79899 Other long term (current) drug therapy: Secondary | ICD-10-CM | POA: Insufficient documentation

## 2015-02-02 DIAGNOSIS — E119 Type 2 diabetes mellitus without complications: Secondary | ICD-10-CM | POA: Diagnosis not present

## 2015-02-02 DIAGNOSIS — M1990 Unspecified osteoarthritis, unspecified site: Secondary | ICD-10-CM | POA: Diagnosis not present

## 2015-02-02 DIAGNOSIS — Z7982 Long term (current) use of aspirin: Secondary | ICD-10-CM | POA: Insufficient documentation

## 2015-02-02 HISTORY — DX: Presence of spectacles and contact lenses: Z97.3

## 2015-02-02 HISTORY — PX: GANGLION CYST EXCISION: SHX1691

## 2015-02-02 SURGERY — EXCISION, GANGLION CYST, WRIST
Anesthesia: General | Site: Wrist | Laterality: Left

## 2015-02-02 MED ORDER — ONDANSETRON HCL 4 MG/2ML IJ SOLN
INTRAMUSCULAR | Status: DC | PRN
  Administered 2015-02-02: 4 mg via INTRAVENOUS

## 2015-02-02 MED ORDER — FENTANYL CITRATE 0.05 MG/ML IJ SOLN
INTRAMUSCULAR | Status: DC | PRN
  Administered 2015-02-02: 50 ug via INTRAVENOUS

## 2015-02-02 MED ORDER — GLYCOPYRROLATE 0.2 MG/ML IJ SOLN
INTRAMUSCULAR | Status: DC | PRN
Start: 2015-02-02 — End: 2015-02-02
  Administered 2015-02-02: 0.2 mg via INTRAVENOUS

## 2015-02-02 MED ORDER — HYDROCODONE-ACETAMINOPHEN 5-325 MG PO TABS: ORAL_TABLET | ORAL | Status: DC

## 2015-02-02 MED ORDER — CHLORHEXIDINE GLUCONATE 4 % EX LIQD: 60.0000 mL | Freq: Once | CUTANEOUS | Status: DC

## 2015-02-02 MED ORDER — FENTANYL CITRATE 0.05 MG/ML IJ SOLN
INTRAMUSCULAR | Status: AC
  Filled 2015-02-02: qty 4

## 2015-02-02 MED ORDER — LIDOCAINE HCL (CARDIAC) 20 MG/ML IV SOLN
INTRAVENOUS | Status: DC | PRN
  Administered 2015-02-02: 60 mg via INTRAVENOUS

## 2015-02-02 MED ORDER — FENTANYL CITRATE 0.05 MG/ML IJ SOLN
25.0000 ug | INTRAMUSCULAR | Status: DC | PRN
  Administered 2015-02-02: 25 ug via INTRAVENOUS

## 2015-02-02 MED ORDER — FENTANYL CITRATE 0.05 MG/ML IJ SOLN: 50.0000 ug | INTRAMUSCULAR | Status: DC | PRN

## 2015-02-02 MED ORDER — MIDAZOLAM HCL 2 MG/2ML IJ SOLN
INTRAMUSCULAR | Status: AC
  Filled 2015-02-02: qty 2

## 2015-02-02 MED ORDER — DEXAMETHASONE SODIUM PHOSPHATE 4 MG/ML IJ SOLN
INTRAMUSCULAR | Status: DC | PRN
  Administered 2015-02-02: 10 mg via INTRAVENOUS

## 2015-02-02 MED ORDER — CEFAZOLIN SODIUM-DEXTROSE 2-3 GM-% IV SOLR
2.0000 g | INTRAVENOUS | Status: AC
  Administered 2015-02-02: 2 g via INTRAVENOUS

## 2015-02-02 MED ORDER — FENTANYL CITRATE 0.05 MG/ML IJ SOLN
INTRAMUSCULAR | Status: AC
Start: 2015-02-02 — End: 2015-02-02
  Filled 2015-02-02: qty 2

## 2015-02-02 MED ORDER — PROMETHAZINE HCL 25 MG/ML IJ SOLN: 6.2500 mg | INTRAMUSCULAR | Status: DC | PRN

## 2015-02-02 MED ORDER — MIDAZOLAM HCL 2 MG/2ML IJ SOLN: 1.0000 mg | INTRAMUSCULAR | Status: DC | PRN

## 2015-02-02 MED ORDER — LACTATED RINGERS IV SOLN
INTRAVENOUS | Status: DC
  Administered 2015-02-02: 08:00:00 via INTRAVENOUS

## 2015-02-02 MED ORDER — PROPOFOL 10 MG/ML IV BOLUS
INTRAVENOUS | Status: DC | PRN
  Administered 2015-02-02: 150 mg via INTRAVENOUS

## 2015-02-02 MED ORDER — CEFAZOLIN SODIUM-DEXTROSE 2-3 GM-% IV SOLR
INTRAVENOUS | Status: AC
  Filled 2015-02-02: qty 50

## 2015-02-02 MED ORDER — BUPIVACAINE HCL (PF) 0.25 % IJ SOLN
INTRAMUSCULAR | Status: DC | PRN
  Administered 2015-02-02: 6 mL

## 2015-02-02 SURGICAL SUPPLY — 56 items
BANDAGE COBAN STERILE 2 (GAUZE/BANDAGES/DRESSINGS) IMPLANT
BANDAGE ELASTIC 3 VELCRO ST LF (GAUZE/BANDAGES/DRESSINGS) ×2 IMPLANT
BENZOIN TINCTURE PRP APPL 2/3 (GAUZE/BANDAGES/DRESSINGS) IMPLANT
BLADE MINI RND TIP GREEN BEAV (BLADE) IMPLANT
BLADE SURG 15 STRL LF DISP TIS (BLADE) ×2 IMPLANT
BLADE SURG 15 STRL SS (BLADE) ×2
BNDG COHESIVE 1X5 TAN STRL LF (GAUZE/BANDAGES/DRESSINGS) IMPLANT
BNDG CONFORM 2 STRL LF (GAUZE/BANDAGES/DRESSINGS) IMPLANT
BNDG ELASTIC 2 VLCR STRL LF (GAUZE/BANDAGES/DRESSINGS) IMPLANT
BNDG ESMARK 4X9 LF (GAUZE/BANDAGES/DRESSINGS) ×2 IMPLANT
BNDG GAUZE 1X2.1 STRL (MISCELLANEOUS) IMPLANT
BNDG GAUZE ELAST 4 BULKY (GAUZE/BANDAGES/DRESSINGS) ×2 IMPLANT
BNDG PLASTER X FAST 3X3 WHT LF (CAST SUPPLIES) IMPLANT
CHLORAPREP W/TINT 26ML (MISCELLANEOUS) ×2 IMPLANT
CORDS BIPOLAR (ELECTRODE) ×2 IMPLANT
COVER BACK TABLE 60X90IN (DRAPES) ×2 IMPLANT
COVER MAYO STAND STRL (DRAPES) ×2 IMPLANT
CUFF TOURNIQUET SINGLE 18IN (TOURNIQUET CUFF) ×2 IMPLANT
DRAPE EXTREMITY T 121X128X90 (DRAPE) ×2 IMPLANT
DRAPE SURG 17X23 STRL (DRAPES) ×2 IMPLANT
DRSG PAD ABDOMINAL 8X10 ST (GAUZE/BANDAGES/DRESSINGS) ×2 IMPLANT
GAUZE SPONGE 4X4 12PLY STRL (GAUZE/BANDAGES/DRESSINGS) ×2 IMPLANT
GAUZE XEROFORM 1X8 LF (GAUZE/BANDAGES/DRESSINGS) ×2 IMPLANT
GLOVE BIO SURGEON STRL SZ7.5 (GLOVE) ×2 IMPLANT
GLOVE BIOGEL PI IND STRL 6.5 (GLOVE) ×1 IMPLANT
GLOVE BIOGEL PI IND STRL 7.0 (GLOVE) ×1 IMPLANT
GLOVE BIOGEL PI IND STRL 8 (GLOVE) ×1 IMPLANT
GLOVE BIOGEL PI INDICATOR 6.5 (GLOVE) ×1
GLOVE BIOGEL PI INDICATOR 7.0 (GLOVE) ×1
GLOVE BIOGEL PI INDICATOR 8 (GLOVE) ×1
GLOVE ECLIPSE 6.5 STRL STRAW (GLOVE) ×2 IMPLANT
GLOVE EXAM NITRILE LRG STRL (GLOVE) ×2 IMPLANT
GLOVE SURG SS PI 6.5 STRL IVOR (GLOVE) ×2 IMPLANT
GOWN STRL REUS W/ TWL LRG LVL3 (GOWN DISPOSABLE) ×2 IMPLANT
GOWN STRL REUS W/TWL LRG LVL3 (GOWN DISPOSABLE) ×2
GOWN STRL REUS W/TWL XL LVL3 (GOWN DISPOSABLE) ×2 IMPLANT
NEEDLE HYPO 25X1 1.5 SAFETY (NEEDLE) ×2 IMPLANT
NS IRRIG 1000ML POUR BTL (IV SOLUTION) ×2 IMPLANT
PACK BASIN DAY SURGERY FS (CUSTOM PROCEDURE TRAY) ×2 IMPLANT
PAD CAST 3X4 CTTN HI CHSV (CAST SUPPLIES) IMPLANT
PAD CAST 4YDX4 CTTN HI CHSV (CAST SUPPLIES) IMPLANT
PADDING CAST ABS 4INX4YD NS (CAST SUPPLIES)
PADDING CAST ABS COTTON 4X4 ST (CAST SUPPLIES) IMPLANT
PADDING CAST COTTON 3X4 STRL (CAST SUPPLIES)
PADDING CAST COTTON 4X4 STRL (CAST SUPPLIES)
STOCKINETTE 4X48 STRL (DRAPES) ×2 IMPLANT
STRIP CLOSURE SKIN 1/2X4 (GAUZE/BANDAGES/DRESSINGS) IMPLANT
SUT ETHILON 3 0 PS 1 (SUTURE) IMPLANT
SUT ETHILON 4 0 PS 2 18 (SUTURE) ×2 IMPLANT
SUT ETHILON 5 0 P 3 18 (SUTURE)
SUT NYLON ETHILON 5-0 P-3 1X18 (SUTURE) IMPLANT
SUT VIC AB 4-0 P2 18 (SUTURE) ×2 IMPLANT
SYR BULB 3OZ (MISCELLANEOUS) ×2 IMPLANT
SYR CONTROL 10ML LL (SYRINGE) ×2 IMPLANT
TOWEL OR 17X24 6PK STRL BLUE (TOWEL DISPOSABLE) ×2 IMPLANT
UNDERPAD 30X30 INCONTINENT (UNDERPADS AND DIAPERS) IMPLANT

## 2015-02-02 NOTE — H&P (Signed)
Megan Stevenson is an 74 y.o. female.   Chief Complaint: ganglion cyst HPI: 74 yo female with left wrist volar mass x 34-5 years.  She has previously had a ganglion excised with recurrence.  It is bothersome to her and she wishes to have it removed again.  Past Medical History  Diagnosis Date  . Hypertension   . Arthritis   . Macular hole     OD  . Dysrhythmia   . Diabetes mellitus without complication     no medications  . Renal disorder     Sees someone [facility]  . Anemia   . Wears glasses     Past Surgical History  Procedure Laterality Date  . Abdominal hysterectomy    . Pars plana vitrectomy w/ repair of macular hole Right 06/14/2014    DR Megan Stevenson  . 25 gauge pars plana vitrectomy with 20 gauge mvr port for macular hole Right 06/14/2014    Procedure: 25 GAUGE PARS PLANA VITRECTOMY WITH 20 GAUGE MVR PORT FOR MACULAR HOLE; serum patch; gas injection; laser treament; C3F8 gas fluid exchange.;  Surgeon: Megan Pedro, MD;  Location: [facility];  Service: Ophthalmology;  Laterality: Right;  . Eye surgery    . Scleral buckle Right 07/19/2014    Procedure: SCLERAL BUCKLE;  Surgeon: Megan Pedro, MD;  Location: [facility];  Service: Ophthalmology;  Laterality: Right;  . Gas insertion Right 07/19/2014    Procedure: INSERTION OF GAS;  Surgeon: Megan Pedro, MD;  Location: [facility];  Service: Ophthalmology;  Laterality: Right;  C3F8  . Laser photo ablation Right 07/19/2014    Procedure: LASER PHOTO ABLATION;  Surgeon: Megan Pedro, MD;  Location: [facility];  Service: Ophthalmology;  Laterality: Right;    History reviewed. No pertinent family history. Social History:  reports that she quit smoking about 21 years ago. Her smoking use included Cigarettes. She has a .75 pack-year smoking history. She has never used smokeless tobacco. She reports that she does not drink alcohol or use illicit drugs.  Allergies:  Allergies  Allergen Reactions  . Atenolol     REACTION: Headaches  .  Benazepril Hcl     REACTION: Dizziness  . Furosemide     REACTION: Hypernatremia  . Nsaids     REACTION: Has chronic renal disease    Medications Prior to Admission  Medication Sig Dispense Refill  . amLODipine (NORVASC) 10 MG tablet Take 10 mg by mouth daily.    Marland Kitchen aspirin EC 81 MG tablet Take 81 mg by mouth daily.    . carvedilol (COREG) 25 MG tablet Take 25 mg by mouth 2 (two) times daily with a meal.    . cloNIDine (CATAPRES - DOSED IN MG/24 HR) 0.3 mg/24hr patch Place 0.3 mg onto the skin once a week. Friday    . ferrous sulfate 325 (65 FE) MG tablet Take 325 mg by mouth daily with breakfast.    . losartan (COZAAR) 100 MG tablet Take 100 mg by mouth daily.    . meclizine (ANTIVERT) 25 MG tablet Take 1 tablet (25 mg total) by mouth 3 (three) times daily as needed for dizziness. 15 tablet 0    No results found for this or any previous visit (from the past 48 hour(s)).  No results found.   A comprehensive review of systems was negative except for: Eyes: positive for contacts/glasses  Blood pressure 168/61, pulse 55, temperature 97.7 F (36.5 C), temperature source Oral, height 5\' 7"  (1.702 m),  weight 73.936 kg (163 lb), SpO2 100 %.  General appearance: alert, cooperative and appears stated age Head: Normocephalic, without obvious abnormality, atraumatic Neck: supple, symmetrical, trachea midline Resp: clear to auscultation bilaterally Cardio: regular rate and rhythm GI: non tender Extremities: intact sensation and capillary refill all digits. +epl/fpl/io.  no wounds. Pulses: 2+ and symmetric Skin: Skin color, texture, turgor normal. No rashes or lesions Neurologic: Grossly normal Incision/Wound: none  Assessment/Plan Left wrist volar ganglion cyst.  Non operative and operative treatment options were discussed with the patient and patient wishes to proceed with operative treatment. Risks, benefits, and alternatives of surgery were discussed and the patient agrees with the  plan of care.   Megan Stevenson 02/02/2015, 8:48 AM

## 2015-02-02 NOTE — Discharge Instructions (Addendum)

## 2015-02-02 NOTE — Anesthesia Postprocedure Evaluation (Signed)
  Anesthesia Post-op Note  Patient: Megan Stevenson  Procedure(s) Performed: Procedure(s) (LRB): EXCISION OF VOLAR GANGLION CYST LEFT WRIST (Left)  Patient Location: PACU  Anesthesia Type: General  Level of Consciousness: awake and alert   Airway and Oxygen Therapy: Patient Spontanous Breathing  Post-op Pain: mild  Post-op Assessment: Post-op Vital signs reviewed, Patient's Cardiovascular Status Stable, Respiratory Function Stable, Patent Airway and No signs of Nausea or vomiting  Last Vitals:  Filed Vitals:   02/02/15 1015  BP: 153/65  Pulse: 60  Temp:   Resp: 12    Post-op Vital Signs: stable   Complications: No apparent anesthesia complications

## 2015-02-02 NOTE — Anesthesia Procedure Notes (Signed)
Procedure Name: LMA Insertion Performed by: Terrance Mass Pre-anesthesia Checklist: Patient identified, Patient being monitored, Emergency Drugs available, Timeout performed and Suction available Patient Re-evaluated:Patient Re-evaluated prior to inductionOxygen Delivery Method: Circle system utilized Preoxygenation: Pre-oxygenation with 100% oxygen Intubation Type: IV induction Ventilation: Mask ventilation without difficulty LMA: LMA inserted LMA Size: 4.0 Tube type: Oral Number of attempts: 1 Placement Confirmation: positive ETCO2 Tube secured with: Tape Dental Injury: Teeth and Oropharynx as per pre-operative assessment

## 2015-02-02 NOTE — Anesthesia Preprocedure Evaluation (Signed)
Anesthesia Evaluation  Patient identified by MRN, date of birth, ID band Patient awake    Reviewed: Allergy & Precautions, NPO status , Patient's Chart, lab work & pertinent test results  Airway Mallampati: II  TM Distance: >3 FB Neck ROM: Full    Dental no notable dental hx.    Pulmonary neg pulmonary ROS, former smoker,  breath sounds clear to auscultation  Pulmonary exam normal       Cardiovascular hypertension, Pt. on medications Rhythm:Regular Rate:Normal     Neuro/Psych negative neurological ROS  negative psych ROS   GI/Hepatic negative GI ROS, Neg liver ROS,   Endo/Other  diabetes  Renal/GU Renal InsufficiencyRenal disease  negative genitourinary   Musculoskeletal negative musculoskeletal ROS (+)   Abdominal   Peds negative pediatric ROS (+)  Hematology negative hematology ROS (+)   Anesthesia Other Findings   Reproductive/Obstetrics negative OB ROS                             Anesthesia Physical Anesthesia Plan  ASA: II  Anesthesia Plan: General   Post-op Pain Management:    Induction: Intravenous  Airway Management Planned: LMA  Additional Equipment:   Intra-op Plan:   Post-operative Plan: Extubation in OR  Informed Consent: I have reviewed the patients History and Physical, chart, labs and discussed the procedure including the risks, benefits and alternatives for the proposed anesthesia with the patient or authorized representative who has indicated his/her understanding and acceptance.   Dental advisory given  Plan Discussed with: CRNA and Surgeon  Anesthesia Plan Comments:         Anesthesia Quick Evaluation

## 2015-02-02 NOTE — Transfer of Care (Signed)
Immediate Anesthesia Transfer of Care Note  Patient: Megan Stevenson  Procedure(s) Performed: Procedure(s): EXCISION OF VOLAR GANGLION CYST LEFT WRIST (Left)  Patient Location: PACU  Anesthesia Type:General  Level of Consciousness: awake and alert   Airway & Oxygen Therapy: Patient Spontanous Breathing and Patient connected to face mask oxygen  Post-op Assessment: Report given to RN and Post -op Vital signs reviewed and stable  Post vital signs: Reviewed and stable  Last Vitals:  Filed Vitals:   02/02/15 0805  BP: 168/61  Pulse: 55  Temp: [id number] C    Complications: No apparent anesthesia complications

## 2015-02-02 NOTE — Brief Op Note (Signed)
02/02/2015  10:14 AM  PATIENT:  Megan Stevenson  74 y.o. female  PRE-OPERATIVE DIAGNOSIS:  LEFT WRIST VOLAR GANGLION CYST  POST-OPERATIVE DIAGNOSIS:  LEFT WRIST VOLAR GANGLION CYST  PROCEDURE:  Procedure(s): EXCISION OF VOLAR GANGLION CYST LEFT WRIST (Left)  SURGEON:  Surgeon(s) and Role:    * Leanora Cover, MD - Primary  PHYSICIAN ASSISTANT:   ASSISTANTS: none   ANESTHESIA:   general  EBL:  Total I/O In: 500 [I.V.:500] Out: -   BLOOD ADMINISTERED:none  DRAINS: none   LOCAL MEDICATIONS USED:  MARCAINE     SPECIMEN:  Source of Specimen:  left wrist  DISPOSITION OF SPECIMEN:  PATHOLOGY  COUNTS:  YES  TOURNIQUET:   Total Tourniquet Time Documented: Upper Arm (Left) - 18 minutes Total: Upper Arm (Left) - 18 minutes   DICTATION: .Other Dictation: Dictation Number [id number]  PLAN OF CARE: Discharge to home after PACU  PATIENT DISPOSITION:  PACU - hemodynamically stable.

## 2015-02-02 NOTE — Op Note (Signed)
[id number] 

## 2015-02-03 NOTE — Op Note (Signed)
Megan Stevenson, Megan Stevenson                ACCOUNT NO.:  [id number]  MEDICAL RECORD NO.:  [id number]  LOCATION:                                 FACILITY:  PHYSICIAN:  Leanora Cover, MD        DATE OF BIRTH:  September 10, 1941  DATE OF PROCEDURE:  02/02/2015 DATE OF DISCHARGE:                              OPERATIVE REPORT   PREOPERATIVE DIAGNOSIS:  Left recurrent volar ganglion cyst.  POSTOPERATIVE DIAGNOSIS:  Left recurrent volar ganglion cyst.  PROCEDURE:  Excision of ganglion, left volar wrist.  SURGEON:  Leanora Cover, MD  ASSISTANT:  None.  ANESTHESIA:  General.  IV FLUIDS:  Per anesthesia flow sheet.  ESTIMATED BLOOD LOSS:  Minimal.  COMPLICATIONS:  None.  SPECIMENS:  None.  TOURNIQUET TIME:  18 minutes.  DISPOSITION:  Stable to PACU.  INDICATIONS:  Megan Stevenson is a 74 year old female who has had a mass in the volar aspect of the left wrist for approximately 4 to 5 years.  She has had this excised before, but it recurred.  She wishes to have it removed again.  Risks, benefits, and alternatives of surgery were discussed including risk of blood loss, infection, damage to nerves, vessels, tendons, ligaments, bone; failure of surgery; need for additional surgery, complications with wound healing, continued pain, and recurrence of mass.  She voiced understanding of these risks and elected to proceed.  OPERATIVE COURSE:  After being identified preoperatively by myself, the patient and I agreed upon procedure and site of procedure.  Surgical site was marked.  The risks, benefits, and alternatives of surgery were reviewed and she wished to proceed.  Surgical consent had been signed. She was given IV Ancef as preoperative antibiotic prophylaxis.  She was transported to the operating room and placed on the operating room table in a supine position with left upper extremity on arm board.  General anesthesia was induced by anesthesiologist.  Left upper extremity was prepped and draped in  normal sterile orthopedic fashion.  Surgical pause was performed between surgeons, anesthesia, and operating room staff, and all were in agreement as to the patient, procedure, site of procedure.  Tourniquet at the proximal aspect of the extremity was inflated to 250 mmHg after exsanguination of the limb with an Esmarch bandage.  A Brunner-type incision was made over the mass and carried into subcutaneous tissues by spreading technique.  The mass was easily identified.  There was scar around it.  This carefully freed up.  Care was taken to protect the radial artery.  The stalk of the mass was traced down to the joint.  It was removed and sent to Pathology for examination.  A 4-0 Vicryl suture was placed in a figure-of-eight fashion at the rent in the capsule to try and prevent cyst recurrence. The wound was copiously irrigated with sterile saline and was closed with 4-0 nylon in a horizontal mattress fashion.  It was injected with 6 mL of 0.25% plain Marcaine to aid in postoperative analgesia.  It was then dressed with sterile Xeroform, 4x4s, and ABD and wrapped with Kerlix and Ace bandage.  Tourniquet was deflated at 18 minutes. Fingertips were pink with brisk capillary refill  after deflation of the tourniquet.  Operative drapes were broken down.  The patient was awoken from anesthesia safely.  She was transferred back to the stretcher and taken to the PACU in stable condition.  I will see her back in the office in 1 week for postoperative followup.  I will give her Norco 5/325, 1-2 p.o. q.6 hours p.r.n. pain, dispensed #30.     Leanora Cover, MD     KK/MEDQ  D:  02/02/2015  T:  02/03/2015  Job:  [id number]

## 2015-02-06 ENCOUNTER — Encounter (HOSPITAL_BASED_OUTPATIENT_CLINIC_OR_DEPARTMENT_OTHER): Payer: Self-pay | Admitting: Orthopedic Surgery

## 2015-02-06 LAB — POCT HEMOGLOBIN-HEMACUE: Hemoglobin: 10.3 g/dL — ABNORMAL LOW (ref 12.0–15.0)

## 2015-03-28 DIAGNOSIS — I10 Essential (primary) hypertension: Secondary | ICD-10-CM | POA: Diagnosis not present

## 2015-03-28 DIAGNOSIS — N184 Chronic kidney disease, stage 4 (severe): Secondary | ICD-10-CM | POA: Diagnosis not present

## 2015-03-28 DIAGNOSIS — E119 Type 2 diabetes mellitus without complications: Secondary | ICD-10-CM | POA: Diagnosis not present

## 2015-03-28 DIAGNOSIS — Z6825 Body mass index (BMI) 25.0-25.9, adult: Secondary | ICD-10-CM | POA: Diagnosis not present

## 2015-03-28 DIAGNOSIS — N183 Chronic kidney disease, stage 3 (moderate): Secondary | ICD-10-CM | POA: Diagnosis not present

## 2015-03-28 DIAGNOSIS — M1990 Unspecified osteoarthritis, unspecified site: Secondary | ICD-10-CM | POA: Diagnosis not present

## 2015-04-28 ENCOUNTER — Ambulatory Visit (INDEPENDENT_AMBULATORY_CARE_PROVIDER_SITE_OTHER): Payer: Medicare Other | Admitting: Ophthalmology

## 2015-04-28 DIAGNOSIS — H338 Other retinal detachments: Secondary | ICD-10-CM | POA: Diagnosis not present

## 2015-04-28 DIAGNOSIS — H43812 Vitreous degeneration, left eye: Secondary | ICD-10-CM

## 2015-04-28 DIAGNOSIS — I10 Essential (primary) hypertension: Secondary | ICD-10-CM

## 2015-04-28 DIAGNOSIS — E11319 Type 2 diabetes mellitus with unspecified diabetic retinopathy without macular edema: Secondary | ICD-10-CM | POA: Diagnosis not present

## 2015-04-28 DIAGNOSIS — H35033 Hypertensive retinopathy, bilateral: Secondary | ICD-10-CM | POA: Diagnosis not present

## 2015-04-28 DIAGNOSIS — H35341 Macular cyst, hole, or pseudohole, right eye: Secondary | ICD-10-CM | POA: Diagnosis not present

## 2015-04-28 DIAGNOSIS — E11329 Type 2 diabetes mellitus with mild nonproliferative diabetic retinopathy without macular edema: Secondary | ICD-10-CM | POA: Diagnosis not present

## 2015-06-23 DIAGNOSIS — I10 Essential (primary) hypertension: Secondary | ICD-10-CM | POA: Diagnosis not present

## 2015-06-23 DIAGNOSIS — N183 Chronic kidney disease, stage 3 (moderate): Secondary | ICD-10-CM | POA: Diagnosis not present

## 2015-07-21 DIAGNOSIS — N183 Chronic kidney disease, stage 3 (moderate): Secondary | ICD-10-CM | POA: Diagnosis not present

## 2015-07-21 DIAGNOSIS — I10 Essential (primary) hypertension: Secondary | ICD-10-CM | POA: Diagnosis not present

## 2015-08-04 DIAGNOSIS — N183 Chronic kidney disease, stage 3 (moderate): Secondary | ICD-10-CM | POA: Diagnosis not present

## 2015-08-04 DIAGNOSIS — E119 Type 2 diabetes mellitus without complications: Secondary | ICD-10-CM | POA: Diagnosis not present

## 2015-08-04 DIAGNOSIS — I10 Essential (primary) hypertension: Secondary | ICD-10-CM | POA: Diagnosis not present

## 2015-08-04 DIAGNOSIS — Z6825 Body mass index (BMI) 25.0-25.9, adult: Secondary | ICD-10-CM | POA: Diagnosis not present

## 2015-08-04 DIAGNOSIS — I129 Hypertensive chronic kidney disease with stage 1 through stage 4 chronic kidney disease, or unspecified chronic kidney disease: Secondary | ICD-10-CM | POA: Diagnosis not present

## 2015-08-29 DIAGNOSIS — I10 Essential (primary) hypertension: Secondary | ICD-10-CM | POA: Diagnosis not present

## 2015-11-27 DIAGNOSIS — N390 Urinary tract infection, site not specified: Secondary | ICD-10-CM | POA: Diagnosis not present

## 2015-11-27 DIAGNOSIS — R8290 Unspecified abnormal findings in urine: Secondary | ICD-10-CM | POA: Diagnosis not present

## 2015-11-27 DIAGNOSIS — D509 Iron deficiency anemia, unspecified: Secondary | ICD-10-CM | POA: Diagnosis not present

## 2015-11-27 DIAGNOSIS — E119 Type 2 diabetes mellitus without complications: Secondary | ICD-10-CM | POA: Diagnosis not present

## 2015-11-27 DIAGNOSIS — I10 Essential (primary) hypertension: Secondary | ICD-10-CM | POA: Diagnosis not present

## 2015-11-27 DIAGNOSIS — M109 Gout, unspecified: Secondary | ICD-10-CM | POA: Diagnosis not present

## 2016-01-09 DIAGNOSIS — E784 Other hyperlipidemia: Secondary | ICD-10-CM | POA: Diagnosis not present

## 2016-01-09 DIAGNOSIS — I129 Hypertensive chronic kidney disease with stage 1 through stage 4 chronic kidney disease, or unspecified chronic kidney disease: Secondary | ICD-10-CM | POA: Diagnosis not present

## 2016-01-09 DIAGNOSIS — M25512 Pain in left shoulder: Secondary | ICD-10-CM | POA: Diagnosis not present

## 2016-01-09 DIAGNOSIS — N183 Chronic kidney disease, stage 3 (moderate): Secondary | ICD-10-CM | POA: Diagnosis not present

## 2016-01-09 DIAGNOSIS — Z0000 Encounter for general adult medical examination without abnormal findings: Secondary | ICD-10-CM | POA: Diagnosis not present

## 2016-01-09 DIAGNOSIS — I10 Essential (primary) hypertension: Secondary | ICD-10-CM | POA: Diagnosis not present

## 2016-01-09 DIAGNOSIS — Z6825 Body mass index (BMI) 25.0-25.9, adult: Secondary | ICD-10-CM | POA: Diagnosis not present

## 2016-01-09 DIAGNOSIS — M109 Gout, unspecified: Secondary | ICD-10-CM | POA: Diagnosis not present

## 2016-01-09 DIAGNOSIS — D509 Iron deficiency anemia, unspecified: Secondary | ICD-10-CM | POA: Diagnosis not present

## 2016-01-09 DIAGNOSIS — Z1389 Encounter for screening for other disorder: Secondary | ICD-10-CM | POA: Diagnosis not present

## 2016-01-09 DIAGNOSIS — K219 Gastro-esophageal reflux disease without esophagitis: Secondary | ICD-10-CM | POA: Diagnosis not present

## 2016-01-09 DIAGNOSIS — E119 Type 2 diabetes mellitus without complications: Secondary | ICD-10-CM | POA: Diagnosis not present

## 2016-01-16 DIAGNOSIS — E119 Type 2 diabetes mellitus without complications: Secondary | ICD-10-CM | POA: Diagnosis not present

## 2016-01-16 DIAGNOSIS — I10 Essential (primary) hypertension: Secondary | ICD-10-CM | POA: Diagnosis not present

## 2016-01-16 DIAGNOSIS — H5203 Hypermetropia, bilateral: Secondary | ICD-10-CM | POA: Diagnosis not present

## 2016-01-16 DIAGNOSIS — H35341 Macular cyst, hole, or pseudohole, right eye: Secondary | ICD-10-CM | POA: Diagnosis not present

## 2016-01-16 DIAGNOSIS — Z961 Presence of intraocular lens: Secondary | ICD-10-CM | POA: Diagnosis not present

## 2016-01-16 DIAGNOSIS — H52223 Regular astigmatism, bilateral: Secondary | ICD-10-CM | POA: Diagnosis not present

## 2016-01-16 DIAGNOSIS — H524 Presbyopia: Secondary | ICD-10-CM | POA: Diagnosis not present

## 2016-04-08 DIAGNOSIS — I10 Essential (primary) hypertension: Secondary | ICD-10-CM | POA: Diagnosis not present

## 2016-04-08 DIAGNOSIS — M25569 Pain in unspecified knee: Secondary | ICD-10-CM | POA: Diagnosis not present

## 2016-04-08 DIAGNOSIS — Z780 Asymptomatic menopausal state: Secondary | ICD-10-CM | POA: Diagnosis not present

## 2016-04-08 DIAGNOSIS — N183 Chronic kidney disease, stage 3 (moderate): Secondary | ICD-10-CM | POA: Diagnosis not present

## 2016-04-08 DIAGNOSIS — E784 Other hyperlipidemia: Secondary | ICD-10-CM | POA: Diagnosis not present

## 2016-04-08 DIAGNOSIS — E119 Type 2 diabetes mellitus without complications: Secondary | ICD-10-CM | POA: Diagnosis not present

## 2016-04-08 DIAGNOSIS — Z6825 Body mass index (BMI) 25.0-25.9, adult: Secondary | ICD-10-CM | POA: Diagnosis not present

## 2016-04-29 ENCOUNTER — Ambulatory Visit (INDEPENDENT_AMBULATORY_CARE_PROVIDER_SITE_OTHER): Payer: Medicare Other | Admitting: Ophthalmology

## 2016-04-29 DIAGNOSIS — H43812 Vitreous degeneration, left eye: Secondary | ICD-10-CM

## 2016-04-29 DIAGNOSIS — H35341 Macular cyst, hole, or pseudohole, right eye: Secondary | ICD-10-CM

## 2016-04-29 DIAGNOSIS — H338 Other retinal detachments: Secondary | ICD-10-CM

## 2016-04-29 DIAGNOSIS — I10 Essential (primary) hypertension: Secondary | ICD-10-CM

## 2016-04-29 DIAGNOSIS — H35033 Hypertensive retinopathy, bilateral: Secondary | ICD-10-CM

## 2016-07-22 DIAGNOSIS — N183 Chronic kidney disease, stage 3 (moderate): Secondary | ICD-10-CM | POA: Diagnosis not present

## 2016-08-01 DIAGNOSIS — N183 Chronic kidney disease, stage 3 (moderate): Secondary | ICD-10-CM | POA: Diagnosis not present

## 2016-08-01 DIAGNOSIS — I10 Essential (primary) hypertension: Secondary | ICD-10-CM | POA: Diagnosis not present

## 2016-08-13 DIAGNOSIS — M109 Gout, unspecified: Secondary | ICD-10-CM | POA: Diagnosis not present

## 2016-08-13 DIAGNOSIS — I10 Essential (primary) hypertension: Secondary | ICD-10-CM | POA: Diagnosis not present

## 2016-08-13 DIAGNOSIS — Z6825 Body mass index (BMI) 25.0-25.9, adult: Secondary | ICD-10-CM | POA: Diagnosis not present

## 2016-08-13 DIAGNOSIS — N183 Chronic kidney disease, stage 3 (moderate): Secondary | ICD-10-CM | POA: Diagnosis not present

## 2016-08-13 DIAGNOSIS — E119 Type 2 diabetes mellitus without complications: Secondary | ICD-10-CM | POA: Diagnosis not present

## 2016-08-13 DIAGNOSIS — E784 Other hyperlipidemia: Secondary | ICD-10-CM | POA: Diagnosis not present

## 2016-09-13 DIAGNOSIS — H8112 Benign paroxysmal vertigo, left ear: Secondary | ICD-10-CM | POA: Diagnosis not present

## 2016-09-13 DIAGNOSIS — M5431 Sciatica, right side: Secondary | ICD-10-CM | POA: Diagnosis not present

## 2016-09-13 DIAGNOSIS — Z6824 Body mass index (BMI) 24.0-24.9, adult: Secondary | ICD-10-CM | POA: Diagnosis not present

## 2016-09-13 DIAGNOSIS — R42 Dizziness and giddiness: Secondary | ICD-10-CM | POA: Diagnosis not present

## 2016-09-13 DIAGNOSIS — I129 Hypertensive chronic kidney disease with stage 1 through stage 4 chronic kidney disease, or unspecified chronic kidney disease: Secondary | ICD-10-CM | POA: Diagnosis not present

## 2016-12-16 DIAGNOSIS — I10 Essential (primary) hypertension: Secondary | ICD-10-CM | POA: Diagnosis not present

## 2016-12-16 DIAGNOSIS — G608 Other hereditary and idiopathic neuropathies: Secondary | ICD-10-CM | POA: Diagnosis not present

## 2016-12-16 DIAGNOSIS — G609 Hereditary and idiopathic neuropathy, unspecified: Secondary | ICD-10-CM | POA: Diagnosis not present

## 2016-12-16 DIAGNOSIS — D509 Iron deficiency anemia, unspecified: Secondary | ICD-10-CM | POA: Diagnosis not present

## 2016-12-16 DIAGNOSIS — Z6825 Body mass index (BMI) 25.0-25.9, adult: Secondary | ICD-10-CM | POA: Diagnosis not present

## 2017-01-07 DIAGNOSIS — E119 Type 2 diabetes mellitus without complications: Secondary | ICD-10-CM | POA: Diagnosis not present

## 2017-01-07 DIAGNOSIS — E784 Other hyperlipidemia: Secondary | ICD-10-CM | POA: Diagnosis not present

## 2017-01-07 DIAGNOSIS — M109 Gout, unspecified: Secondary | ICD-10-CM | POA: Diagnosis not present

## 2017-01-07 DIAGNOSIS — R8299 Other abnormal findings in urine: Secondary | ICD-10-CM | POA: Diagnosis not present

## 2017-01-14 DIAGNOSIS — E119 Type 2 diabetes mellitus without complications: Secondary | ICD-10-CM | POA: Diagnosis not present

## 2017-01-14 DIAGNOSIS — M1990 Unspecified osteoarthritis, unspecified site: Secondary | ICD-10-CM | POA: Diagnosis not present

## 2017-01-14 DIAGNOSIS — E784 Other hyperlipidemia: Secondary | ICD-10-CM | POA: Diagnosis not present

## 2017-01-14 DIAGNOSIS — H3530 Unspecified macular degeneration: Secondary | ICD-10-CM | POA: Diagnosis not present

## 2017-01-14 DIAGNOSIS — N183 Chronic kidney disease, stage 3 (moderate): Secondary | ICD-10-CM | POA: Diagnosis not present

## 2017-01-14 DIAGNOSIS — I10 Essential (primary) hypertension: Secondary | ICD-10-CM | POA: Diagnosis not present

## 2017-01-14 DIAGNOSIS — Z1389 Encounter for screening for other disorder: Secondary | ICD-10-CM | POA: Diagnosis not present

## 2017-01-14 DIAGNOSIS — M109 Gout, unspecified: Secondary | ICD-10-CM | POA: Diagnosis not present

## 2017-01-14 DIAGNOSIS — Z0000 Encounter for general adult medical examination without abnormal findings: Secondary | ICD-10-CM | POA: Diagnosis not present

## 2017-01-14 DIAGNOSIS — R413 Other amnesia: Secondary | ICD-10-CM | POA: Diagnosis not present

## 2017-01-14 DIAGNOSIS — Z6826 Body mass index (BMI) 26.0-26.9, adult: Secondary | ICD-10-CM | POA: Diagnosis not present

## 2017-02-10 DIAGNOSIS — E119 Type 2 diabetes mellitus without complications: Secondary | ICD-10-CM | POA: Diagnosis not present

## 2017-03-19 DIAGNOSIS — M79672 Pain in left foot: Secondary | ICD-10-CM | POA: Diagnosis not present

## 2017-03-19 DIAGNOSIS — Z6826 Body mass index (BMI) 26.0-26.9, adult: Secondary | ICD-10-CM | POA: Diagnosis not present

## 2017-05-02 ENCOUNTER — Ambulatory Visit (INDEPENDENT_AMBULATORY_CARE_PROVIDER_SITE_OTHER): Payer: Medicare Other | Admitting: Ophthalmology

## 2017-06-04 ENCOUNTER — Ambulatory Visit (INDEPENDENT_AMBULATORY_CARE_PROVIDER_SITE_OTHER): Payer: Medicare Other | Admitting: Ophthalmology

## 2017-06-25 ENCOUNTER — Ambulatory Visit (INDEPENDENT_AMBULATORY_CARE_PROVIDER_SITE_OTHER): Payer: Medicare Other | Admitting: Ophthalmology

## 2017-06-25 DIAGNOSIS — I10 Essential (primary) hypertension: Secondary | ICD-10-CM | POA: Diagnosis not present

## 2017-06-25 DIAGNOSIS — H35033 Hypertensive retinopathy, bilateral: Secondary | ICD-10-CM

## 2017-06-25 DIAGNOSIS — H43812 Vitreous degeneration, left eye: Secondary | ICD-10-CM | POA: Diagnosis not present

## 2017-06-25 DIAGNOSIS — H35341 Macular cyst, hole, or pseudohole, right eye: Secondary | ICD-10-CM | POA: Diagnosis not present

## 2017-06-25 DIAGNOSIS — H338 Other retinal detachments: Secondary | ICD-10-CM

## 2017-07-15 ENCOUNTER — Encounter (HOSPITAL_COMMUNITY): Payer: Self-pay | Admitting: Emergency Medicine

## 2017-07-15 ENCOUNTER — Emergency Department (HOSPITAL_COMMUNITY)
Admission: EM | Admit: 2017-07-15 | Discharge: 2017-07-15 | Discharge status: Against Medical Advice | Payer: Medicare Other | Attending: Emergency Medicine | Admitting: Emergency Medicine

## 2017-07-15 DIAGNOSIS — M79671 Pain in right foot: Secondary | ICD-10-CM

## 2017-07-15 DIAGNOSIS — Z7982 Long term (current) use of aspirin: Secondary | ICD-10-CM | POA: Insufficient documentation

## 2017-07-15 DIAGNOSIS — Z79899 Other long term (current) drug therapy: Secondary | ICD-10-CM | POA: Diagnosis not present

## 2017-07-15 DIAGNOSIS — M79672 Pain in left foot: Secondary | ICD-10-CM | POA: Diagnosis not present

## 2017-07-15 DIAGNOSIS — Z87891 Personal history of nicotine dependence: Secondary | ICD-10-CM | POA: Diagnosis not present

## 2017-07-15 DIAGNOSIS — I10 Essential (primary) hypertension: Secondary | ICD-10-CM | POA: Diagnosis not present

## 2017-07-15 DIAGNOSIS — E119 Type 2 diabetes mellitus without complications: Secondary | ICD-10-CM | POA: Diagnosis not present

## 2017-07-15 NOTE — ED Provider Notes (Signed)
[facility] DEPT Provider Note   CSN: [id number] Arrival date & time: 07/15/17  1240  By signing my name below, I, Marcello Moores, attest that this documentation has been prepared under the direction and in the presence of [employer], PA-C. Electronically Signed: Marcello Moores, ED Scribe. 07/15/17. 4:06 PM.  History   Chief Complaint Chief Complaint  Patient presents with  . Foot Pain   The history is provided by the patient. No language interpreter was used.   HPI Comments: Megan Stevenson is a 76 y.o. female who presents to the Emergency Department complaining of persistent bilateral foot pain s/p two injuries where one occurred a month ago and the other occurred 2 days ago. Her left foot's pain occurred a month ago where she knocked over an ice bucket that landed on her foot. The pain on her right foot occurred 2 days ago, where her mattress fell and landed on the dorsum of her foot. The pt has been taking aspirin with inadequate relief. Ambulation exacerbates her pain. The pt denies numbness.     Past Medical History:  Diagnosis Date  . Anemia   . Arthritis   . Diabetes mellitus without complication (HCC)    no medications  . Dysrhythmia   . Hypertension   . Macular hole    OD  . Renal disorder    Sees someone [facility]  . Wears glasses     Patient Active Problem List   Diagnosis Date Noted  . 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 DIABETES MELLITUS, TYPE II 07/24/2007  . DYSLIPIDEMIA 07/24/2007  . 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  . ANEMIA-IRON DEFICIENCY 07/24/2007  . HYPERTENSION 07/24/2007  . PUD 07/24/2007  . 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 RENAL INSUFFICIENCY 07/24/2007  . ROTATOR CUFF SYNDROME, 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  . SUBACROMIAL BURSITIS, 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  . HEART MURMUR, HX OF 07/24/2007    . COLONIC POLYPS, HX OF 07/24/2007    Past Surgical History:  Procedure Laterality Date  . [place] VITRECTOMY WITH 20 GAUGE MVR PORT FOR MACULAR HOLE Right 06/14/2014   Procedure: 25 GAUGE PARS PLANA VITRECTOMY WITH 20 GAUGE MVR PORT FOR MACULAR HOLE; serum patch; gas injection; laser treament; C3F8 gas fluid exchange.;  Surgeon: Hayden Pedro, MD;  Location: [facility];  Service: Ophthalmology;  Laterality: Right;  . ABDOMINAL HYSTERECTOMY    . EYE SURGERY    . GANGLION CYST EXCISION Left 02/02/2015   Procedure: EXCISION OF VOLAR GANGLION CYST LEFT WRIST;  Surgeon: Leanora Cover, MD;  Location: [facility];  Service: Orthopedics;  Laterality: Left;  [place] GAS INSERTION Right 07/19/2014   Procedure: INSERTION OF GAS;  Surgeon: Hayden Pedro, MD;  Location: [facility];  Service: Ophthalmology;  Laterality: Right;  C3F8  . LASER PHOTO ABLATION Right 07/19/2014   Procedure: LASER PHOTO ABLATION;  Surgeon: Hayden Pedro, MD;  Location: [facility];  Service: Ophthalmology;  Laterality: Right;  . PARS PLANA VITRECTOMY W/ REPAIR OF MACULAR HOLE Right 06/14/2014   DR MATTHEWS  . SCLERAL BUCKLE Right 07/19/2014   Procedure: SCLERAL BUCKLE;  Surgeon: Hayden Pedro, MD;  Location: [facility];  Service: Ophthalmology;  Laterality: Right;    OB History    No data available       Home Medications    Prior to Admission medications   Medication Sig  Start Date End Date Taking? Authorizing Provider  amLODipine (NORVASC) 10 MG tablet Take 10 mg by mouth daily.    [provider]  aspirin EC 81 MG tablet Take 81 mg by mouth daily.    [provider]  carvedilol (COREG) 25 MG tablet Take 25 mg by mouth 2 (two) times daily with a meal.    [provider]  cloNIDine (CATAPRES - DOSED IN MG/24 HR) 0.3 mg/24hr patch Place 0.3 mg onto the skin once a week. Friday    [provider]  ferrous sulfate 325 (65 FE) MG tablet Take 325 mg by mouth daily with breakfast.     [provider]  HYDROcodone-acetaminophen (NORCO) 5-325 MG per tablet 1-2 tabs po q6 hours prn pain 02/02/15   Leanora Cover, MD  losartan (COZAAR) 100 MG tablet Take 100 mg by mouth daily.    [provider]  meclizine (ANTIVERT) 25 MG tablet Take 1 tablet (25 mg total) by mouth 3 (three) times daily as needed for dizziness. 12/04/13   Francine Graven, DO    Family History No family history on file.  Social History Social History  Substance Use Topics  . Smoking status: Former Smoker    Packs/day: 0.25    Years: 3.00    Types: Cigarettes    Quit date: 12/09/1993  . Smokeless tobacco: Never Used  . Alcohol use No     Allergies   Atenolol; Benazepril hcl; Furosemide; and Nsaids   Review of Systems Review of Systems  Musculoskeletal: Positive for myalgias.  Neurological: Negative for numbness.     Physical Exam Updated Vital Signs BP 134/72 (BP Location: Left Arm)   Pulse (!) 57   Temp 98.3 F (36.8 C) (Oral)   Resp 18   Ht 5\' 6"  (1.676 m)   Wt 72.6 kg (160 lb)   SpO2 98%   BMI 25.82 kg/m   Physical Exam  Constitutional: She appears well-developed and well-nourished. No distress.  HENT:  Head: Normocephalic and atraumatic.  Mouth/Throat: Oropharynx is clear and moist. No oropharyngeal exudate.  Eyes: Pupils are equal, round, and reactive to light. Conjunctivae are normal. Right eye exhibits no discharge. Left eye exhibits no discharge. No scleral icterus.  Neck: Normal range of motion. Neck supple. No thyromegaly present.  Cardiovascular: Normal rate, regular rhythm, normal heart sounds and intact distal pulses.  Exam reveals no gallop and no friction rub.   No murmur heard. Pulmonary/Chest: Effort normal and breath sounds normal. No stridor. No respiratory distress. She has no wheezes. She has no rales.  Musculoskeletal: She exhibits no edema.  Right foot: area of erythema. Tenderness and swelling to mid-foot. Left foot: healing ecchymosis to  dorsal lateral mid-foot. Tenderness present. Normal sensation and cap refill intact bilaterally. Full ROM of all digits. Bilateral ankles.  Lymphadenopathy:    She has no cervical adenopathy.  Neurological: She is alert. Coordination normal.  Skin: Skin is warm and dry. No rash noted. She is not diaphoretic. No pallor.  Psychiatric: She has a normal mood and affect.  Nursing note and vitals reviewed.    ED Treatments / Results   DIAGNOSTIC STUDIES: Oxygen Saturation is 98% on RA, normal by my interpretation.   COORDINATION OF CARE: 2:19 PM-Discussed next steps with pt. Pt verbalized understanding and is agreeable with the plan.   Labs (all labs ordered are listed, but only abnormal results are displayed) Labs Reviewed - No data to display  EKG  EKG Interpretation None  Radiology No results found.  Procedures Procedures (including critical care time)  Medications Ordered in ED Medications - No data to display   Initial Impression / Assessment and Plan / ED Course  I have reviewed the triage vital signs and the nursing notes.  Pertinent labs & imaging results that were available during my care of the patient were reviewed by me and considered in my medical decision making (see chart for details).     Patient leaving [facility] because she did not want to wait for her x-rays. I discussed possibility of missing a fracture in the wrists associated. Patient follow-up with PCP or podiatrist for further evaluation and return precautions discussed. I advised ice, elevation, and Tylenol as needed for pain. Patient understands and agrees with plan. Patient vitals stable throughout ED course and discharged in satisfactory condition.  Final Clinical Impressions(s) / ED Diagnoses   Final diagnoses:  Bilateral foot pain    New Prescriptions New Prescriptions   No medications on file   I personally performed the services described in this documentation,  which was scribed in my presence. The recorded information has been reviewed and is accurate.     Frederica Kuster, PA-C 07/15/17 1606    Dorie Rank, MD 07/17/17 [place]

## 2017-07-15 NOTE — ED Notes (Signed)
Pt adamant about leaving without x-rays. Armstead Peaks, PA made aware. Pt left AMA.

## 2017-07-15 NOTE — Discharge Instructions (Signed)
Please follow-up with your doctor or the podiatrist, Dr. Amalia Hailey, for further evaluation and treatment. You can take Tylenol as prescribed over-the-counter, as needed for your pain. Keep your foot elevated whenever you're not walking on it. Use ice 3-4 times daily alternating 20 minutes on, 20 minutes off. Please return to emergency department if he develop any new or worsening symptoms. Please note that you are leaving [facility] today and we recommended that you had an x-ray to rule out fracture or other finding. A missed fracture could cause continued pain or nonhealing.

## 2017-07-15 NOTE — ED Triage Notes (Signed)
Pt states  Dropped a CUP ON HER LEFT FOOT A MONTH AGO AND THEN  SAT DROPPED A CUP ON HER RT FOOT , STILL ABLE TO WALK BUT FEET HURT

## 2017-07-15 NOTE — ED Notes (Signed)
Pt calling out to leave ED. Pt informed that her x-rays were not done and that is what we are waiting on. Pt stated "i been here since 2 o'clock and I'm tired of waiting." Armstead Peaks, PA made aware and spoke with pt.

## 2017-10-01 DIAGNOSIS — N183 Chronic kidney disease, stage 3 (moderate): Secondary | ICD-10-CM | POA: Diagnosis not present

## 2017-11-07 DIAGNOSIS — I129 Hypertensive chronic kidney disease with stage 1 through stage 4 chronic kidney disease, or unspecified chronic kidney disease: Secondary | ICD-10-CM | POA: Diagnosis not present

## 2017-11-07 DIAGNOSIS — M25561 Pain in right knee: Secondary | ICD-10-CM | POA: Diagnosis not present

## 2017-11-07 DIAGNOSIS — M25562 Pain in left knee: Secondary | ICD-10-CM | POA: Diagnosis not present

## 2017-11-07 DIAGNOSIS — R413 Other amnesia: Secondary | ICD-10-CM | POA: Diagnosis not present

## 2017-11-07 DIAGNOSIS — Z6824 Body mass index (BMI) 24.0-24.9, adult: Secondary | ICD-10-CM | POA: Diagnosis not present

## 2017-11-11 ENCOUNTER — Encounter (HOSPITAL_COMMUNITY): Payer: Self-pay

## 2017-11-11 ENCOUNTER — Emergency Department (HOSPITAL_COMMUNITY)
Admission: EM | Admit: 2017-11-11 | Discharge: 2017-11-11 | Discharge status: Against Medical Advice | Payer: Medicare Other | Attending: Emergency Medicine | Admitting: Emergency Medicine

## 2017-11-11 ENCOUNTER — Other Ambulatory Visit: Payer: Self-pay

## 2017-11-11 ENCOUNTER — Emergency Department (HOSPITAL_COMMUNITY): Payer: Medicare Other

## 2017-11-11 DIAGNOSIS — Z5321 Procedure and treatment not carried out due to patient leaving prior to being seen by health care provider: Secondary | ICD-10-CM | POA: Diagnosis not present

## 2017-11-11 DIAGNOSIS — M1711 Unilateral primary osteoarthritis, right knee: Secondary | ICD-10-CM | POA: Diagnosis not present

## 2017-11-11 DIAGNOSIS — M25561 Pain in right knee: Secondary | ICD-10-CM | POA: Insufficient documentation

## 2017-11-11 NOTE — ED Triage Notes (Signed)
Patient complains of ongoing right knee pain for a while, denies injury, NAD

## 2017-12-30 DIAGNOSIS — I129 Hypertensive chronic kidney disease with stage 1 through stage 4 chronic kidney disease, or unspecified chronic kidney disease: Secondary | ICD-10-CM | POA: Diagnosis not present

## 2017-12-30 DIAGNOSIS — N183 Chronic kidney disease, stage 3 (moderate): Secondary | ICD-10-CM | POA: Diagnosis not present

## 2018-01-09 DIAGNOSIS — M1712 Unilateral primary osteoarthritis, left knee: Secondary | ICD-10-CM | POA: Diagnosis not present

## 2018-01-13 DIAGNOSIS — E7849 Other hyperlipidemia: Secondary | ICD-10-CM | POA: Diagnosis not present

## 2018-01-13 DIAGNOSIS — E119 Type 2 diabetes mellitus without complications: Secondary | ICD-10-CM | POA: Diagnosis not present

## 2018-01-13 DIAGNOSIS — N183 Chronic kidney disease, stage 3 (moderate): Secondary | ICD-10-CM | POA: Diagnosis not present

## 2018-01-13 DIAGNOSIS — M109 Gout, unspecified: Secondary | ICD-10-CM | POA: Diagnosis not present

## 2018-01-14 DIAGNOSIS — E7849 Other hyperlipidemia: Secondary | ICD-10-CM | POA: Diagnosis not present

## 2018-01-20 DIAGNOSIS — E1129 Type 2 diabetes mellitus with other diabetic kidney complication: Secondary | ICD-10-CM | POA: Diagnosis not present

## 2018-01-20 DIAGNOSIS — E7849 Other hyperlipidemia: Secondary | ICD-10-CM | POA: Diagnosis not present

## 2018-01-20 DIAGNOSIS — N183 Chronic kidney disease, stage 3 (moderate): Secondary | ICD-10-CM | POA: Diagnosis not present

## 2018-01-20 DIAGNOSIS — Z6823 Body mass index (BMI) 23.0-23.9, adult: Secondary | ICD-10-CM | POA: Diagnosis not present

## 2018-01-20 DIAGNOSIS — I10 Essential (primary) hypertension: Secondary | ICD-10-CM | POA: Diagnosis not present

## 2018-01-20 DIAGNOSIS — R808 Other proteinuria: Secondary | ICD-10-CM | POA: Diagnosis not present

## 2018-01-20 DIAGNOSIS — D508 Other iron deficiency anemias: Secondary | ICD-10-CM | POA: Diagnosis not present

## 2018-01-20 DIAGNOSIS — H3530 Unspecified macular degeneration: Secondary | ICD-10-CM | POA: Diagnosis not present

## 2018-01-20 DIAGNOSIS — Z1389 Encounter for screening for other disorder: Secondary | ICD-10-CM | POA: Diagnosis not present

## 2018-01-20 DIAGNOSIS — Z0000 Encounter for general adult medical examination without abnormal findings: Secondary | ICD-10-CM | POA: Diagnosis not present

## 2018-01-20 DIAGNOSIS — R413 Other amnesia: Secondary | ICD-10-CM | POA: Diagnosis not present

## 2018-01-20 DIAGNOSIS — Z23 Encounter for immunization: Secondary | ICD-10-CM | POA: Diagnosis not present

## 2018-03-02 DIAGNOSIS — T23261A Burn of second degree of back of right hand, initial encounter: Secondary | ICD-10-CM | POA: Diagnosis not present

## 2018-05-01 DIAGNOSIS — M1711 Unilateral primary osteoarthritis, right knee: Secondary | ICD-10-CM | POA: Diagnosis not present

## 2018-06-13 ENCOUNTER — Emergency Department (HOSPITAL_COMMUNITY): Payer: Medicare Other

## 2018-06-13 ENCOUNTER — Observation Stay (HOSPITAL_COMMUNITY)
Admission: EM | Admit: 2018-06-13 | Discharge: 2018-06-14 | Disposition: A | Discharge status: Home / Self Care | Payer: Medicare Other | Attending: Internal Medicine | Admitting: Internal Medicine

## 2018-06-13 ENCOUNTER — Encounter (HOSPITAL_COMMUNITY): Payer: Self-pay

## 2018-06-13 ENCOUNTER — Other Ambulatory Visit: Payer: Self-pay

## 2018-06-13 DIAGNOSIS — M109 Gout, unspecified: Secondary | ICD-10-CM | POA: Insufficient documentation

## 2018-06-13 DIAGNOSIS — Z886 Allergy status to analgesic agent status: Secondary | ICD-10-CM | POA: Insufficient documentation

## 2018-06-13 DIAGNOSIS — Z888 Allergy status to other drugs, medicaments and biological substances status: Secondary | ICD-10-CM | POA: Insufficient documentation

## 2018-06-13 DIAGNOSIS — D509 Iron deficiency anemia, unspecified: Secondary | ICD-10-CM | POA: Diagnosis not present

## 2018-06-13 DIAGNOSIS — I10 Essential (primary) hypertension: Secondary | ICD-10-CM

## 2018-06-13 DIAGNOSIS — E1122 Type 2 diabetes mellitus with diabetic chronic kidney disease: Secondary | ICD-10-CM | POA: Diagnosis not present

## 2018-06-13 DIAGNOSIS — N184 Chronic kidney disease, stage 4 (severe): Secondary | ICD-10-CM

## 2018-06-13 DIAGNOSIS — E119 Type 2 diabetes mellitus without complications: Secondary | ICD-10-CM

## 2018-06-13 DIAGNOSIS — R011 Cardiac murmur, unspecified: Secondary | ICD-10-CM | POA: Diagnosis present

## 2018-06-13 DIAGNOSIS — D631 Anemia in chronic kidney disease: Secondary | ICD-10-CM | POA: Insufficient documentation

## 2018-06-13 DIAGNOSIS — D649 Anemia, unspecified: Secondary | ICD-10-CM

## 2018-06-13 DIAGNOSIS — E7800 Pure hypercholesterolemia, unspecified: Secondary | ICD-10-CM | POA: Insufficient documentation

## 2018-06-13 DIAGNOSIS — R001 Bradycardia, unspecified: Secondary | ICD-10-CM | POA: Insufficient documentation

## 2018-06-13 DIAGNOSIS — Z87891 Personal history of nicotine dependence: Secondary | ICD-10-CM | POA: Diagnosis not present

## 2018-06-13 DIAGNOSIS — R270 Ataxia, unspecified: Secondary | ICD-10-CM

## 2018-06-13 DIAGNOSIS — N189 Chronic kidney disease, unspecified: Secondary | ICD-10-CM | POA: Diagnosis not present

## 2018-06-13 DIAGNOSIS — E1151 Type 2 diabetes mellitus with diabetic peripheral angiopathy without gangrene: Secondary | ICD-10-CM | POA: Diagnosis not present

## 2018-06-13 DIAGNOSIS — R42 Dizziness and giddiness: Secondary | ICD-10-CM | POA: Diagnosis not present

## 2018-06-13 DIAGNOSIS — Z79899 Other long term (current) drug therapy: Secondary | ICD-10-CM | POA: Diagnosis not present

## 2018-06-13 DIAGNOSIS — I129 Hypertensive chronic kidney disease with stage 1 through stage 4 chronic kidney disease, or unspecified chronic kidney disease: Secondary | ICD-10-CM | POA: Insufficient documentation

## 2018-06-13 HISTORY — DX: Type 2 diabetes mellitus with diabetic chronic kidney disease: E11.22

## 2018-06-13 HISTORY — DX: Ataxia, unspecified: R27.0

## 2018-06-13 HISTORY — DX: Chronic kidney disease, stage 4 (severe): N18.4

## 2018-06-13 HISTORY — DX: Bradycardia, unspecified: R00.1

## 2018-06-13 LAB — RETICULOCYTES
RBC.: 5.18 MIL/uL — ABNORMAL HIGH (ref 3.87–5.11)
RETIC CT PCT: 0.6 % (ref 0.4–3.1)
Retic Count, Absolute: 31.1 10*3/uL (ref 19.0–186.0)

## 2018-06-13 LAB — COMPREHENSIVE METABOLIC PANEL
ALBUMIN: 3.8 g/dL (ref 3.5–5.0)
ALT: 15 U/L (ref 0–44)
AST: 13 U/L — AB (ref 15–41)
Alkaline Phosphatase: 41 U/L (ref 38–126)
Anion gap: 9 (ref 5–15)
BUN: 25 mg/dL — AB (ref 8–23)
CO2: 19 mmol/L — ABNORMAL LOW (ref 22–32)
Calcium: 10.2 mg/dL (ref 8.9–10.3)
Chloride: 112 mmol/L — ABNORMAL HIGH (ref 98–111)
Creatinine, Ser: 2.15 mg/dL — ABNORMAL HIGH (ref 0.44–1.00)
GFR calc Af Amer: 24 mL/min — ABNORMAL LOW (ref >60)
GFR, EST NON AFRICAN AMERICAN: 21 mL/min — AB (ref >60)
Glucose, Bld: 107 mg/dL — ABNORMAL HIGH (ref 70–99)
POTASSIUM: 3.8 mmol/L (ref 3.5–5.1)
Sodium: 140 mmol/L (ref 135–145)
Total Bilirubin: 0.7 mg/dL (ref 0.3–1.2)
Total Protein: 6.6 g/dL (ref 6.5–8.1)

## 2018-06-13 LAB — IRON AND TIBC
Iron: 38 ug/dL (ref 28–170)
SATURATION RATIOS: 14 % (ref 10.4–31.8)
TIBC: 266 ug/dL (ref 250–450)
UIBC: 228 ug/dL

## 2018-06-13 LAB — CBC
HCT: 39 % (ref 36.0–46.0)
Hemoglobin: 11.5 g/dL — ABNORMAL LOW (ref 12.0–15.0)
MCH: 23.7 pg — ABNORMAL LOW (ref 26.0–34.0)
MCHC: 29.5 g/dL — ABNORMAL LOW (ref 30.0–36.0)
MCV: 80.4 fL (ref 78.0–100.0)
PLATELETS: 302 10*3/uL (ref 150–400)
RBC: 4.85 MIL/uL (ref 3.87–5.11)
RDW: 15.9 % — ABNORMAL HIGH (ref 11.5–15.5)
WBC: 4.9 10*3/uL (ref 4.0–10.5)

## 2018-06-13 LAB — PROTIME-INR
INR: 1.08
PROTHROMBIN TIME: 14 s (ref 11.4–15.2)

## 2018-06-13 LAB — I-STAT CHEM 8, ED
BUN: 28 mg/dL — ABNORMAL HIGH (ref 8–23)
Calcium, Ion: 1.34 mmol/L (ref 1.15–1.40)
Chloride: 111 mmol/L (ref 98–111)
Creatinine, Ser: 2.3 mg/dL — ABNORMAL HIGH (ref 0.44–1.00)
Glucose, Bld: 101 mg/dL — ABNORMAL HIGH (ref 70–99)
HEMATOCRIT: 35 % — AB (ref 36.0–46.0)
HEMOGLOBIN: 11.9 g/dL — AB (ref 12.0–15.0)
Potassium: 3.8 mmol/L (ref 3.5–5.1)
SODIUM: 144 mmol/L (ref 135–145)
TCO2: 19 mmol/L — AB (ref 22–32)

## 2018-06-13 LAB — DIFFERENTIAL
ABS IMMATURE GRANULOCYTES: 0 10*3/uL (ref 0.0–0.1)
BASOS PCT: 1 %
Basophils Absolute: 0 10*3/uL (ref 0.0–0.1)
EOS ABS: 0.1 10*3/uL (ref 0.0–0.7)
Eosinophils Relative: 2 %
Immature Granulocytes: 0 %
Lymphocytes Relative: 26 %
Lymphs Abs: 1.3 10*3/uL (ref 0.7–4.0)
Monocytes Absolute: 0.4 10*3/uL (ref 0.1–1.0)
Monocytes Relative: 8 %
Neutro Abs: 3.1 10*3/uL (ref 1.7–7.7)
Neutrophils Relative %: 63 %

## 2018-06-13 LAB — HEMOGLOBIN A1C
HEMOGLOBIN A1C: 5.7 % — AB (ref 4.8–5.6)
MEAN PLASMA GLUCOSE: 116.89 mg/dL

## 2018-06-13 LAB — TSH: TSH: 0.947 u[IU]/mL (ref 0.350–4.500)

## 2018-06-13 LAB — VITAMIN B12: VITAMIN B 12: 484 pg/mL (ref 180–914)

## 2018-06-13 LAB — GLUCOSE, CAPILLARY
GLUCOSE-CAPILLARY: 84 mg/dL (ref 70–99)
GLUCOSE-CAPILLARY: 82 mg/dL (ref 70–99)

## 2018-06-13 LAB — FOLATE: Folate: 16.8 ng/mL (ref >5.9)

## 2018-06-13 LAB — FERRITIN: FERRITIN: 125 ng/mL (ref 11–307)

## 2018-06-13 LAB — I-STAT TROPONIN, ED: Troponin i, poc: 0 ng/mL (ref 0.00–0.08)

## 2018-06-13 LAB — APTT: aPTT: 34 seconds (ref 24–36)

## 2018-06-13 MED ORDER — INSULIN ASPART 100 UNIT/ML ~~LOC~~ SOLN: 0.0000 [IU] | Freq: Three times a day (TID) | SUBCUTANEOUS | Status: DC

## 2018-06-13 MED ORDER — MECLIZINE HCL 25 MG PO TABS
25.0000 mg | ORAL_TABLET | Freq: Once | ORAL | Status: AC
  Administered 2018-06-13: 25 mg via ORAL
  Filled 2018-06-13: qty 1

## 2018-06-13 MED ORDER — FERROUS SULFATE 325 (65 FE) MG PO TABS
325.0000 mg | ORAL_TABLET | Freq: Every day | ORAL | Status: DC
  Administered 2018-06-14: 325 mg via ORAL
  Filled 2018-06-13: qty 1

## 2018-06-13 MED ORDER — CLONIDINE HCL 0.3 MG/24HR TD PTWK
0.3000 mg | MEDICATED_PATCH | TRANSDERMAL | Status: DC
  Administered 2018-06-13: 0.3 mg via TRANSDERMAL
  Filled 2018-06-13: qty 1

## 2018-06-13 MED ORDER — ACETAMINOPHEN 650 MG RE SUPP: 650.0000 mg | Freq: Four times a day (QID) | RECTAL | Status: DC | PRN

## 2018-06-13 MED ORDER — ACETAMINOPHEN 325 MG PO TABS: 650.0000 mg | ORAL_TABLET | Freq: Four times a day (QID) | ORAL | Status: DC | PRN

## 2018-06-13 MED ORDER — LOSARTAN POTASSIUM 50 MG PO TABS
100.0000 mg | ORAL_TABLET | Freq: Every day | ORAL | Status: DC
  Administered 2018-06-13 – 2018-06-14 (×2): 100 mg via ORAL
  Filled 2018-06-13 (×2): qty 2

## 2018-06-13 MED ORDER — AMLODIPINE BESYLATE 5 MG PO TABS
10.0000 mg | ORAL_TABLET | Freq: Every day | ORAL | Status: DC
  Administered 2018-06-13 – 2018-06-14 (×2): 10 mg via ORAL
  Filled 2018-06-13 (×2): qty 2

## 2018-06-13 MED ORDER — MECLIZINE HCL 25 MG PO TABS: 12.5000 mg | ORAL_TABLET | Freq: Three times a day (TID) | ORAL | Status: DC | PRN

## 2018-06-13 MED ORDER — ONDANSETRON HCL 4 MG/2ML IJ SOLN: 4.0000 mg | Freq: Four times a day (QID) | INTRAMUSCULAR | Status: DC | PRN

## 2018-06-13 MED ORDER — ATORVASTATIN CALCIUM 20 MG PO TABS
10.0000 mg | ORAL_TABLET | Freq: Every day | ORAL | Status: DC
  Administered 2018-06-13: 10 mg via ORAL
  Filled 2018-06-13: qty 1

## 2018-06-13 MED ORDER — INSULIN ASPART 100 UNIT/ML ~~LOC~~ SOLN: 0.0000 [IU] | Freq: Every day | SUBCUTANEOUS | Status: DC

## 2018-06-13 MED ORDER — ONDANSETRON HCL 4 MG PO TABS: 4.0000 mg | ORAL_TABLET | Freq: Four times a day (QID) | ORAL | Status: DC | PRN

## 2018-06-13 MED ORDER — ENOXAPARIN SODIUM 30 MG/0.3ML ~~LOC~~ SOLN
30.0000 mg | SUBCUTANEOUS | Status: DC
  Administered 2018-06-13: 30 mg via SUBCUTANEOUS
  Filled 2018-06-13: qty 0.3

## 2018-06-13 MED ORDER — SODIUM CHLORIDE 0.9% FLUSH
3.0000 mL | Freq: Two times a day (BID) | INTRAVENOUS | Status: DC
  Administered 2018-06-13 – 2018-06-14 (×2): 3 mL via INTRAVENOUS

## 2018-06-13 NOTE — H&P (Signed)
History and Physical    Megan Stevenson GNF:[id number] DOB: 1941/07/02 DOA: 06/13/2018  **Will place patient in observation status based on the expectation that the patient will need hospitalization/ hospital care for less than or equal to 24 hours  PCP: Velna Hatchet, MD   Attending physician: Roel Cluck  Patient coming from/Resides with: Private residence  Chief Complaint: Gait instability with dizziness and vertigo symptoms  HPI: Megan Stevenson is a 77 y.o. 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  Patient presented to the ER today after noticing several days of ataxia with associated dizziness and intermittent vertigo symptoms.  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  MRI revealed no evidence of acute stroke.  Patient was noted to be bradycardic during her stay with rates as low as 44 bpm.  She is on carvedilol.  Patient does have some short-term memory deficits and additional history obtained by family at bedside.  Patient has been having similar issues for several months and was actually evaluated by PCP 2 months ago and was started on meclizine without any improvement in her symptoms.  Patient reports that she typically notices the symptoms when she gets up too fast and if she takes her time getting up or stands still/ sits down the symptoms resolved.  ED Course: Vital Signs: BP (!) 153/95   Pulse (!) 52   Temp 98.2 F (36.8 C)   Resp 13   Ht 5\' 9"  (1.753 m)   Wt 68 kg (150 lb)   SpO2 99%   BMI 22.15 kg/m  CT head/MRI brain: As above Lab data: BUN 140, potassium 3.8, chloride 112, CO2 19, BUN 25, creatinine 2.15, glucose 107, calcium 10.2, anion gap 9, LFTs normal, troponin normal, white count 4900 with normal differential, hemoglobin 11.5 with normal MCV,  platelets 302,006 normal. Medications and treatments:  meclizine 25 mg x 1  Review of Systems:  In addition to the HPI above,  No Fever-chills, myalgias or other constitutional symptoms No Headache, changes with Vision or hearing, new focal weakness weakness, tingling, numbness in any extremity, dysarthria or word finding difficulty, , tremors or seizure activity No problems swallowing food or Liquids, indigestion/reflux, choking or coughing while eating, abdominal pain with or after eating No Chest pain, Cough or Shortness of Breath, palpitations, orthopnea or DOE No Abdominal pain, N/V, melena,hematochezia, dark tarry stools, constipation No dysuria, malodorous urine, hematuria or flank pain No new skin rashes, lesions, masses or bruises, No new joint pains, aches, swelling or redness No recent unintentional weight gain or loss No polyuria, polydypsia or polyphagia   Past Medical History:  Diagnosis Date  . Anemia   . Arthritis   . Diabetes mellitus without complication (HCC)    no medications  . Dysrhythmia   . Hypertension   . Macular hole    OD  . Renal disorder    Sees someone [facility]  . Wears glasses     Past Surgical History:  Procedure Laterality Date  . [place] VITRECTOMY WITH 20 GAUGE MVR PORT FOR MACULAR HOLE Right 06/14/2014   Procedure: 25 GAUGE PARS PLANA VITRECTOMY WITH 20 GAUGE MVR PORT FOR MACULAR HOLE; serum patch; gas injection; laser treament; C3F8 gas fluid exchange.;  Surgeon: Hayden Pedro, MD;  Location: [facility];  Service: Ophthalmology;  Laterality: Right;  .  ABDOMINAL HYSTERECTOMY    . EYE SURGERY    . GANGLION CYST EXCISION Left 02/02/2015   Procedure: EXCISION OF VOLAR GANGLION CYST LEFT WRIST;  Surgeon: Leanora Cover, MD;  Location: [facility];  Service: Orthopedics;  Laterality: Left;  [place] GAS INSERTION Right 07/19/2014   Procedure: INSERTION OF GAS;  Surgeon: Hayden Pedro, MD;  Location: [facility];  Service:  Ophthalmology;  Laterality: Right;  C3F8  . LASER PHOTO ABLATION Right 07/19/2014   Procedure: LASER PHOTO ABLATION;  Surgeon: Hayden Pedro, MD;  Location: [facility];  Service: Ophthalmology;  Laterality: Right;  . PARS PLANA VITRECTOMY W/ REPAIR OF MACULAR HOLE Right 06/14/2014   DR MATTHEWS  . SCLERAL BUCKLE Right 07/19/2014   Procedure: SCLERAL BUCKLE;  Surgeon: Hayden Pedro, MD;  Location: [facility];  Service: Ophthalmology;  Laterality: Right;    Social History   Socioeconomic History  . Marital status: Divorced    Spouse name: Not on file  . Number of children: Not on file  . Years of education: Not on file  . Highest education level: Not on file  Occupational History  . Not on file  Social Needs  . Financial resource strain: Not on file  . Food insecurity:    Worry: Not on file    Inability: Not on file  . Transportation needs:    Medical: Not on file    Non-medical: Not on file  Tobacco Use  . Smoking status: Former Smoker    Packs/day: 0.25    Years: 3.00    Pack years: 0.75    Types: Cigarettes    Last attempt to quit: 12/09/1993    Years since quitting: 24.5  . Smokeless tobacco: Never Used  Substance and Sexual Activity  . Alcohol use: No  . Drug use: No  . Sexual activity: Not on file  Lifestyle  . Physical activity:    Days per week: Not on file    Minutes per session: Not on file  . Stress: Not on file  Relationships  . Social connections:    Talks on phone: Not on file    Gets together: Not on file    Attends religious service: Not on file    Active member of club or organization: Not on file    Attends meetings of clubs or organizations: Not on file    Relationship status: Not on file  . Intimate partner violence:    Fear of current or ex partner: Not on file    Emotionally abused: Not on file    Physically abused: Not on file    Forced sexual activity: Not on file  Other Topics Concern  . Not on file  Social History Narrative  . Not on file     Mobility: Independent Work history: Not obtained   Allergies  Allergen Reactions  . Nsaids Other (See Comments)    REACTION: Has chronic renal disease  . Benazepril Hcl Other (See Comments)    REACTION: Dizziness  . Furosemide Other (See Comments)    REACTION: Hypernatremia  . Atenolol Other (See Comments)    REACTION: Headaches    Family history reviewed and not pertinent to current admission findings or diagnosis  Prior to Admission medications   Medication Sig Start Date End Date Taking? Authorizing Provider  amLODipine (NORVASC) 10 MG tablet Take 10 mg by mouth daily.   Yes [provider]  atorvastatin (LIPITOR) 10 MG tablet Take 10 mg by mouth at  bedtime. 11/07/17  Yes [provider]  carvedilol (COREG) 25 MG tablet Take 25 mg by mouth daily.    Yes [provider]  cloNIDine (CATAPRES - DOSED IN MG/24 HR) 0.3 mg/24hr patch Place 0.3 mg onto the skin once a week. Friday   Yes [provider]  ferrous sulfate 325 (65 FE) MG tablet Take 325 mg by mouth daily with breakfast.   Yes [provider]  losartan (COZAAR) 100 MG tablet Take 100 mg by mouth daily.   Yes [provider]  HYDROcodone-acetaminophen (NORCO) 5-325 MG per tablet 1-2 tabs po q6 hours prn pain Patient not taking: Reported on 11/11/2017 02/02/15   Leanora Cover, MD  meclizine (ANTIVERT) 25 MG tablet Take 1 tablet (25 mg total) by mouth 3 (three) times daily as needed for dizziness. 12/04/13   Francine Graven, DO    Physical Exam: Vitals:   06/13/18 1330 06/13/18 1345 06/13/18 1523 06/13/18 1530  BP: (!) 121/97 (!) 160/67 (!) 166/68 (!) 153/95  Pulse: 100 (!) 52 (!) 56 (!) 52  Resp: 19 13 16 13   Temp:      TempSrc:      SpO2: 98% 99% 100% 99%  Weight:      Height:          Constitutional: NAD, calm, comfortable Eyes: PERRL, lids and conjunctivae normal ENMT: Mucous membranes are moist. Posterior pharynx clear of any exudate or lesions.Normal  dentition.  Neck: normal, supple, no masses, no thyromegaly Respiratory: clear to auscultation bilaterally, no wheezing, no crackles. Normal respiratory effort. No accessory muscle use.  Cardiovascular: Regular rate and rhythm, grade 2/6 systolic murmur RSB adjacent to sternum at second intercostal space; no rubs / gallops. No extremity edema. 2+ pedal pulses. No carotid bruits.  Abdomen: no tenderness, no masses palpated. No hepatosplenomegaly. Bowel sounds positive.  Musculoskeletal: no clubbing / cyanosis. No joint deformity upper and lower extremities. Good ROM, no contractures. Normal muscle tone.  Skin: no rashes, lesions, ulcers. No induration Neurologic: CN 2-12 grossly intact for documented horizontal nystagmus when testing the left both eyes. Sensation intact, DTR normal. Strength 5/5 x all 4 extremities.  Psychiatric: Normal judgment and insight. Alert and oriented x 3. Normal mood.    Labs on Admission: I have personally reviewed following labs and imaging studies  CBC: Recent Labs  Lab 06/13/18 1202 06/13/18 1208  WBC 4.9  --   NEUTROABS 3.1  --   HGB 11.5* 11.9*  HCT 39.0 35.0*  MCV 80.4  --   PLT 302  --    Basic Metabolic Panel: Recent Labs  Lab 06/13/18 1202 06/13/18 1208  NA 140 144  K 3.8 3.8  CL 112* 111  CO2 19*  --   GLUCOSE 107* 101*  BUN 25* 28*  CREATININE 2.15* 2.30*  CALCIUM 10.2  --    GFR: Estimated Creatinine Clearance: 21.7 mL/min (A) (by C-G formula based on SCr of 2.3 mg/dL (H)). Liver Function Tests: Recent Labs  Lab 06/13/18 1202  AST 13*  ALT 15  ALKPHOS 41  BILITOT 0.7  PROT 6.6  ALBUMIN 3.8   No results for input(s): LIPASE, AMYLASE in the last 168 hours. No results for input(s): AMMONIA in the last 168 hours. Coagulation Profile: Recent Labs  Lab 06/13/18 1202  INR 1.08   Cardiac Enzymes: No results for input(s): CKTOTAL, CKMB, CKMBINDEX, TROPONINI in the last 168 hours. BNP (last 3 results) No results for input(s):  PROBNP in the last 8760 hours. HbA1C:  No results for input(s): HGBA1C in the last 72 hours. CBG: No results for input(s): GLUCAP in the last 168 hours. Lipid Profile: No results for input(s): CHOL, HDL, LDLCALC, TRIG, CHOLHDL, LDLDIRECT in the last 72 hours. Thyroid Function Tests: No results for input(s): TSH, T4TOTAL, FREET4, T3FREE, THYROIDAB in the last 72 hours. Anemia Panel: No results for input(s): VITAMINB12, FOLATE, FERRITIN, TIBC, IRON, RETICCTPCT in the last 72 hours. Urine analysis:    Component Value Date/Time   COLORURINE YELLOW 12/04/2013 1315   APPEARANCEUR CLOUDY (A) 12/04/2013 1315   LABSPEC 1.012 12/04/2013 1315   PHURINE 5.0 12/04/2013 1315   GLUCOSEU NEGATIVE 12/04/2013 1315   HGBUR MODERATE (A) 12/04/2013 1315   BILIRUBINUR NEGATIVE 12/04/2013 1315   KETONESUR NEGATIVE 12/04/2013 1315   PROTEINUR 30 (A) 12/04/2013 1315   UROBILINOGEN 0.2 12/04/2013 1315   NITRITE NEGATIVE 12/04/2013 1315   LEUKOCYTESUR TRACE (A) 12/04/2013 1315   Sepsis Labs: @LABRCNTIP (procalcitonin:4,lacticidven:4) )No results found for this or any previous visit (from the past 240 hour(s)).   Radiological Exams on Admission: Ct Head Wo Contrast  Result Date: 06/13/2018 CLINICAL DATA:  Dizziness and gait disturbance EXAM: CT HEAD WITHOUT CONTRAST TECHNIQUE: Contiguous axial images were obtained from the base of the skull through the vertex without intravenous contrast. COMPARISON:  December 04, 2013 FINDINGS: Brain: Ventricles and sulci are within normal limits for age. There is no intracranial mass, hemorrhage, extra-axial fluid collection, or midline shift. There is patchy small vessel disease in the centra semiovale bilaterally. There is evidence of decreased attenuation in the anterior limb of the right internal capsule, a finding present previously and felt to represent a prior small infarct in this area. There is decreased attenuation focally in the superior posterior left centrum  semiovale, also present on previous study and felt to represent a prior small infarct in the white matter in this area. No acute appearing infarct is evident on this study. Vascular: There is no appreciable hyperdense vessel. There is calcification in each carotid siphon region. Skull: The bony calvarium appears intact. 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 Sinuses/Orbits: There is mucosal thickening in multiple ethmoid air cells. There is mild mucosal thickening along each medial maxillary antrum. Frontal sinuses are essentially aplastic. No intraorbital lesions are evident. There is evidence of previous scleral banding on the right. Other: Mastoid air cells are clear. 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 No acute infarct is demonstrable on this current examination. No mass or hemorrhage. There is arterial vascular calcification. There are areas of paranasal sinus disease. Previous scleral banding noted on the right. Electronically Signed   By: Lowella Grip III M.D.   On: 06/13/2018 13:11   Mr Brain Wo Contrast  Result Date: 06/13/2018 CLINICAL DATA:  Dizzy.  Difficulty walking, leaning to the side. EXAM: MRI HEAD WITHOUT CONTRAST TECHNIQUE: Multiplanar, multiecho pulse sequences of the brain and surrounding structures were obtained without intravenous contrast. COMPARISON:  None. FINDINGS: Brain: No evidence for acute infarction, hemorrhage, mass lesion, hydrocephalus, or extra-axial fluid. Mild cerebral and cerebellar atrophy. Moderately advanced T2 and FLAIR hyperintensities in the white matter, likely small vessel disease. Small deep white matter and basal ganglia lacunar infarcts, as predicted from CT, without findings of acute ischemia. Vascular: Flow voids are maintained. Skull and upper cervical spine: Partial empty sella. Normal marrow signal. Mild  tonsillar ectopia of 5 mm. Pilar Plate Chiari I malformation  is not established. No hydromyelia. Sinuses/Orbits: No layering sinus fluid. BILATERAL cataract extraction. Other: None. 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 No acute intracranial findings. Mild tonsillar ectopia, without Chiari I malformation. This is unlikely to be contributory to the patient's symptoms. Partial empty sella, doubtful significance. Electronically Signed   By: Staci Righter M.D.   On: 06/13/2018 15:25    EKG: (Independently reviewed) sinus bradycardia with ventricular rate 48 bpm, QTC 194 ms, early R wave rotation with low voltage QRS in V3, nonspecific ST changes without any acute ST segment changes concerning for ischemia  Assessment/Plan Principal Problem:   Ataxia -Presents with greater than 2 months of gait instability primarily with positional changes and activity intermittently associated with vertiginous symptoms with left side nystagmus on testing with imaging negative for acute stroke -Differential includes BPPV versus orthostasis -Patient also with significant bradycardia of sinus etiology with resting heart rates in the 40s and 50s noting in 2014 resting heart rate was in the 60s-suspect bradycardia primarily related to carvedilol which will be held for now -Echocardiogram -Continue telemetry -Check orthostatic vital signs -Mobilize with assistance -PT vestibular evaluation -PT/OT evaluation -Meclizine prn  Active Problems:   Bradycardia, sinus -Likely related to desired side effects from carvedilol which currently is on hold -May be contributing to above ataxic symptoms -Obtain TSH    Hypertension -Current blood pressure controlled -Labetalol on hold as above -Continue Cozaar and clonidine -If orthostatic may also need to adjust clonidine dosage or seek alternative medication since this is an alpha-blocker and can also contribute to orthostasis    CKD (chronic kidney  disease) stage 4, GFR 15-29 ml/min  -Current renal function: BUN 28 creatinine 2.3 -Last known renal function available in 2016: BUN 24 creatinine 1.58 -GFR has changed from 36 to 24 and likely reflective of progression and chronic kidney disease -Obtain urinalysis    Heart murmur -Patient reports has never been told she has a heart murmur -Follow-up on echo as above    Diet-controlled diabetes mellitus  -Patient reports she is no longer diabetic has been off medications and treatments for multiple years -Obtain hemoglobin A1c and follow CBGs during acute illness    Anemia -Anemia panel -Continue oral iron supplementation    **Additional lab, imaging and/or diagnostic evaluation at discretion of supervising physician  DVT prophylaxis: Lovenox dose adjusted for low GFR Code Status: Full Family Communication: Family at bedside Disposition Plan: Home Consults called: None    ELLIS,ALLISON L. ANP-BC [facility] Pager [phone]   If 7PM-7AM, please contact night-coverage [email or web address] Password [facility]  06/13/2018, 4:37 PM

## 2018-06-13 NOTE — ED Notes (Signed)
Pt states "When I got up I was trying to walk and I was walking sideways; I was doing it yesterday and decided i'd better come on to the hospital" When asked what time the symptoms started, pt stated "yesterday, when I go out of bed around 9:30"

## 2018-06-13 NOTE — ED Notes (Signed)
Patient transported to CT 

## 2018-06-13 NOTE — ED Notes (Signed)
Pt returned from MRI °

## 2018-06-13 NOTE — ED Notes (Signed)
Patient transported to MRI 

## 2018-06-13 NOTE — ED Triage Notes (Signed)
Pt woke up this morning and was normal, went back to bed and around 10am started feeling dizzy, got up and was having difficulty walking, leaning to side, having to hold on to something, sat down in chair beside bed.

## 2018-06-13 NOTE — ED Provider Notes (Signed)
[facility] EMERGENCY DEPARTMENT Provider Note   CSN: [id number] Arrival date & time: 06/13/18  1141     History   Chief Complaint Chief Complaint  Patient presents with  . Dizziness    HPI Megan Stevenson is a 77 y.o. female.  Patient presents with complaint of dizziness.  She has a history of hypertension, high cholesterol, and diabetes.  Also chronic kidney disease.  She has never had a previous stroke.  Starting yesterday, she felt like she was "walking sideways", as earlier as yesterday morning.  She denies focal weakness on either side of her body.  She denies vision changes, new loss of vision or double vision.  Patient states that she had the symptoms when she went to bed last night.  They persisted when she woke today at approximately 9 AM.  She states that she called her sister who then brought her to the hospital.  She has not had symptoms like this in the past.  No headache, nausea or vomiting.  She did not hit her head.  Denies any new medications.  Denies syncope or lightheadedness.  No sensation changes. The onset of this condition was acute. The course is constant. Aggravating factors: none. Alleviating factors: none.       Past Medical History:  Diagnosis Date  . Anemia   . Arthritis   . Diabetes mellitus without complication (HCC)    no medications  . Dysrhythmia   . Hypertension   . Macular hole    OD  . Renal disorder    Sees someone [facility]  . Wears glasses     Patient Active Problem List   Diagnosis Date Noted  . 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 DIABETES MELLITUS, TYPE II 07/24/2007  . DYSLIPIDEMIA 07/24/2007  . 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  . ANEMIA-IRON DEFICIENCY 07/24/2007  . HYPERTENSION 07/24/2007  . PUD 07/24/2007  . 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  RENAL INSUFFICIENCY 07/24/2007  . ROTATOR CUFF SYNDROME, 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  . SUBACROMIAL BURSITIS, 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  . HEART MURMUR, HX OF 07/24/2007  . COLONIC POLYPS, HX OF 07/24/2007    Past Surgical History:  Procedure Laterality Date  . [place] VITRECTOMY WITH 20 GAUGE MVR PORT FOR MACULAR HOLE Right 06/14/2014   Procedure: 25 GAUGE [place] VITRECTOMY WITH 20 GAUGE MVR PORT FOR MACULAR HOLE; serum patch; gas injection; laser treament; C3F8 gas fluid exchange.;  Surgeon: Hayden Pedro, MD;  Location: [facility];  Service: Ophthalmology;  Laterality: Right;  . ABDOMINAL HYSTERECTOMY    . EYE SURGERY    . GANGLION CYST EXCISION Left 02/02/2015   Procedure: EXCISION OF VOLAR GANGLION CYST LEFT WRIST;  Surgeon: Leanora Cover, MD;  Location: [facility];  Service: Orthopedics;  Laterality: Left;  [place] GAS INSERTION Right 07/19/2014   Procedure: INSERTION OF GAS;  Surgeon: Hayden Pedro, MD;  Location: [facility];  Service: Ophthalmology;  Laterality: Right;  C3F8  . LASER PHOTO ABLATION Right 07/19/2014   Procedure: LASER PHOTO ABLATION;  Surgeon: Hayden Pedro, MD;  Location: [place] [facility];  Service: Ophthalmology;  Laterality: Right;  . [place] VITRECTOMY W/ REPAIR OF MACULAR HOLE Right 06/14/2014   DR MATTHEWS  . SCLERAL BUCKLE Right 07/19/2014   Procedure: SCLERAL BUCKLE;  Surgeon: Hayden Pedro, MD;  Location: [facility];  Service: Ophthalmology;  Laterality: Right;     OB History   None      Home Medications    Prior to Admission medications   Medication Sig Start Date End Date Taking? Authorizing Provider  amLODipine (NORVASC) 10 MG tablet Take 10 mg by mouth daily.   Yes [provider]  atorvastatin (LIPITOR) 10 MG tablet Take 10 mg by mouth at bedtime. 11/07/17  Yes [provider]  carvedilol (COREG) 25 MG tablet Take 25 mg by mouth daily.    Yes [provider]  cloNIDine (CATAPRES - DOSED IN MG/24 HR) 0.3 mg/24hr patch Place 0.3 mg  onto the skin once a week. Friday   Yes [provider]  ferrous sulfate 325 (65 FE) MG tablet Take 325 mg by mouth daily with breakfast.   Yes [provider]  losartan (COZAAR) 100 MG tablet Take 100 mg by mouth daily.   Yes [provider]  HYDROcodone-acetaminophen (NORCO) 5-325 MG per tablet 1-2 tabs po q6 hours prn pain Patient not taking: Reported on 11/11/2017 02/02/15   Leanora Cover, MD  meclizine (ANTIVERT) 25 MG tablet Take 1 tablet (25 mg total) by mouth 3 (three) times daily as needed for dizziness. 12/04/13   Francine Graven, DO    Family History History reviewed. No pertinent family history.  Social History Social History   Tobacco Use  . Smoking status: Former Smoker    Packs/day: 0.25    Years: 3.00    Pack years: 0.75    Types: Cigarettes    Last attempt to quit: 12/09/1993    Years since quitting: 24.5  . Smokeless tobacco: Never Used  Substance Use Topics  . Alcohol use: No  . Drug use: No     Allergies   Nsaids; Benazepril hcl; Furosemide; and Atenolol   Review of Systems Review of Systems  Constitutional: Negative for fever.  HENT: Negative for congestion, dental problem, rhinorrhea and sinus pressure.   Eyes: Negative for photophobia, discharge, redness and visual disturbance.  Respiratory: Negative for shortness of breath.   Cardiovascular: Negative for chest pain.  Gastrointestinal: Negative for nausea and vomiting.  Musculoskeletal: Positive for gait problem. Negative for neck pain and neck stiffness.  Skin: Negative for rash.  Neurological: Positive for dizziness. Negative for syncope, speech difficulty, weakness, light-headedness, numbness and headaches.  Psychiatric/Behavioral: Negative for confusion.     Physical Exam Updated Vital Signs BP (!) 166/68 (BP Location: Left Arm)   Pulse (!) 56   Temp 98.2 F (36.8 C)   Resp 16   Ht 5\' 9"  (1.753 m)   Wt 68 kg (150 lb)   SpO2 100%   BMI 22.15 kg/m   Physical  Exam  Constitutional: She is oriented to person, place, and time. She appears well-developed and well-nourished.  HENT:  Head: Normocephalic and atraumatic.  Right Ear: Tympanic membrane, external ear and ear canal normal.  Left Ear: Tympanic membrane, external ear and ear canal normal.  Nose: Nose normal.  Mouth/Throat: Uvula is midline, oropharynx is clear and moist and mucous membranes are normal.  Eyes: Pupils are equal, round, and reactive to light. Conjunctivae, EOM and lids are normal. Right eye exhibits no nystagmus. Left eye exhibits no nystagmus.  + horizontal nystagmus  Neck: Normal range of motion. Neck supple.  Cardiovascular: Normal rate and regular rhythm.  Pulmonary/Chest: Effort normal and breath sounds normal.  Abdominal: Soft. There is no tenderness.  Musculoskeletal:       Cervical back: She  exhibits normal range of motion, no tenderness and no bony tenderness.  Neurological: She is alert and oriented to person, place, and time. She has normal strength and normal reflexes. No cranial nerve deficit or sensory deficit. She displays a negative Romberg sign. Coordination and gait abnormal. GCS eye subscore is 4. GCS verbal subscore is 5. GCS motor subscore is 6.  Patient is grossly ataxic with walking requires assistance.  She holds onto items to help her ambulate.  She also has difficulty sitting straight up in bed during finger-to-nose testing.  She has obvious dysmetria, seemingly worse with right upper extremity.  Skin: Skin is warm and dry.  Psychiatric: She has a normal mood and affect.  Nursing note and vitals reviewed.    ED Treatments / Results  Labs (all labs ordered are listed, but only abnormal results are displayed) Labs Reviewed  CBC - Abnormal; Notable for the following components:      Result Value   Hemoglobin 11.5 (*)    MCH 23.7 (*)    MCHC 29.5 (*)    RDW 15.9 (*)    All other components within normal limits  COMPREHENSIVE METABOLIC PANEL -  Abnormal; Notable for the following components:   Chloride 112 (*)    CO2 19 (*)    Glucose, Bld 107 (*)    BUN 25 (*)    Creatinine, Ser 2.15 (*)    AST 13 (*)    GFR calc non Af Amer 21 (*)    GFR calc Af Amer 24 (*)    All other components within normal limits  I-STAT CHEM 8, ED - Abnormal; Notable for the following components:   BUN 28 (*)    Creatinine, Ser 2.30 (*)    Glucose, Bld 101 (*)    TCO2 19 (*)    Hemoglobin 11.9 (*)    HCT 35.0 (*)    All other components within normal limits  URINE CULTURE  PROTIME-INR  APTT  DIFFERENTIAL  URINALYSIS, ROUTINE W REFLEX MICROSCOPIC  I-STAT TROPONIN, ED  CBG MONITORING, ED    EKG EKG Interpretation  Date/Time:  Saturday June 13 2018 11:55:48 EDT Ventricular Rate:  48 PR Interval:  158 QRS Duration: 102 QT Interval:  442 QTC Calculation: 394 R Axis:   36 Text Interpretation:  Marked sinus bradycardia Nonspecific T wave abnormality Abnormal ECG Confirmed by Dene Gentry [phone]) on 06/13/2018 12:02:14 PM   Radiology Ct Head Wo Contrast  Result Date: 06/13/2018 CLINICAL DATA:  Dizziness and gait disturbance EXAM: CT HEAD WITHOUT CONTRAST TECHNIQUE: Contiguous axial images were obtained from the base of the skull through the vertex without intravenous contrast. COMPARISON:  December 04, 2013 FINDINGS: Brain: Ventricles and sulci are within normal limits for age. There is no intracranial mass, hemorrhage, extra-axial fluid collection, or midline shift. There is patchy small vessel disease in the centra semiovale bilaterally. There is evidence of decreased attenuation in the anterior limb of the right internal capsule, a finding present previously and felt to represent a prior small infarct in this area. There is decreased attenuation focally in the superior posterior left centrum semiovale, also present on previous study and felt to represent a prior small infarct in the white matter in this area. No acute appearing infarct is  evident on this study. Vascular: There is no appreciable hyperdense vessel. There is calcification in each carotid siphon region. Skull: The bony calvarium appears intact. 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 Sinuses/Orbits: There is mucosal thickening in multiple  ethmoid air cells. There is mild mucosal thickening along each medial maxillary antrum. Frontal sinuses are essentially aplastic. No intraorbital lesions are evident. There is evidence of previous scleral banding on the right. Other: Mastoid air cells are clear. 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 No acute infarct is demonstrable on this current examination. No mass or hemorrhage. There is arterial vascular calcification. There are areas of paranasal sinus disease. Previous scleral banding noted on the right. Electronically Signed   By: Lowella Grip III M.D.   On: 06/13/2018 13:11   Mr Brain Wo Contrast  Result Date: 06/13/2018 CLINICAL DATA:  Dizzy.  Difficulty walking, leaning to the side. EXAM: MRI HEAD WITHOUT CONTRAST TECHNIQUE: Multiplanar, multiecho pulse sequences of the brain and surrounding structures were obtained without intravenous contrast. COMPARISON:  None. FINDINGS: Brain: No evidence for acute infarction, hemorrhage, mass lesion, hydrocephalus, or extra-axial fluid. Mild cerebral and cerebellar atrophy. Moderately advanced T2 and FLAIR hyperintensities in the white matter, likely small vessel disease. Small deep white matter and basal ganglia lacunar infarcts, as predicted from CT, without findings of acute ischemia. Vascular: Flow voids are maintained. Skull and upper cervical spine: Partial empty sella. Normal marrow signal. Mild tonsillar ectopia of 5 mm. Pilar Plate Chiari I malformation is not established. No hydromyelia. Sinuses/Orbits: No layering sinus fluid. BILATERAL cataract  extraction. Other: None. 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 No acute intracranial findings. Mild tonsillar ectopia, without Chiari I malformation. This is unlikely to be contributory to the patient's symptoms. Partial empty sella, doubtful significance. Electronically Signed   By: Staci Righter M.D.   On: 06/13/2018 15:25    Procedures Procedures (including critical care time)  Medications Ordered in ED Medications  meclizine (ANTIVERT) tablet 25 mg (25 mg Oral Given 06/13/18 1608)     Initial Impression / Assessment and Plan / ED Course  I have reviewed the triage vital signs and the nursing notes.  Pertinent labs & imaging results that were available during my care of the patient were reviewed by me and considered in my medical decision making (see chart for details).     Patient seen and examined. My history timeline is much different than what was documented by triage nurse (see history). Discussed with Dr. Francia Greaves.   Vital signs reviewed and are as follows: BP (!) 166/68 (BP Location: Left Arm)   Pulse (!) 56   Temp 98.2 F (36.8 C)   Resp 16   Ht 5\' 9"  (1.753 m)   Wt 68 kg (150 lb)   SpO2 100%   BMI 22.15 kg/m   Current RN corroborated history, symptoms started yesterday morning, not abruptly this morning. This will not be a code stroke.   CT showed possible previous strokes. MRI ordered to evaluate for cerebellar stroke.   3:59 PM MRI is negative for acute stroke.   Patient seen and examined with Dr. Francia Greaves. Patient continues to be very off balance with walking. After discussion, feel it would be unsafe to discharge to home with degree of symptoms. Her husband at home would not be able to help patient if she fell. Will give meclizine.  Of note, patient's sister gives much more history regarding patient's symptoms. State that they have been going on for sometime -- she has been treated with meclizine. Patient admits this has not helped  much.   4:20 PM Spoke with Ebony Hail  Lissa Merlin NP with [facility] who will see patient.    Final Clinical Impressions(s) / ED Diagnoses   Final diagnoses:  Ataxia  Bradycardia  Chronic kidney disease, unspecified CKD stage   Admit for ataxia, weakness.   ED Discharge Orders    None       Carlisle Cater, Hershal Coria 06/13/18 1621    Valarie Merino, MD 06/14/18 [phone]

## 2018-06-14 ENCOUNTER — Observation Stay (HOSPITAL_BASED_OUTPATIENT_CLINIC_OR_DEPARTMENT_OTHER): Payer: Medicare Other

## 2018-06-14 DIAGNOSIS — R001 Bradycardia, unspecified: Secondary | ICD-10-CM

## 2018-06-14 DIAGNOSIS — I5030 Unspecified diastolic (congestive) heart failure: Secondary | ICD-10-CM

## 2018-06-14 DIAGNOSIS — R270 Ataxia, unspecified: Secondary | ICD-10-CM | POA: Diagnosis not present

## 2018-06-14 LAB — BASIC METABOLIC PANEL
ANION GAP: 4 — AB (ref 5–15)
BUN: 29 mg/dL — ABNORMAL HIGH (ref 8–23)
CO2: 24 mmol/L (ref 22–32)
CREATININE: 2.04 mg/dL — AB (ref 0.44–1.00)
Calcium: 10.1 mg/dL (ref 8.9–10.3)
Chloride: 116 mmol/L — ABNORMAL HIGH (ref 98–111)
GFR calc non Af Amer: 23 mL/min — ABNORMAL LOW (ref >60)
GFR, EST AFRICAN AMERICAN: 26 mL/min — AB (ref >60)
Glucose, Bld: 91 mg/dL (ref 70–99)
Potassium: 3.9 mmol/L (ref 3.5–5.1)
Sodium: 144 mmol/L (ref 135–145)

## 2018-06-14 LAB — GLUCOSE, CAPILLARY
GLUCOSE-CAPILLARY: 72 mg/dL (ref 70–99)
GLUCOSE-CAPILLARY: 84 mg/dL (ref 70–99)
Glucose-Capillary: 111 mg/dL — ABNORMAL HIGH (ref 70–99)

## 2018-06-14 LAB — ECHOCARDIOGRAM COMPLETE
Height: 69 in
WEIGHTICAEL: 2366.86 [oz_av]

## 2018-06-14 MED ORDER — CLONIDINE HCL 0.2 MG/24HR TD PTWK: 0.2000 mg | MEDICATED_PATCH | TRANSDERMAL | 1 refills | Status: DC

## 2018-06-14 MED ORDER — HYDROCHLOROTHIAZIDE 25 MG PO TABS: 25.0000 mg | ORAL_TABLET | Freq: Every day | ORAL | 0 refills | Status: DC

## 2018-06-14 MED ORDER — HYDROCHLOROTHIAZIDE 25 MG PO TABS
25.0000 mg | ORAL_TABLET | Freq: Every day | ORAL | Status: DC
  Administered 2018-06-14: 25 mg via ORAL
  Filled 2018-06-14: qty 1

## 2018-06-14 NOTE — Progress Notes (Signed)
  Echocardiogram 2D Echocardiogram has been performed.  Megan Stevenson 06/14/2018, 1:00 PM

## 2018-06-14 NOTE — Progress Notes (Signed)
Patient was discharged home with home health by MD order; discharged instructions review and give to patient with care notes and prescriptions; IV DIC; skin intact; rolling walker was brought to the patient's room; patient will be escorted to the car by nurse tech via wheelchair.

## 2018-06-14 NOTE — Discharge Instructions (Signed)
Follow with Primary MD Megan Hatchet, MD in 7 days   Get CBC, CMP, 2 view Chest X ray checked  by Primary MD next visit.    Activity: As tolerated with Full fall precautions use walker/cane & assistance as needed   Disposition Home    Diet: Heart Healthy, carbohydrate modified, with feeding assistance and aspiration precautions.  For Heart failure patients - Check your Weight same time everyday, if you gain over 2 pounds, or you develop in leg swelling, experience more shortness of breath or chest pain, call your Primary MD immediately. Follow Cardiac Low Salt Diet and 1.5 lit/day fluid restriction.   On your next visit with your primary care physician please Get Medicines reviewed and adjusted.   Please request your Prim.MD to go over all Hospital Tests and Procedure/Radiological results at the follow up, please get all Hospital records sent to your Prim MD by signing hospital release before you go home.   If you experience worsening of your admission symptoms, develop shortness of breath, life threatening emergency, suicidal or homicidal thoughts you must seek medical attention immediately by calling 911 or calling your MD immediately  if symptoms less severe.  You Must read complete instructions/literature along with all the possible adverse reactions/side effects for all the Medicines you take and that have been prescribed to you. Take any new Medicines after you have completely understood and accpet all the possible adverse reactions/side effects.   Do not drive, operating heavy machinery, perform activities at heights, swimming or participation in water activities or provide baby sitting services if your were admitted for syncope or siezures until you have seen by Primary MD or a Neurologist and advised to do so again.  Do not drive when taking Pain medications.    Do not take more than prescribed Pain, Sleep and Anxiety Medications  Special Instructions: If you have smoked  or chewed Tobacco  in the last 2 yrs please stop smoking, stop any regular Alcohol  and or any Recreational drug use.  Wear Seat belts while driving.   Please note  You were cared for by a hospitalist during your hospital stay. If you have any questions about your discharge medications or the care you received while you were in the hospital after you are discharged, you can call the unit and asked to speak with the hospitalist on call if the hospitalist that took care of you is not available. Once you are discharged, your primary care physician will handle any further medical issues. Please note that NO REFILLS for any discharge medications will be authorized once you are discharged, as it is imperative that you return to your primary care physician (or establish a relationship with a primary care physician if you do not have one) for your aftercare needs so that they can reassess your need for medications and monitor your lab values.

## 2018-06-14 NOTE — Evaluation (Signed)
Physical Therapy Evaluation Patient Details Name: Megan Stevenson MRN: [id number] DOB: Sep 11, 1941 Today's Date: 06/14/2018   History of Present Illness  77 y.o. female with PMHx significant of DM2, CKD, HTN. Pt presented with intermittent vertigo symptoms and some associated dizziness worse when she stands up. MRI showing no evidence of CVA while in the emergency department but did noted to be transient bradycardic  Clinical Impression  Pt admitted with above. Pt with noted impaired balance and ataxic gait. When given RW pt with improved stability and decreased fall risk. Pts HR during amb inc to 97 bpm, at rest HR was 61 bpm. Pt also with difficulty tracking finger. 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 Recommend pt follow up with HHPT for vestibular and balance training.    Follow Up Recommendations Home health PT;Supervision/Assistance - 24 hour    Equipment Recommendations  Rolling walker with 5" wheels    Recommendations for Other Services       Precautions / Restrictions Precautions Precautions: Fall Restrictions Weight Bearing Restrictions: No      Mobility  Bed Mobility Overal bed mobility: Modified Independent                Transfers Overall transfer level: Needs assistance Equipment used: None Transfers: Sit to/from Stand Sit to Stand: Min guard;Min assist         General transfer comment: pt with good technique however pt unsteady with first step requiring minA  Ambulation/Gait Ambulation/Gait assistance: Min assist;Supervision Gait Distance (Feet): 170 Feet(x2) Assistive device: Rolling walker (2 wheeled);None Gait Pattern/deviations: Step-through pattern;Decreased stride length;Ataxic;Wide base of support Gait velocity: slow Gait velocity interpretation: <1.31 ft/sec, indicative of household ambulator General Gait Details: pt amb without AD and was very unsteady. pt with ataxia in bilat LE especially R side. Pt  reports "I feel like i'm stepping wrong" pt denies altered senstation in LEs.  pt given RW pt with significant improvement in stability and patient reports she feels much better  Stairs            Wheelchair Mobility    Modified Rankin (Stroke Patients Only)       Balance Overall balance assessment: Needs assistance Sitting-balance support: Feet supported;No upper extremity supported Sitting balance-Leahy Scale: Good     Standing balance support: No upper extremity supported;During functional activity Standing balance-Leahy Scale: Poor Standing balance comment: requires physical assist or RW for safe ambulation                             Pertinent Vitals/Pain Pain Assessment: No/denies pain    Home Living Family/patient expects to be discharged to:: Private residence Living Arrangements: Spouse/significant other(ex-husband) Available Help at Discharge: Family Type of Home: House Home Access: Level entry     Home Layout: One level Home Equipment: None      Prior Function Level of Independence: Independent               Hand Dominance        Extremity/Trunk Assessment   Upper Extremity Assessment Upper Extremity Assessment: Overall WFL for tasks assessed    Lower Extremity Assessment Lower Extremity Assessment: RLE deficits/detail;LLE deficits/detail RLE Deficits / Details: grossly MMT 5/5 however co-ordination grossly impaired especially during function RLE Coordination: decreased gross motor LLE Deficits / Details: grossly 5/5 MMT however grossly impaired  co-ordination LLE Coordination: decreased gross motor    Cervical / Trunk Assessment Cervical / Trunk Assessment:  Normal  Communication   Communication: No difficulties  Cognition Arousal/Alertness: Awake/alert Behavior During Therapy: WFL for tasks assessed/performed Overall Cognitive Status: Within Functional Limits for tasks assessed                                         General Comments General comments (skin integrity, edema, etc.): VSS, pt assessed for BPPV as pt reports mild dizziness occasionally with standing up too fast. Pt with no nystagmus or report of dizziness during testing. Pt may have a hypofuntion as she demonstrates difficulty tracking a finger and reports onset of dizziens with that. She also report "sometimes I have ringing in my ear"    Exercises     Assessment/Plan    PT Assessment Patient needs continued PT services  PT Problem List Decreased strength;Decreased range of motion;Decreased activity tolerance;Decreased balance;Decreased mobility;Decreased coordination;Decreased cognition;Decreased knowledge of use of DME;Decreased safety awareness       PT Treatment Interventions DME instruction;Gait training;Stair training;Functional mobility training;Therapeutic activities;Therapeutic exercise;Balance training;Neuromuscular re-education;Other (comment)(vestibular treatment)    PT Goals (Current goals can be found in the Care Plan section)  Acute Rehab PT Goals Patient Stated Goal: return home  PT Goal Formulation: With patient Time For Goal Achievement: 06/28/18 Potential to Achieve Goals: Good    Frequency Min 4X/week   Barriers to discharge        Co-evaluation               AM-PAC PT "6 Clicks" Daily Activity  Outcome Measure Difficulty turning over in bed (including adjusting bedclothes, sheets and blankets)?: None Difficulty moving from lying on back to sitting on the side of the bed? : None Difficulty sitting down on and standing up from a chair with arms (e.g., wheelchair, bedside commode, etc,.)?: A Little Help needed moving to and from a bed to chair (including a wheelchair)?: A Little Help needed walking in hospital room?: A Little Help needed climbing 3-5 steps with a railing? : A Little 6 Click Score: 20    End of Session Equipment Utilized During Treatment: Gait belt Activity Tolerance:  Patient tolerated treatment well Patient left: in bed;with call bell/phone within reach Nurse Communication: Mobility status PT Visit Diagnosis: Unsteadiness on feet (R26.81);Ataxic gait (R26.0)    Time: 3976-7341 PT Time Calculation (min) (ACUTE ONLY): 15 min   Charges:   PT Evaluation $PT Eval Low Complexity: 1 Low     PT G Codes:        Kittie Plater, PT, DPT Pager #: [phone] Office #: [phone]   [place] 06/14/2018, 3:53 PM

## 2018-06-14 NOTE — Progress Notes (Signed)
Clonidine patch was removed by MD order. Will continue to monitor.

## 2018-06-14 NOTE — Progress Notes (Signed)
On ambulation HR is in high 80s. When resting HR is in low 60s and high 50s. MD made aware. Will continue to monitor.

## 2018-06-14 NOTE — Care Management Note (Signed)
Case Management Note  Patient Details  Name: Megan Stevenson MRN: [id number] Date of Birth: 29-Oct-1941  Subjective/Objective:                 Spoke w patient. She would like to use [facility] for [facility] services. Referral made. RW to be delivered to room prior to DC   Action/Plan:   Expected Discharge Date:  06/14/18               Expected Discharge Plan:  [place]  In-House Referral:     Discharge planning Services  CM Consult  Post Acute Care Choice:  Durable Medical Equipment, Home Health Choice offered to:  Patient  DME Arranged:  [facility] D[facility] Agency:  [place] Arranged:  PT [facility] Agency:  [place]  Status of Service:  Completed, signed off  If discussed at [place] of Stay Meetings, dates discussed:    Additional Comments:  Carles Collet, RN 06/14/2018, 4:08 PM

## 2018-06-14 NOTE — Progress Notes (Signed)
Patient is waiting for her sister to come to pick her up. Because of the heavy rain she will be late. Will continue to monitor.

## 2018-06-14 NOTE — Evaluation (Signed)
Occupational Therapy Evaluation Patient Details Name: Megan Stevenson MRN: [id number] DOB: March 30, 1941 Today's Date: 06/14/2018    History of Present Illness 77 y.o. female with PMHx significant of DM2, CKD, HTN. Pt presented with intermittent vertigo symptoms and some associated dizziness worse when she stands up. MRI showing no evidence of CVA while in the emergency department but did noted to be transient bradycardic   Clinical Impression   This 77 y/o female presents with the above. At baseline pt is independent with ADLs and functional mobility. Pt demonstrating room level functional mobility this session without AD and overall mingaurd assist. Demonstrates standing grooming and LB ADL with overall minguard. Pt does report some dizziness with mobility which subsides when sitting. Will continue to follow while pt remains in acute setting to maximize her overall safety and independence with ADLs and mobility. Do not anticipate pt will require follow up OT services.     Follow Up Recommendations  No OT follow up;Supervision/Assistance - 24 hour    Equipment Recommendations  None recommended by OT           Precautions / Restrictions Precautions Precautions: Fall Restrictions Weight Bearing Restrictions: No      Mobility Bed Mobility Overal bed mobility: Modified Independent                Transfers Overall transfer level: Needs assistance   Transfers: Sit to/from Stand Sit to Stand: Min guard         General transfer comment: minguard for safety; no physical assist required     Balance Overall balance assessment: Mild deficits observed, not formally tested                                         ADL either performed or assessed with clinical judgement   ADL Overall ADL's : Needs assistance/impaired Eating/Feeding: Independent;Sitting   Grooming: Min guard;Standing;Oral care;Wash/dry face   Upper Body Bathing: Supervision/ safety;Sitting    Lower Body Bathing: Min guard;Sit to/from stand   Upper Body Dressing : Modified independent;Sitting   Lower Body Dressing: Min guard;Sit to/from stand   Toilet Transfer: Min guard;Ambulation;Regular Glass blower/designer Details (indicate cue type and reason): simulated in transfer to recliner Toileting- Clothing Manipulation and Hygiene: Min guard;Sit to/from stand       Functional mobility during ADLs: Min guard General ADL Comments: pt ambulating in room without AD and minguard assist; completing standing grooming ADLs prior to transfer to recliner. Pt reports some dizziness wtih standing and during mobility but reports it is manageable     Vision Baseline Vision/History: Wears glasses Wears Glasses: At all times Patient Visual Report: No change from baseline Vision Assessment?: Yes Eye Alignment: Within Functional Limits Ocular Range of Motion: Within Functional Limits Alignment/Gaze Preference: Within Defined Limits Tracking/Visual Pursuits: Able to track stimulus in all quads without difficulty Saccades: Within functional limits Visual Fields: No apparent deficits     Perception     Praxis      Pertinent Vitals/Pain Pain Assessment: No/denies pain     Hand Dominance     Extremity/Trunk Assessment Upper Extremity Assessment Upper Extremity Assessment: Overall WFL for tasks assessed   Lower Extremity Assessment Lower Extremity Assessment: Defer to PT evaluation   Cervical / Trunk Assessment Cervical / Trunk Assessment: Normal   Communication Communication Communication: No difficulties   Cognition Arousal/Alertness: Awake/alert Behavior During Therapy: WFL for tasks assessed/performed  Overall Cognitive Status: Within Functional Limits for tasks assessed                                     General Comments       Exercises     Shoulder Instructions      Home Living Family/patient expects to be discharged to:: Private  residence Living Arrangements: Spouse/significant other(ex-husband) Available Help at Discharge: Family Type of Home: House Home Access: Level entry     Home Layout: One level     Bathroom Shower/Tub: Teacher, early years/pre: Standard     Home Equipment: None          Prior Functioning/Environment Level of Independence: Independent                 OT Problem List: Decreased activity tolerance;Impaired balance (sitting and/or standing)      OT Treatment/Interventions: Self-care/ADL training;DME and/or AE instruction;Therapeutic activities;Balance training;Therapeutic exercise;Patient/family education    OT Goals(Current goals can be found in the care plan section) Acute Rehab OT Goals Patient Stated Goal: return home  OT Goal Formulation: With patient Time For Goal Achievement: 06/28/18 Potential to Achieve Goals: Good  OT Frequency: Min 2X/week   Barriers to D/C:            Co-evaluation              AM-PAC PT "6 Clicks" Daily Activity     Outcome Measure Help from another person eating meals?: None Help from another person taking care of personal grooming?: None Help from another person toileting, which includes using toliet, bedpan, or urinal?: A Little Help from another person bathing (including washing, rinsing, drying)?: A Little Help from another person to put on and taking off regular upper body clothing?: None Help from another person to put on and taking off regular lower body clothing?: A Little 6 Click Score: 21   End of Session Equipment Utilized During Treatment: Gait belt Nurse Communication: Mobility status  Activity Tolerance: Patient tolerated treatment well Patient left: in chair;with call bell/phone within reach  OT Visit Diagnosis: Dizziness and giddiness (R42)                Time: 1024-1040 OT Time Calculation (min): 16 min Charges:  OT General Charges $OT Visit: 1 Visit OT Evaluation $OT Eval Low Complexity:  1 Low G-Codes:     Lou Cal, OT Pager [phone] 06/14/2018   Raymondo Band 06/14/2018, 12:17 PM

## 2018-06-14 NOTE — Discharge Summary (Addendum)
Megan Stevenson, is a 77 y.o. female  DOB April 04, 1941  MRN [id number].  Admission date:  06/13/2018  Admitting Physician  Toy Baker, MD  Discharge Date:  06/14/2018   Primary MD  Velna Hatchet, MD  Recommendations for primary care physician for things to follow:  -Check CBC, BMP during next visit -Please monitor heart rate, blood pressure closely, adjust medication as needed, Coreg has been stopped, clonidine has been lowered given bradycardia, added hydrochlorothiazide for her antihypertensive regimen   Admission Diagnosis  Ataxia [R27.0] Bradycardia [R00.1] Chronic kidney disease, unspecified CKD stage [N18.9]   Discharge Diagnosis  Ataxia [R27.0] Bradycardia [R00.1] Chronic kidney disease, unspecified CKD stage [N18.9]    Principal Problem:   Ataxia Active Problems:   Bradycardia, sinus   Hypertension   Diet-controlled diabetes mellitus ([place])   CKD (chronic kidney disease) stage 4, GFR 15-29 ml/min (HCC)   Anemia   Heart murmur   Bradycardia      Past Medical History:  Diagnosis Date  . Anemia   . Arthritis   . Diabetes mellitus without complication (HCC)    no medications  . Dysrhythmia   . Hypertension   . Macular hole    OD  . Renal disorder    Sees someone [facility]  . Wears glasses     Past Surgical History:  Procedure Laterality Date  . [place] VITRECTOMY WITH 20 GAUGE MVR PORT FOR MACULAR HOLE Right 06/14/2014   Procedure: 25 GAUGE PARS PLANA VITRECTOMY WITH 20 GAUGE MVR PORT FOR MACULAR HOLE; serum patch; gas injection; laser treament; C3F8 gas fluid exchange.;  Surgeon: Hayden Pedro, MD;  Location: [place];  Service: Ophthalmology;  Laterality: Right;  . ABDOMINAL HYSTERECTOMY    . EYE SURGERY    . GANGLION CYST EXCISION Left 02/02/2015   Procedure: EXCISION OF VOLAR GANGLION CYST LEFT WRIST;  Surgeon: Leanora Cover, MD;  Location: [place];  Service: Orthopedics;  Laterality: Left;  [place] GAS INSERTION Right 07/19/2014   Procedure: INSERTION OF GAS;  Surgeon: Hayden Pedro, MD;  Location: [place];  Service: Ophthalmology;  Laterality: Right;  C3F8  . LASER PHOTO ABLATION Right 07/19/2014   Procedure: LASER PHOTO ABLATION;  Surgeon: Hayden Pedro, MD;  Location: [place];  Service: Ophthalmology;  Laterality: Right;  . PARS PLANA VITRECTOMY W/ REPAIR OF MACULAR HOLE Right 06/14/2014   DR MATTHEWS  . SCLERAL BUCKLE Right 07/19/2014   Procedure: SCLERAL BUCKLE;  Surgeon: Hayden Pedro, MD;  Location: [place];  Service: Ophthalmology;  Laterality: Right;       History of present illness and  Hospital Course:     Kindly see H&P for history of present illness and admission details, please review complete Labs, Consult reports and Test reports for all details in brief  HPI  from the history and physical done on the day of admission 06/13/2018   HPI: [name] is a 77 y.o. female with medical history significant for diabetes off medications  and other treatments for multiple years, hypertension on multiple medications, progressive chronic kidney disease now at stage IV, iron replacement and arthritis.  Patient presented to the ER today after noticing several days of ataxia with associated dizziness and intermittent vertigo symptoms.  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  MRI revealed no evidence of acute stroke.  Patient was noted to be bradycardic during her stay with rates as low as 44 bpm.  She is on carvedilol.  Patient does have some short-term memory deficits and additional history obtained by family at bedside.  Patient has been having similar issues for several months and was actually evaluated by PCP 2 months ago and was started on meclizine without any improvement in her symptoms.  Patient reports that she typically notices the symptoms when she gets up too fast and if she takes her time  getting up or stands still/ sits down the symptoms resolved.  ED Course: Vital Signs: BP (!) 153/95   Pulse (!) 52   Temp 98.2 F (36.8 C)   Resp 13   Ht 5\' 9"  (1.753 m)   Wt 68 kg (150 lb)   SpO2 99%   BMI 22.15 kg/m  CT head/MRI brain: As above Lab data: BUN 140, potassium 3.8, chloride 112, CO2 19, BUN 25, creatinine 2.15, glucose 107, calcium 10.2, anion gap 9, LFTs normal, troponin normal, white count 4900 with normal differential, hemoglobin 11.5 with normal MCV, platelets 302,006 normal. Medications and treatments:  meclizine 25 mg x 1    Hospital Course   Lightheadedness/unsteady gait -No clear etiology, but most likely bradycardia contributing to her symptoms, MRI with no acute ischemic events, the echo was obtained, she has a preserved EF, with no regional motion abnormalities, and no significant valvular disease, she was seen by PT/OT, recommendation for home PT with walker, likely bradycardia contributing to her symptoms.  Sinus bradycardia -This is most likely medication induced, as her symptoms are typical for lightheadedness upon trying to stand up, I have stopped her Coreg 25 mg oral daily, and I have lowered her clonidine patch dose from 0.3 to 0.2 mg, heart rate overnight sustained in the mid 40s, today it was in the high 50s, but she did respond up and p.o. appropriately, on ambulation went up to the 80s,   Hypertension -Continue with Cozaar, amlodipine, I have stopped Coreg, and creased clonidine dose, she was started on hydrochlorothiazide  CKD stage IV -Baseline is 1.6-1.8, she was on admission, today she is 2.0 on discharge, continue to monitor closely  Anemia of chronic kidney disease -Continue with oral iron supplements  Diabetes mellitus -Diet controlled, A1c is 5.7   Discharge Condition:  stable   Follow UP  Follow-up Information    Velna Hatchet, MD Follow up in 1 week(s).   Specialty:  Internal Medicine Contact information: [address] [place] [address] [phone]             Discharge Instructions  and  Discharge Medications    Discharge Instructions    Discharge instructions   Complete by:  As directed    Follow with Primary MD Velna Hatchet, MD in 7 days   Get CBC, CMP, 2 view Chest X ray checked  by Primary MD next visit.    Activity: As tolerated with Full fall precautions use walker/cane & assistance as needed   Disposition Home    Diet: Heart Healthy, carbohydrate modified, with feeding assistance and aspiration precautions.  For Heart failure patients - Check your Weight same time everyday, if you gain over 2 pounds, or you develop in leg swelling, experience more shortness of breath or chest pain, call your Primary MD immediately. Follow Cardiac Low Salt Diet and 1.5 lit/day fluid restriction.   On your next visit with your primary care physician please Get Medicines reviewed and adjusted.   Please request your Prim.MD to go over all Hospital Tests and Procedure/Radiological results at the follow up, please get all Hospital records sent to your Prim MD by signing hospital release before you go home.   If you experience worsening of your admission symptoms, develop shortness of breath, life threatening emergency, suicidal or homicidal thoughts you must seek medical attention immediately by calling 911 or calling your MD immediately  if symptoms less severe.  You Must read complete instructions/literature along with all the possible adverse reactions/side effects for all the Medicines you take and that have been prescribed to you. Take any new Medicines after you have completely understood and accpet all the possible adverse reactions/side effects.   Do not drive, operating heavy machinery, perform activities at heights, swimming or participation in water activities or provide baby sitting services if your were admitted for syncope or siezures until you have seen by Primary MD or a  Neurologist and advised to do so again.  Do not drive when taking Pain medications.    Do not take more than prescribed Pain, Sleep and Anxiety Medications  Special Instructions: If you have smoked or chewed Tobacco  in the last 2 yrs please stop smoking, stop any regular Alcohol  and or any Recreational drug use.  Wear Seat belts while driving.   Please note  You were cared for by a hospitalist during your hospital stay. If you have any questions about your discharge medications or the care you received while you were in the hospital after you are discharged, you can call the unit and asked to speak with the hospitalist on call if the hospitalist that took care of you is not available. Once you are discharged, your primary care physician will handle any further medical issues. Please note that NO REFILLS for any discharge medications will be authorized once you are discharged, as it is imperative that you return to your primary care physician (or establish a relationship with a primary care physician if you do not have one) for your aftercare needs so that they can reassess your need for medications and monitor your lab values.   Increase activity slowly   Complete by:  As directed      Allergies as of 06/14/2018      Reactions   Nsaids Other (See Comments)   REACTION: Has chronic renal disease   Benazepril Hcl Other (See Comments)   REACTION: Dizziness   Furosemide Other (See Comments)   REACTION: Hypernatremia   Atenolol Other (See Comments)   REACTION: Headaches      Medication List    STOP taking these medications   carvedilol 25 MG tablet Commonly known as:  COREG   cloNIDine 0.3 mg/24hr patch Commonly known as:  CATAPRES - Dosed in mg/24 hr Replaced by:  cloNIDine 0.2 mg/24hr patch     TAKE these medications   amLODipine 10 MG tablet Commonly known as:  NORVASC Take 10 mg by mouth daily.   atorvastatin 10 MG tablet Commonly known as:  LIPITOR Take 10 mg by mouth  at bedtime.   cloNIDine 0.2 mg/24hr patch Commonly known  as:  CATAPRES - Dosed in mg/24 hr Place 1 patch (0.2 mg total) onto the skin once a week. Replaces:  cloNIDine 0.3 mg/24hr patch   ferrous sulfate 325 (65 FE) MG tablet Take 325 mg by mouth daily with breakfast.   hydrochlorothiazide 25 MG tablet Commonly known as:  HYDRODIURIL Take 1 tablet (25 mg total) by mouth daily. Start taking on:  06/15/2018   HYDROcodone-acetaminophen 5-325 MG tablet Commonly known as:  NORCO 1-2 tabs po q6 hours prn pain   losartan 100 MG tablet Commonly known as:  COZAAR Take 100 mg by mouth daily.   meclizine 25 MG tablet Commonly known as:  ANTIVERT Take 1 tablet (25 mg total) by mouth 3 (three) times daily as needed for dizziness.         Diet and Activity recommendation: See Discharge Instructions above   Consults obtained - none   Major procedures and Radiology Reports - PLEASE review detailed and final reports for all details, in brief -     Ct Head Wo Contrast  Result Date: 06/13/2018 CLINICAL DATA:  Dizziness and gait disturbance EXAM: CT HEAD WITHOUT CONTRAST TECHNIQUE: Contiguous axial images were obtained from the base of the skull through the vertex without intravenous contrast. COMPARISON:  December 04, 2013 FINDINGS: Brain: Ventricles and sulci are within normal limits for age. There is no intracranial mass, hemorrhage, extra-axial fluid collection, or midline shift. There is patchy small vessel disease in the centra semiovale bilaterally. There is evidence of decreased attenuation in the anterior limb of the right internal capsule, a finding present previously and felt to represent a prior small infarct in this area. There is decreased attenuation focally in the superior posterior left centrum semiovale, also present on previous study and felt to represent a prior small infarct in the white matter in this area. No acute appearing infarct is evident on this study. Vascular:  There is no appreciable hyperdense vessel. There is calcification in each carotid siphon region. Skull: The bony calvarium appears intact. 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 Sinuses/Orbits: There is mucosal thickening in multiple ethmoid air cells. There is mild mucosal thickening along each medial maxillary antrum. Frontal sinuses are essentially aplastic. No intraorbital lesions are evident. There is evidence of previous scleral banding on the right. Other: Mastoid air cells are clear. 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 No acute infarct is demonstrable on this current examination. No mass or hemorrhage. There is arterial vascular calcification. There are areas of paranasal sinus disease. Previous scleral banding noted on the right. Electronically Signed   By: Lowella Grip III M.D.   On: 06/13/2018 13:11   Mr Brain Wo Contrast  Result Date: 06/13/2018 CLINICAL DATA:  Dizzy.  Difficulty walking, leaning to the side. EXAM: MRI HEAD WITHOUT CONTRAST TECHNIQUE: Multiplanar, multiecho pulse sequences of the brain and surrounding structures were obtained without intravenous contrast. COMPARISON:  None. FINDINGS: Brain: No evidence for acute infarction, hemorrhage, mass lesion, hydrocephalus, or extra-axial fluid. Mild cerebral and cerebellar atrophy. Moderately advanced T2 and FLAIR hyperintensities in the white matter, likely small vessel disease. Small deep white matter and basal ganglia lacunar infarcts, as predicted from CT, without findings of acute ischemia. Vascular: Flow voids are maintained. Skull and upper cervical spine: Partial empty sella. Normal marrow signal. Mild tonsillar ectopia of 5 mm. Pilar Plate Chiari I malformation is not established. No hydromyelia. Sinuses/Orbits:  No layering sinus fluid. BILATERAL cataract extraction. Other: None. IMPRESSION:  Atrophy with moderately advanced small vessel disease. No acute intracranial findings. Mild tonsillar ectopia, without Chiari I malformation. This is unlikely to be contributory to the patient's symptoms. Partial empty sella, doubtful significance. Electronically Signed   By: Staci Righter M.D.   On: 06/13/2018 15:25    Micro Results    No results found for this or any previous visit (from the past 240 hour(s)).     Today   Subjective:   Kaliann Coryell today has no headache,no chest or abdominal pain,no new weakness tingling or numbness, feels much better  today.   Objective:   Blood pressure (!) 143/60, pulse (!) 49, temperature 98.5 F (36.9 C), temperature source Oral, resp. rate 18, height 5\' 9"  (1.753 m), weight 67.1 kg (147 lb 14.9 oz), SpO2 97 %.   Intake/Output Summary (Last 24 hours) at 06/14/2018 1546 Last data filed at 06/14/2018 0957 Gross per 24 hour  Intake 180 ml  Output -  Net 180 ml    Exam Awake Alert, Oriented x 3, No new F.N deficits, Normal affect.  Symmetrical Chest wall movement, Good air movement bilaterally, CTAB RRR,No Gallops,Rubs , No Parasternal Heave +ve B.Sounds, Abd Soft, Non tender, No rebound -guarding or rigidity. No Cyanosis, Clubbing or edema, No new Rash or bruise  Data Review   CBC w Diff:  Lab Results  Component Value Date   WBC 4.9 06/13/2018   HGB 11.9 (L) 06/13/2018   HCT 35.0 (L) 06/13/2018   PLT 302 06/13/2018   LYMPHOPCT 26 06/13/2018   MONOPCT 8 06/13/2018   EOSPCT 2 06/13/2018   BASOPCT 1 06/13/2018    CMP:  Lab Results  Component Value Date   NA 144 06/14/2018   K 3.9 06/14/2018   CL 116 (H) 06/14/2018   CO2 24 06/14/2018   BUN 29 (H) 06/14/2018   CREATININE 2.04 (H) 06/14/2018   PROT 6.6 06/13/2018   ALBUMIN 3.8 06/13/2018   BILITOT 0.7 06/13/2018   ALKPHOS 41 06/13/2018   AST 13 (L) 06/13/2018   ALT 15 06/13/2018  .   Total Time in preparing paper work, data evaluation and todays exam - 85  minutes  Phillips Climes M.D on 06/14/2018 at 3:46 PM  [facility]   Office  [phone]

## 2018-06-18 DIAGNOSIS — Z6823 Body mass index (BMI) 23.0-23.9, adult: Secondary | ICD-10-CM | POA: Diagnosis not present

## 2018-06-18 DIAGNOSIS — R260 Ataxic gait: Secondary | ICD-10-CM | POA: Diagnosis not present

## 2018-06-18 DIAGNOSIS — I10 Essential (primary) hypertension: Secondary | ICD-10-CM | POA: Diagnosis not present

## 2018-06-18 DIAGNOSIS — R918 Other nonspecific abnormal finding of lung field: Secondary | ICD-10-CM | POA: Diagnosis not present

## 2018-06-18 DIAGNOSIS — R42 Dizziness and giddiness: Secondary | ICD-10-CM | POA: Diagnosis not present

## 2018-06-18 DIAGNOSIS — E119 Type 2 diabetes mellitus without complications: Secondary | ICD-10-CM | POA: Diagnosis not present

## 2018-06-18 DIAGNOSIS — I129 Hypertensive chronic kidney disease with stage 1 through stage 4 chronic kidney disease, or unspecified chronic kidney disease: Secondary | ICD-10-CM | POA: Diagnosis not present

## 2018-06-18 DIAGNOSIS — R001 Bradycardia, unspecified: Secondary | ICD-10-CM | POA: Diagnosis not present

## 2018-06-18 DIAGNOSIS — N184 Chronic kidney disease, stage 4 (severe): Secondary | ICD-10-CM | POA: Diagnosis not present

## 2018-06-18 DIAGNOSIS — D508 Other iron deficiency anemias: Secondary | ICD-10-CM | POA: Diagnosis not present

## 2018-06-19 DIAGNOSIS — R42 Dizziness and giddiness: Secondary | ICD-10-CM | POA: Diagnosis not present

## 2018-06-19 DIAGNOSIS — M1990 Unspecified osteoarthritis, unspecified site: Secondary | ICD-10-CM | POA: Diagnosis not present

## 2018-06-19 DIAGNOSIS — D631 Anemia in chronic kidney disease: Secondary | ICD-10-CM | POA: Diagnosis not present

## 2018-06-19 DIAGNOSIS — R001 Bradycardia, unspecified: Secondary | ICD-10-CM | POA: Diagnosis not present

## 2018-06-19 DIAGNOSIS — R270 Ataxia, unspecified: Secondary | ICD-10-CM | POA: Diagnosis not present

## 2018-06-19 DIAGNOSIS — E1122 Type 2 diabetes mellitus with diabetic chronic kidney disease: Secondary | ICD-10-CM | POA: Diagnosis not present

## 2018-06-19 DIAGNOSIS — N184 Chronic kidney disease, stage 4 (severe): Secondary | ICD-10-CM | POA: Diagnosis not present

## 2018-06-19 DIAGNOSIS — I129 Hypertensive chronic kidney disease with stage 1 through stage 4 chronic kidney disease, or unspecified chronic kidney disease: Secondary | ICD-10-CM | POA: Diagnosis not present

## 2018-06-19 DIAGNOSIS — Z87891 Personal history of nicotine dependence: Secondary | ICD-10-CM | POA: Diagnosis not present

## 2018-06-24 DIAGNOSIS — E1122 Type 2 diabetes mellitus with diabetic chronic kidney disease: Secondary | ICD-10-CM | POA: Diagnosis not present

## 2018-06-24 DIAGNOSIS — R270 Ataxia, unspecified: Secondary | ICD-10-CM | POA: Diagnosis not present

## 2018-06-24 DIAGNOSIS — R42 Dizziness and giddiness: Secondary | ICD-10-CM | POA: Diagnosis not present

## 2018-06-24 DIAGNOSIS — I129 Hypertensive chronic kidney disease with stage 1 through stage 4 chronic kidney disease, or unspecified chronic kidney disease: Secondary | ICD-10-CM | POA: Diagnosis not present

## 2018-06-24 DIAGNOSIS — R001 Bradycardia, unspecified: Secondary | ICD-10-CM | POA: Diagnosis not present

## 2018-06-24 DIAGNOSIS — N184 Chronic kidney disease, stage 4 (severe): Secondary | ICD-10-CM | POA: Diagnosis not present

## 2018-06-26 DIAGNOSIS — R42 Dizziness and giddiness: Secondary | ICD-10-CM | POA: Diagnosis not present

## 2018-06-26 DIAGNOSIS — E1122 Type 2 diabetes mellitus with diabetic chronic kidney disease: Secondary | ICD-10-CM | POA: Diagnosis not present

## 2018-06-26 DIAGNOSIS — I129 Hypertensive chronic kidney disease with stage 1 through stage 4 chronic kidney disease, or unspecified chronic kidney disease: Secondary | ICD-10-CM | POA: Diagnosis not present

## 2018-06-26 DIAGNOSIS — R001 Bradycardia, unspecified: Secondary | ICD-10-CM | POA: Diagnosis not present

## 2018-06-26 DIAGNOSIS — R270 Ataxia, unspecified: Secondary | ICD-10-CM | POA: Diagnosis not present

## 2018-06-26 DIAGNOSIS — N184 Chronic kidney disease, stage 4 (severe): Secondary | ICD-10-CM | POA: Diagnosis not present

## 2018-06-29 ENCOUNTER — Encounter (INDEPENDENT_AMBULATORY_CARE_PROVIDER_SITE_OTHER): Payer: Medicare Other | Admitting: Ophthalmology

## 2018-06-30 DIAGNOSIS — I129 Hypertensive chronic kidney disease with stage 1 through stage 4 chronic kidney disease, or unspecified chronic kidney disease: Secondary | ICD-10-CM | POA: Diagnosis not present

## 2018-06-30 DIAGNOSIS — R001 Bradycardia, unspecified: Secondary | ICD-10-CM | POA: Diagnosis not present

## 2018-06-30 DIAGNOSIS — R42 Dizziness and giddiness: Secondary | ICD-10-CM | POA: Diagnosis not present

## 2018-06-30 DIAGNOSIS — N184 Chronic kidney disease, stage 4 (severe): Secondary | ICD-10-CM | POA: Diagnosis not present

## 2018-06-30 DIAGNOSIS — R270 Ataxia, unspecified: Secondary | ICD-10-CM | POA: Diagnosis not present

## 2018-06-30 DIAGNOSIS — E1122 Type 2 diabetes mellitus with diabetic chronic kidney disease: Secondary | ICD-10-CM | POA: Diagnosis not present

## 2018-07-03 DIAGNOSIS — I129 Hypertensive chronic kidney disease with stage 1 through stage 4 chronic kidney disease, or unspecified chronic kidney disease: Secondary | ICD-10-CM | POA: Diagnosis not present

## 2018-07-03 DIAGNOSIS — R42 Dizziness and giddiness: Secondary | ICD-10-CM | POA: Diagnosis not present

## 2018-07-03 DIAGNOSIS — E1122 Type 2 diabetes mellitus with diabetic chronic kidney disease: Secondary | ICD-10-CM | POA: Diagnosis not present

## 2018-07-03 DIAGNOSIS — N184 Chronic kidney disease, stage 4 (severe): Secondary | ICD-10-CM | POA: Diagnosis not present

## 2018-07-03 DIAGNOSIS — R001 Bradycardia, unspecified: Secondary | ICD-10-CM | POA: Diagnosis not present

## 2018-07-03 DIAGNOSIS — R270 Ataxia, unspecified: Secondary | ICD-10-CM | POA: Diagnosis not present

## 2018-07-07 DIAGNOSIS — E1122 Type 2 diabetes mellitus with diabetic chronic kidney disease: Secondary | ICD-10-CM | POA: Diagnosis not present

## 2018-07-07 DIAGNOSIS — R001 Bradycardia, unspecified: Secondary | ICD-10-CM | POA: Diagnosis not present

## 2018-07-07 DIAGNOSIS — I129 Hypertensive chronic kidney disease with stage 1 through stage 4 chronic kidney disease, or unspecified chronic kidney disease: Secondary | ICD-10-CM | POA: Diagnosis not present

## 2018-07-07 DIAGNOSIS — N184 Chronic kidney disease, stage 4 (severe): Secondary | ICD-10-CM | POA: Diagnosis not present

## 2018-07-07 DIAGNOSIS — R270 Ataxia, unspecified: Secondary | ICD-10-CM | POA: Diagnosis not present

## 2018-07-07 DIAGNOSIS — R42 Dizziness and giddiness: Secondary | ICD-10-CM | POA: Diagnosis not present

## 2018-07-13 DIAGNOSIS — I129 Hypertensive chronic kidney disease with stage 1 through stage 4 chronic kidney disease, or unspecified chronic kidney disease: Secondary | ICD-10-CM | POA: Diagnosis not present

## 2018-07-13 DIAGNOSIS — E1122 Type 2 diabetes mellitus with diabetic chronic kidney disease: Secondary | ICD-10-CM | POA: Diagnosis not present

## 2018-07-13 DIAGNOSIS — R270 Ataxia, unspecified: Secondary | ICD-10-CM | POA: Diagnosis not present

## 2018-07-13 DIAGNOSIS — R001 Bradycardia, unspecified: Secondary | ICD-10-CM | POA: Diagnosis not present

## 2018-07-13 DIAGNOSIS — R42 Dizziness and giddiness: Secondary | ICD-10-CM | POA: Diagnosis not present

## 2018-07-13 DIAGNOSIS — N184 Chronic kidney disease, stage 4 (severe): Secondary | ICD-10-CM | POA: Diagnosis not present

## 2018-07-20 DIAGNOSIS — N184 Chronic kidney disease, stage 4 (severe): Secondary | ICD-10-CM | POA: Diagnosis not present

## 2018-07-20 DIAGNOSIS — R413 Other amnesia: Secondary | ICD-10-CM | POA: Diagnosis not present

## 2018-07-20 DIAGNOSIS — R42 Dizziness and giddiness: Secondary | ICD-10-CM | POA: Diagnosis not present

## 2018-07-20 DIAGNOSIS — E1169 Type 2 diabetes mellitus with other specified complication: Secondary | ICD-10-CM | POA: Diagnosis not present

## 2018-07-20 DIAGNOSIS — N183 Chronic kidney disease, stage 3 (moderate): Secondary | ICD-10-CM | POA: Diagnosis not present

## 2018-07-20 DIAGNOSIS — E1122 Type 2 diabetes mellitus with diabetic chronic kidney disease: Secondary | ICD-10-CM | POA: Diagnosis not present

## 2018-07-20 DIAGNOSIS — I10 Essential (primary) hypertension: Secondary | ICD-10-CM | POA: Diagnosis not present

## 2018-07-20 DIAGNOSIS — R260 Ataxic gait: Secondary | ICD-10-CM | POA: Diagnosis not present

## 2018-07-20 DIAGNOSIS — I129 Hypertensive chronic kidney disease with stage 1 through stage 4 chronic kidney disease, or unspecified chronic kidney disease: Secondary | ICD-10-CM | POA: Diagnosis not present

## 2018-07-20 DIAGNOSIS — R001 Bradycardia, unspecified: Secondary | ICD-10-CM | POA: Diagnosis not present

## 2018-07-20 DIAGNOSIS — R270 Ataxia, unspecified: Secondary | ICD-10-CM | POA: Diagnosis not present

## 2018-07-20 DIAGNOSIS — R918 Other nonspecific abnormal finding of lung field: Secondary | ICD-10-CM | POA: Diagnosis not present

## 2018-07-20 DIAGNOSIS — Z6823 Body mass index (BMI) 23.0-23.9, adult: Secondary | ICD-10-CM | POA: Diagnosis not present

## 2018-07-28 DIAGNOSIS — N184 Chronic kidney disease, stage 4 (severe): Secondary | ICD-10-CM | POA: Diagnosis not present

## 2018-07-28 DIAGNOSIS — R270 Ataxia, unspecified: Secondary | ICD-10-CM | POA: Diagnosis not present

## 2018-07-28 DIAGNOSIS — I129 Hypertensive chronic kidney disease with stage 1 through stage 4 chronic kidney disease, or unspecified chronic kidney disease: Secondary | ICD-10-CM | POA: Diagnosis not present

## 2018-07-28 DIAGNOSIS — E1122 Type 2 diabetes mellitus with diabetic chronic kidney disease: Secondary | ICD-10-CM | POA: Diagnosis not present

## 2018-07-28 DIAGNOSIS — R001 Bradycardia, unspecified: Secondary | ICD-10-CM | POA: Diagnosis not present

## 2018-07-28 DIAGNOSIS — R42 Dizziness and giddiness: Secondary | ICD-10-CM | POA: Diagnosis not present

## 2018-08-14 DIAGNOSIS — I129 Hypertensive chronic kidney disease with stage 1 through stage 4 chronic kidney disease, or unspecified chronic kidney disease: Secondary | ICD-10-CM | POA: Diagnosis not present

## 2018-08-14 DIAGNOSIS — R001 Bradycardia, unspecified: Secondary | ICD-10-CM | POA: Diagnosis not present

## 2018-08-14 DIAGNOSIS — N184 Chronic kidney disease, stage 4 (severe): Secondary | ICD-10-CM | POA: Diagnosis not present

## 2018-08-14 DIAGNOSIS — E1122 Type 2 diabetes mellitus with diabetic chronic kidney disease: Secondary | ICD-10-CM | POA: Diagnosis not present

## 2018-08-14 DIAGNOSIS — R270 Ataxia, unspecified: Secondary | ICD-10-CM | POA: Diagnosis not present

## 2018-08-14 DIAGNOSIS — R42 Dizziness and giddiness: Secondary | ICD-10-CM | POA: Diagnosis not present

## 2019-01-22 DIAGNOSIS — N184 Chronic kidney disease, stage 4 (severe): Secondary | ICD-10-CM | POA: Diagnosis not present

## 2019-01-22 DIAGNOSIS — E7849 Other hyperlipidemia: Secondary | ICD-10-CM | POA: Diagnosis not present

## 2019-01-22 DIAGNOSIS — E1169 Type 2 diabetes mellitus with other specified complication: Secondary | ICD-10-CM | POA: Diagnosis not present

## 2019-01-22 DIAGNOSIS — M109 Gout, unspecified: Secondary | ICD-10-CM | POA: Diagnosis not present

## 2019-02-03 DIAGNOSIS — Z23 Encounter for immunization: Secondary | ICD-10-CM | POA: Diagnosis not present

## 2019-02-03 DIAGNOSIS — R413 Other amnesia: Secondary | ICD-10-CM | POA: Diagnosis not present

## 2019-02-03 DIAGNOSIS — H3530 Unspecified macular degeneration: Secondary | ICD-10-CM | POA: Diagnosis not present

## 2019-02-03 DIAGNOSIS — Z1331 Encounter for screening for depression: Secondary | ICD-10-CM | POA: Diagnosis not present

## 2019-02-03 DIAGNOSIS — Z0000 Encounter for general adult medical examination without abnormal findings: Secondary | ICD-10-CM | POA: Diagnosis not present

## 2019-02-03 DIAGNOSIS — E1169 Type 2 diabetes mellitus with other specified complication: Secondary | ICD-10-CM | POA: Diagnosis not present

## 2019-02-03 DIAGNOSIS — Z6824 Body mass index (BMI) 24.0-24.9, adult: Secondary | ICD-10-CM | POA: Diagnosis not present

## 2019-02-03 DIAGNOSIS — I129 Hypertensive chronic kidney disease with stage 1 through stage 4 chronic kidney disease, or unspecified chronic kidney disease: Secondary | ICD-10-CM | POA: Diagnosis not present

## 2019-02-03 DIAGNOSIS — D508 Other iron deficiency anemias: Secondary | ICD-10-CM | POA: Diagnosis not present

## 2019-02-03 DIAGNOSIS — M109 Gout, unspecified: Secondary | ICD-10-CM | POA: Diagnosis not present

## 2019-02-03 DIAGNOSIS — G608 Other hereditary and idiopathic neuropathies: Secondary | ICD-10-CM | POA: Diagnosis not present

## 2019-02-03 DIAGNOSIS — N184 Chronic kidney disease, stage 4 (severe): Secondary | ICD-10-CM | POA: Diagnosis not present

## 2019-03-10 DIAGNOSIS — E1122 Type 2 diabetes mellitus with diabetic chronic kidney disease: Secondary | ICD-10-CM | POA: Diagnosis not present

## 2019-03-10 DIAGNOSIS — N184 Chronic kidney disease, stage 4 (severe): Secondary | ICD-10-CM | POA: Diagnosis not present

## 2019-03-10 DIAGNOSIS — F0390 Unspecified dementia without behavioral disturbance: Secondary | ICD-10-CM | POA: Diagnosis not present

## 2019-03-10 DIAGNOSIS — M179 Osteoarthritis of knee, unspecified: Secondary | ICD-10-CM | POA: Diagnosis not present

## 2019-03-10 DIAGNOSIS — I129 Hypertensive chronic kidney disease with stage 1 through stage 4 chronic kidney disease, or unspecified chronic kidney disease: Secondary | ICD-10-CM | POA: Diagnosis not present

## 2019-03-10 DIAGNOSIS — E1142 Type 2 diabetes mellitus with diabetic polyneuropathy: Secondary | ICD-10-CM | POA: Diagnosis not present

## 2019-03-11 DIAGNOSIS — N184 Chronic kidney disease, stage 4 (severe): Secondary | ICD-10-CM | POA: Diagnosis not present

## 2019-03-11 DIAGNOSIS — E1142 Type 2 diabetes mellitus with diabetic polyneuropathy: Secondary | ICD-10-CM | POA: Diagnosis not present

## 2019-03-11 DIAGNOSIS — I129 Hypertensive chronic kidney disease with stage 1 through stage 4 chronic kidney disease, or unspecified chronic kidney disease: Secondary | ICD-10-CM | POA: Diagnosis not present

## 2019-03-11 DIAGNOSIS — E1122 Type 2 diabetes mellitus with diabetic chronic kidney disease: Secondary | ICD-10-CM | POA: Diagnosis not present

## 2019-03-11 DIAGNOSIS — M179 Osteoarthritis of knee, unspecified: Secondary | ICD-10-CM | POA: Diagnosis not present

## 2019-03-11 DIAGNOSIS — F0390 Unspecified dementia without behavioral disturbance: Secondary | ICD-10-CM | POA: Diagnosis not present

## 2019-03-15 DIAGNOSIS — I129 Hypertensive chronic kidney disease with stage 1 through stage 4 chronic kidney disease, or unspecified chronic kidney disease: Secondary | ICD-10-CM | POA: Diagnosis not present

## 2019-03-15 DIAGNOSIS — E1122 Type 2 diabetes mellitus with diabetic chronic kidney disease: Secondary | ICD-10-CM | POA: Diagnosis not present

## 2019-03-15 DIAGNOSIS — F0390 Unspecified dementia without behavioral disturbance: Secondary | ICD-10-CM | POA: Diagnosis not present

## 2019-03-15 DIAGNOSIS — N184 Chronic kidney disease, stage 4 (severe): Secondary | ICD-10-CM | POA: Diagnosis not present

## 2019-03-15 DIAGNOSIS — E1142 Type 2 diabetes mellitus with diabetic polyneuropathy: Secondary | ICD-10-CM | POA: Diagnosis not present

## 2019-03-15 DIAGNOSIS — M179 Osteoarthritis of knee, unspecified: Secondary | ICD-10-CM | POA: Diagnosis not present

## 2019-03-17 DIAGNOSIS — N184 Chronic kidney disease, stage 4 (severe): Secondary | ICD-10-CM | POA: Diagnosis not present

## 2019-03-17 DIAGNOSIS — I129 Hypertensive chronic kidney disease with stage 1 through stage 4 chronic kidney disease, or unspecified chronic kidney disease: Secondary | ICD-10-CM | POA: Diagnosis not present

## 2019-03-17 DIAGNOSIS — M179 Osteoarthritis of knee, unspecified: Secondary | ICD-10-CM | POA: Diagnosis not present

## 2019-03-17 DIAGNOSIS — E1122 Type 2 diabetes mellitus with diabetic chronic kidney disease: Secondary | ICD-10-CM | POA: Diagnosis not present

## 2019-03-17 DIAGNOSIS — E1142 Type 2 diabetes mellitus with diabetic polyneuropathy: Secondary | ICD-10-CM | POA: Diagnosis not present

## 2019-03-17 DIAGNOSIS — F0390 Unspecified dementia without behavioral disturbance: Secondary | ICD-10-CM | POA: Diagnosis not present

## 2019-03-18 DIAGNOSIS — N184 Chronic kidney disease, stage 4 (severe): Secondary | ICD-10-CM | POA: Diagnosis not present

## 2019-03-18 DIAGNOSIS — E1142 Type 2 diabetes mellitus with diabetic polyneuropathy: Secondary | ICD-10-CM | POA: Diagnosis not present

## 2019-03-18 DIAGNOSIS — M179 Osteoarthritis of knee, unspecified: Secondary | ICD-10-CM | POA: Diagnosis not present

## 2019-03-18 DIAGNOSIS — E1122 Type 2 diabetes mellitus with diabetic chronic kidney disease: Secondary | ICD-10-CM | POA: Diagnosis not present

## 2019-03-18 DIAGNOSIS — F0390 Unspecified dementia without behavioral disturbance: Secondary | ICD-10-CM | POA: Diagnosis not present

## 2019-03-18 DIAGNOSIS — I129 Hypertensive chronic kidney disease with stage 1 through stage 4 chronic kidney disease, or unspecified chronic kidney disease: Secondary | ICD-10-CM | POA: Diagnosis not present

## 2019-03-22 DIAGNOSIS — M179 Osteoarthritis of knee, unspecified: Secondary | ICD-10-CM | POA: Diagnosis not present

## 2019-03-22 DIAGNOSIS — E1142 Type 2 diabetes mellitus with diabetic polyneuropathy: Secondary | ICD-10-CM | POA: Diagnosis not present

## 2019-03-22 DIAGNOSIS — I129 Hypertensive chronic kidney disease with stage 1 through stage 4 chronic kidney disease, or unspecified chronic kidney disease: Secondary | ICD-10-CM | POA: Diagnosis not present

## 2019-03-22 DIAGNOSIS — N184 Chronic kidney disease, stage 4 (severe): Secondary | ICD-10-CM | POA: Diagnosis not present

## 2019-03-22 DIAGNOSIS — F0390 Unspecified dementia without behavioral disturbance: Secondary | ICD-10-CM | POA: Diagnosis not present

## 2019-03-22 DIAGNOSIS — E1122 Type 2 diabetes mellitus with diabetic chronic kidney disease: Secondary | ICD-10-CM | POA: Diagnosis not present

## 2019-03-24 DIAGNOSIS — E1142 Type 2 diabetes mellitus with diabetic polyneuropathy: Secondary | ICD-10-CM | POA: Diagnosis not present

## 2019-03-24 DIAGNOSIS — F0390 Unspecified dementia without behavioral disturbance: Secondary | ICD-10-CM | POA: Diagnosis not present

## 2019-03-24 DIAGNOSIS — N184 Chronic kidney disease, stage 4 (severe): Secondary | ICD-10-CM | POA: Diagnosis not present

## 2019-03-24 DIAGNOSIS — M179 Osteoarthritis of knee, unspecified: Secondary | ICD-10-CM | POA: Diagnosis not present

## 2019-03-24 DIAGNOSIS — E1122 Type 2 diabetes mellitus with diabetic chronic kidney disease: Secondary | ICD-10-CM | POA: Diagnosis not present

## 2019-03-24 DIAGNOSIS — I129 Hypertensive chronic kidney disease with stage 1 through stage 4 chronic kidney disease, or unspecified chronic kidney disease: Secondary | ICD-10-CM | POA: Diagnosis not present

## 2019-03-31 DIAGNOSIS — F0390 Unspecified dementia without behavioral disturbance: Secondary | ICD-10-CM | POA: Diagnosis not present

## 2019-03-31 DIAGNOSIS — E1142 Type 2 diabetes mellitus with diabetic polyneuropathy: Secondary | ICD-10-CM | POA: Diagnosis not present

## 2019-03-31 DIAGNOSIS — E1122 Type 2 diabetes mellitus with diabetic chronic kidney disease: Secondary | ICD-10-CM | POA: Diagnosis not present

## 2019-03-31 DIAGNOSIS — I129 Hypertensive chronic kidney disease with stage 1 through stage 4 chronic kidney disease, or unspecified chronic kidney disease: Secondary | ICD-10-CM | POA: Diagnosis not present

## 2019-03-31 DIAGNOSIS — N184 Chronic kidney disease, stage 4 (severe): Secondary | ICD-10-CM | POA: Diagnosis not present

## 2019-03-31 DIAGNOSIS — M179 Osteoarthritis of knee, unspecified: Secondary | ICD-10-CM | POA: Diagnosis not present

## 2019-04-05 DIAGNOSIS — E1142 Type 2 diabetes mellitus with diabetic polyneuropathy: Secondary | ICD-10-CM | POA: Diagnosis not present

## 2019-04-05 DIAGNOSIS — I129 Hypertensive chronic kidney disease with stage 1 through stage 4 chronic kidney disease, or unspecified chronic kidney disease: Secondary | ICD-10-CM | POA: Diagnosis not present

## 2019-04-05 DIAGNOSIS — F0390 Unspecified dementia without behavioral disturbance: Secondary | ICD-10-CM | POA: Diagnosis not present

## 2019-04-05 DIAGNOSIS — M179 Osteoarthritis of knee, unspecified: Secondary | ICD-10-CM | POA: Diagnosis not present

## 2019-04-05 DIAGNOSIS — E1122 Type 2 diabetes mellitus with diabetic chronic kidney disease: Secondary | ICD-10-CM | POA: Diagnosis not present

## 2019-04-05 DIAGNOSIS — N184 Chronic kidney disease, stage 4 (severe): Secondary | ICD-10-CM | POA: Diagnosis not present

## 2019-05-17 DIAGNOSIS — R634 Abnormal weight loss: Secondary | ICD-10-CM | POA: Diagnosis not present

## 2019-05-17 DIAGNOSIS — N184 Chronic kidney disease, stage 4 (severe): Secondary | ICD-10-CM | POA: Diagnosis not present

## 2019-05-17 DIAGNOSIS — R630 Anorexia: Secondary | ICD-10-CM | POA: Diagnosis not present

## 2019-05-17 DIAGNOSIS — E1169 Type 2 diabetes mellitus with other specified complication: Secondary | ICD-10-CM | POA: Diagnosis not present

## 2019-05-17 DIAGNOSIS — R410 Disorientation, unspecified: Secondary | ICD-10-CM | POA: Diagnosis not present

## 2019-05-17 DIAGNOSIS — R413 Other amnesia: Secondary | ICD-10-CM | POA: Diagnosis not present

## 2019-05-17 DIAGNOSIS — I129 Hypertensive chronic kidney disease with stage 1 through stage 4 chronic kidney disease, or unspecified chronic kidney disease: Secondary | ICD-10-CM | POA: Diagnosis not present

## 2019-05-18 DIAGNOSIS — R410 Disorientation, unspecified: Secondary | ICD-10-CM | POA: Diagnosis not present

## 2019-05-18 DIAGNOSIS — R8290 Unspecified abnormal findings in urine: Secondary | ICD-10-CM | POA: Diagnosis not present

## 2019-05-18 DIAGNOSIS — R7989 Other specified abnormal findings of blood chemistry: Secondary | ICD-10-CM | POA: Diagnosis not present

## 2019-05-25 DIAGNOSIS — I129 Hypertensive chronic kidney disease with stage 1 through stage 4 chronic kidney disease, or unspecified chronic kidney disease: Secondary | ICD-10-CM | POA: Diagnosis not present

## 2019-05-25 DIAGNOSIS — N183 Chronic kidney disease, stage 3 (moderate): Secondary | ICD-10-CM | POA: Diagnosis not present

## 2019-06-10 IMAGING — MR MR HEAD W/O CM
10 of 11 series · 43 of 48 positions shown · non-contrast
Comparison: None.

CLINICAL DATA: Dizzy.  Difficulty walking, leaning to the side.

EXAM:
MRI HEAD WITHOUT CONTRAST
TECHNIQUE: Multiplanar, multiecho pulse sequences of the brain and surrounding
structures were obtained without intravenous contrast.

[Series 5: ax dwi_tracew · axial · 3.0mm · 1.50mm/px · z∈[-90,+40]mm · 9 of 84 slices shown]
[im 1/84]
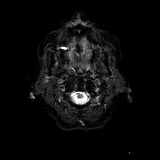
[im 11/84]
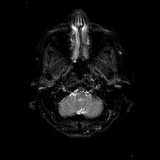
[im 21/84]
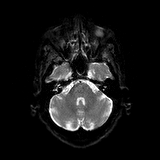
[im 32/84]
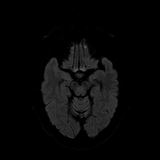
[im 42/84]
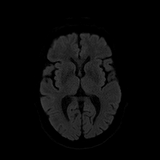
[im 52/84]
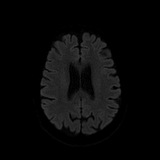
[im 63/84]
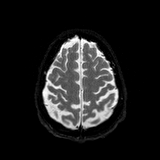
[im 73/84]
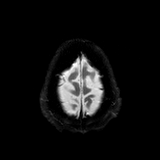
[im 84/84]
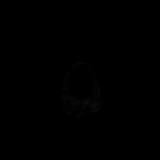

[Series 6: ax dwi_adc · axial · 3.0mm · 1.50mm/px · z∈[-90,+40]mm · 4 of 42 slices shown]
[im 1/42]
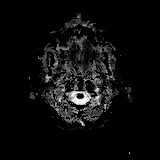
[im 14/42]
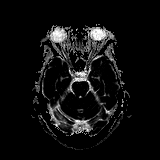
[im 28/42]
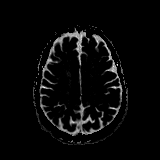
[im 42/42]
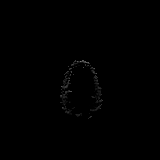

[Series 7: cor dwi_tracew · coronal · 5.0mm · 1.44mm/px · 7 of 68 slices shown]
[im 1/68]
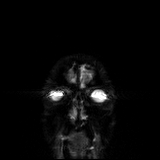
[im 12/68]
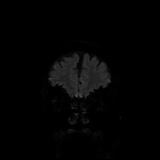
[im 23/68]
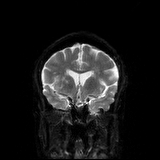
[im 34/68]
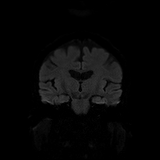
[im 45/68]
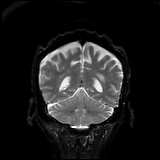
[im 56/68]
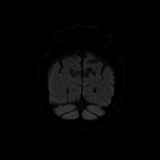
[im 68/68]
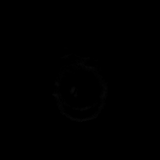

[Series 8: cor dwi_adc · coronal · 5.0mm · 1.44mm/px · 3 of 34 slices shown]
[im 1/34]
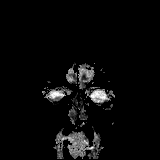
[im 17/34]
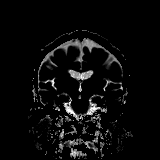
[im 34/34]
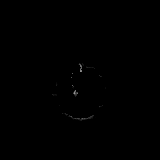

[Series 9: T1 · sagittal · 5.0mm · 0.75mm/px · 2 of 23 slices shown]
[im 1/23]
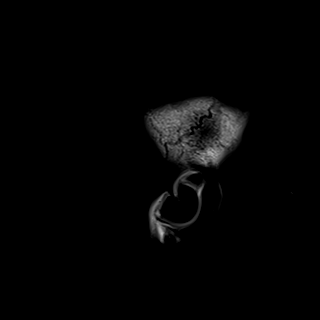
[im 23/23]
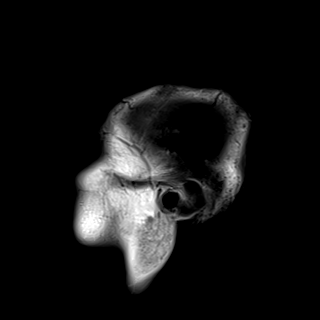

[Series 10: T2 · axial · 5.0mm · 0.72mm/px · z∈[-102,+39]mm · 3 of 27 slices shown (1 of 2)]
[im 1/27]
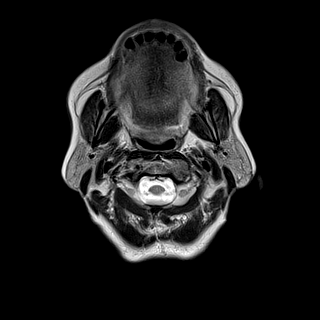
[im 14/27]
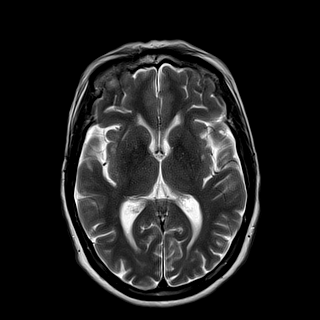
[im 27/27]
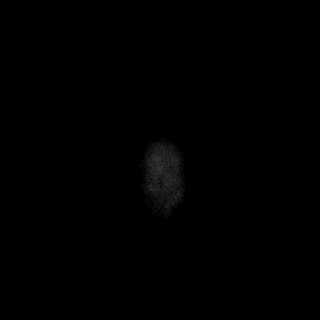

[Series 11: FLAIR · axial · 5.0mm · 0.45mm/px · z∈[-103,+39]mm · 3 of 27 slices shown]
[im 1/27]
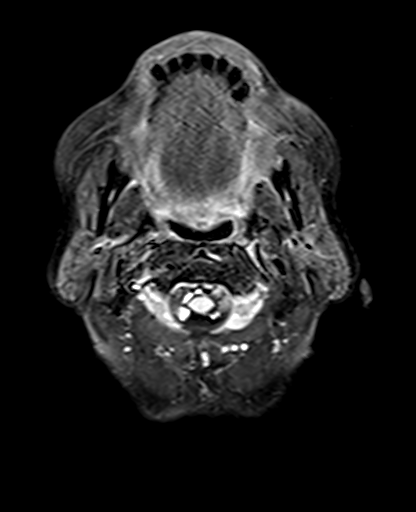
[im 14/27]
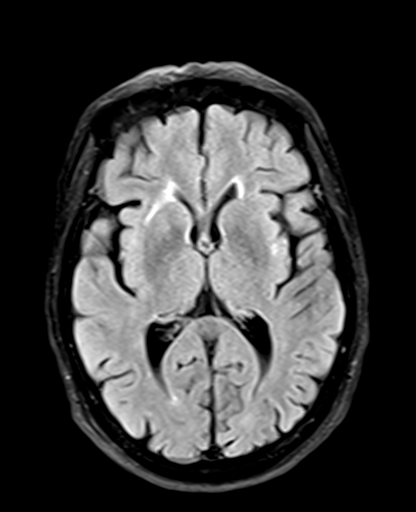
[im 27/27]
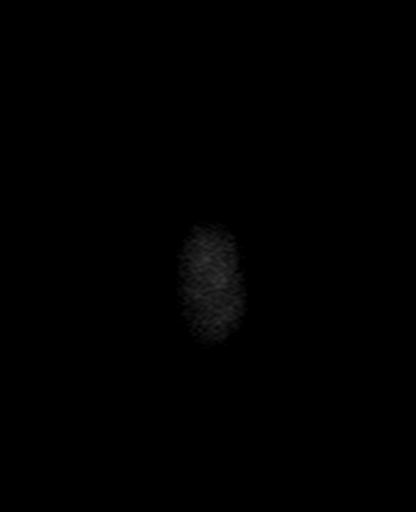

[Series 12: swi_images · axial · 3.0mm · 0.90mm/px · z∈[-96,+43]mm · 5 of 52 slices shown]
[im 1/52]
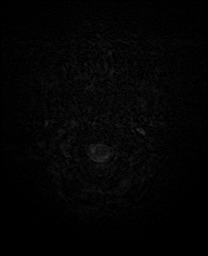
[im 13/52]
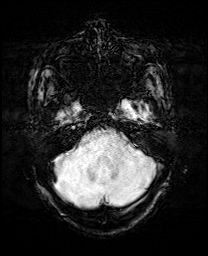
[im 26/52]
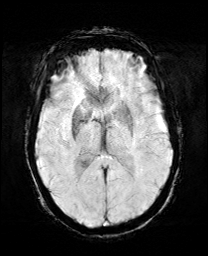
[im 39/52]
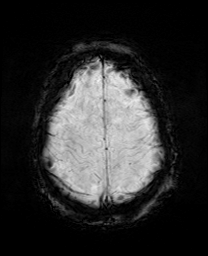
[im 52/52]
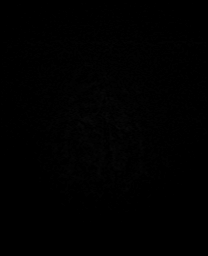

[Series 13: mip_images(sw) · axial · 24.0mm · 0.90mm/px · z∈[-87,+34]mm · 4 of 45 slices shown]
[im 1/45]
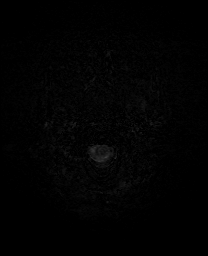
[im 15/45]
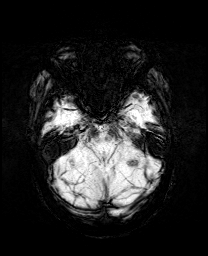
[im 30/45]
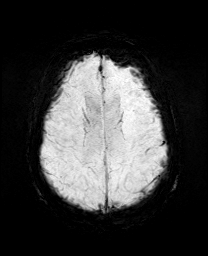
[im 45/45]
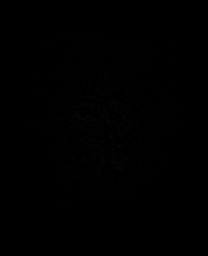

[Series 15: T2 · coronal · 5.0mm · 0.34mm/px · 3 of 29 slices shown (2 of 2)]
[im 1/29]
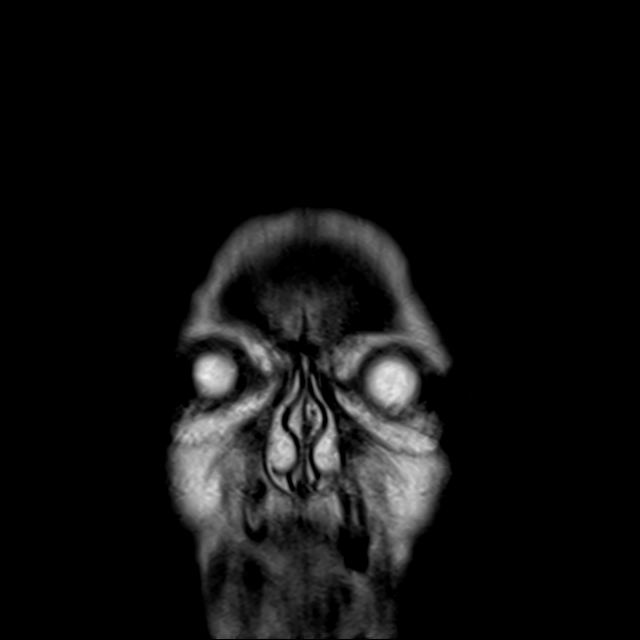
[im 15/29]
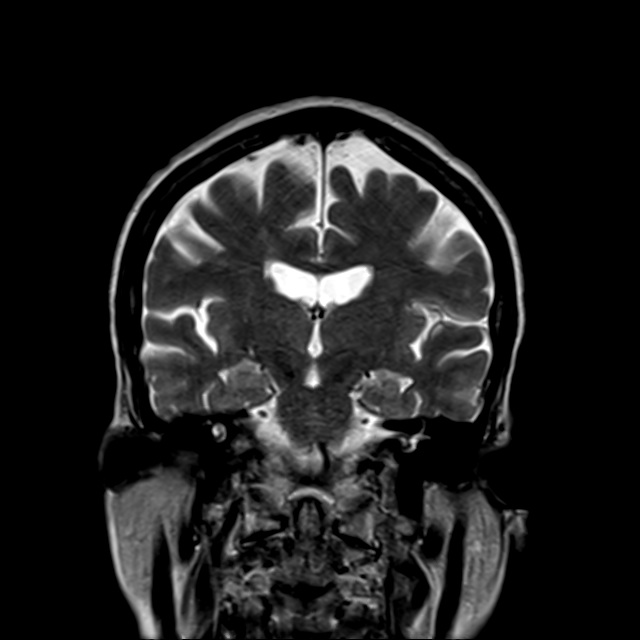
[im 29/29]
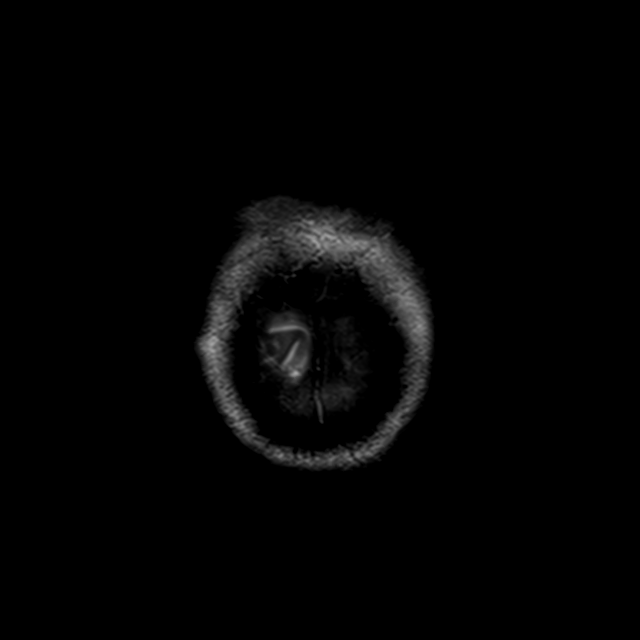

[43 of 48 positions shown; findings below may reference images not displayed]

FINDINGS: Brain: No evidence for acute infarction, hemorrhage, mass lesion,
hydrocephalus, or extra-axial fluid. Mild cerebral and cerebellar
atrophy. Moderately advanced T2 and FLAIR hyperintensities in the
white matter, likely small vessel disease.

Small deep white matter and basal ganglia lacunar infarcts, as
predicted from CT, without findings of acute ischemia.

Vascular: Flow voids are maintained.

Skull and upper cervical spine: Partial empty sella. Normal marrow
signal. Mild tonsillar ectopia of 5 mm. Frank Chiari I malformation
is not established. No hydromyelia.

Sinuses/Orbits: No layering sinus fluid. BILATERAL cataract
extraction.

Other: None.
IMPRESSION: Atrophy with moderately advanced small vessel disease.

No acute intracranial findings.

Mild tonsillar ectopia, without Chiari I malformation. This is
unlikely to be contributory to the patient's symptoms.

Partial empty sella, doubtful significance.

## 2019-08-23 DIAGNOSIS — N183 Chronic kidney disease, stage 3 (moderate): Secondary | ICD-10-CM | POA: Diagnosis not present

## 2019-08-23 DIAGNOSIS — I129 Hypertensive chronic kidney disease with stage 1 through stage 4 chronic kidney disease, or unspecified chronic kidney disease: Secondary | ICD-10-CM | POA: Diagnosis not present

## 2019-08-23 DIAGNOSIS — M1990 Unspecified osteoarthritis, unspecified site: Secondary | ICD-10-CM | POA: Diagnosis not present

## 2019-08-23 DIAGNOSIS — E1169 Type 2 diabetes mellitus with other specified complication: Secondary | ICD-10-CM | POA: Diagnosis not present

## 2020-02-11 DIAGNOSIS — E1140 Type 2 diabetes mellitus with diabetic neuropathy, unspecified: Secondary | ICD-10-CM | POA: Diagnosis not present

## 2020-02-11 DIAGNOSIS — M109 Gout, unspecified: Secondary | ICD-10-CM | POA: Diagnosis not present

## 2020-03-02 DIAGNOSIS — M109 Gout, unspecified: Secondary | ICD-10-CM | POA: Diagnosis not present

## 2020-03-02 DIAGNOSIS — E7849 Other hyperlipidemia: Secondary | ICD-10-CM | POA: Diagnosis not present

## 2020-03-02 DIAGNOSIS — E1169 Type 2 diabetes mellitus with other specified complication: Secondary | ICD-10-CM | POA: Diagnosis not present

## 2020-03-03 DIAGNOSIS — R630 Anorexia: Secondary | ICD-10-CM | POA: Diagnosis not present

## 2020-03-03 DIAGNOSIS — N184 Chronic kidney disease, stage 4 (severe): Secondary | ICD-10-CM | POA: Diagnosis not present

## 2020-03-03 DIAGNOSIS — I129 Hypertensive chronic kidney disease with stage 1 through stage 4 chronic kidney disease, or unspecified chronic kidney disease: Secondary | ICD-10-CM | POA: Diagnosis not present

## 2020-03-03 DIAGNOSIS — E876 Hypokalemia: Secondary | ICD-10-CM | POA: Diagnosis not present

## 2020-03-15 DIAGNOSIS — N184 Chronic kidney disease, stage 4 (severe): Secondary | ICD-10-CM | POA: Diagnosis not present

## 2020-03-15 DIAGNOSIS — M5136 Other intervertebral disc degeneration, lumbar region: Secondary | ICD-10-CM | POA: Diagnosis not present

## 2020-03-15 DIAGNOSIS — D692 Other nonthrombocytopenic purpura: Secondary | ICD-10-CM | POA: Diagnosis not present

## 2020-03-15 DIAGNOSIS — I129 Hypertensive chronic kidney disease with stage 1 through stage 4 chronic kidney disease, or unspecified chronic kidney disease: Secondary | ICD-10-CM | POA: Diagnosis not present

## 2020-03-15 DIAGNOSIS — Z1331 Encounter for screening for depression: Secondary | ICD-10-CM | POA: Diagnosis not present

## 2020-03-15 DIAGNOSIS — E1169 Type 2 diabetes mellitus with other specified complication: Secondary | ICD-10-CM | POA: Diagnosis not present

## 2020-03-15 DIAGNOSIS — E1140 Type 2 diabetes mellitus with diabetic neuropathy, unspecified: Secondary | ICD-10-CM | POA: Diagnosis not present

## 2020-03-15 DIAGNOSIS — M109 Gout, unspecified: Secondary | ICD-10-CM | POA: Diagnosis not present

## 2020-03-15 DIAGNOSIS — H3530 Unspecified macular degeneration: Secondary | ICD-10-CM | POA: Diagnosis not present

## 2020-03-15 DIAGNOSIS — Z0000 Encounter for general adult medical examination without abnormal findings: Secondary | ICD-10-CM | POA: Diagnosis not present

## 2020-03-15 DIAGNOSIS — R413 Other amnesia: Secondary | ICD-10-CM | POA: Diagnosis not present

## 2020-03-15 DIAGNOSIS — R260 Ataxic gait: Secondary | ICD-10-CM | POA: Diagnosis not present

## 2020-03-15 DIAGNOSIS — R634 Abnormal weight loss: Secondary | ICD-10-CM | POA: Diagnosis not present

## 2020-05-10 DIAGNOSIS — D631 Anemia in chronic kidney disease: Secondary | ICD-10-CM | POA: Diagnosis not present

## 2020-05-10 DIAGNOSIS — D519 Vitamin B12 deficiency anemia, unspecified: Secondary | ICD-10-CM | POA: Diagnosis not present

## 2020-05-10 DIAGNOSIS — N183 Chronic kidney disease, stage 3 unspecified: Secondary | ICD-10-CM | POA: Diagnosis not present

## 2020-05-10 DIAGNOSIS — I129 Hypertensive chronic kidney disease with stage 1 through stage 4 chronic kidney disease, or unspecified chronic kidney disease: Secondary | ICD-10-CM | POA: Diagnosis not present

## 2020-06-27 DIAGNOSIS — E876 Hypokalemia: Secondary | ICD-10-CM | POA: Diagnosis not present

## 2020-06-27 DIAGNOSIS — D509 Iron deficiency anemia, unspecified: Secondary | ICD-10-CM | POA: Diagnosis not present

## 2020-06-27 DIAGNOSIS — N184 Chronic kidney disease, stage 4 (severe): Secondary | ICD-10-CM | POA: Diagnosis not present

## 2020-06-27 DIAGNOSIS — E1169 Type 2 diabetes mellitus with other specified complication: Secondary | ICD-10-CM | POA: Diagnosis not present

## 2020-06-27 DIAGNOSIS — I129 Hypertensive chronic kidney disease with stage 1 through stage 4 chronic kidney disease, or unspecified chronic kidney disease: Secondary | ICD-10-CM | POA: Diagnosis not present

## 2020-06-27 DIAGNOSIS — R413 Other amnesia: Secondary | ICD-10-CM | POA: Diagnosis not present

## 2020-08-20 ENCOUNTER — Emergency Department (HOSPITAL_COMMUNITY): Payer: Medicare Other

## 2020-08-20 ENCOUNTER — Emergency Department (HOSPITAL_COMMUNITY)
Admission: EM | Admit: 2020-08-20 | Discharge: 2020-08-21 | Disposition: A | Discharge status: Home / Self Care | Payer: Medicare Other | Attending: Emergency Medicine | Admitting: Emergency Medicine

## 2020-08-20 ENCOUNTER — Other Ambulatory Visit: Payer: Self-pay

## 2020-08-20 ENCOUNTER — Encounter (HOSPITAL_COMMUNITY): Payer: Self-pay | Admitting: Emergency Medicine

## 2020-08-20 DIAGNOSIS — I10 Essential (primary) hypertension: Secondary | ICD-10-CM | POA: Diagnosis not present

## 2020-08-20 DIAGNOSIS — Y9389 Activity, other specified: Secondary | ICD-10-CM | POA: Diagnosis not present

## 2020-08-20 DIAGNOSIS — S92322A Displaced fracture of second metatarsal bone, left foot, initial encounter for closed fracture: Secondary | ICD-10-CM | POA: Insufficient documentation

## 2020-08-20 DIAGNOSIS — E1122 Type 2 diabetes mellitus with diabetic chronic kidney disease: Secondary | ICD-10-CM | POA: Insufficient documentation

## 2020-08-20 DIAGNOSIS — Y999 Unspecified external cause status: Secondary | ICD-10-CM | POA: Insufficient documentation

## 2020-08-20 DIAGNOSIS — N184 Chronic kidney disease, stage 4 (severe): Secondary | ICD-10-CM | POA: Insufficient documentation

## 2020-08-20 DIAGNOSIS — Z043 Encounter for examination and observation following other accident: Secondary | ICD-10-CM | POA: Diagnosis not present

## 2020-08-20 DIAGNOSIS — I129 Hypertensive chronic kidney disease with stage 1 through stage 4 chronic kidney disease, or unspecified chronic kidney disease: Secondary | ICD-10-CM | POA: Insufficient documentation

## 2020-08-20 DIAGNOSIS — M7989 Other specified soft tissue disorders: Secondary | ICD-10-CM | POA: Diagnosis not present

## 2020-08-20 DIAGNOSIS — S92302A Fracture of unspecified metatarsal bone(s), left foot, initial encounter for closed fracture: Secondary | ICD-10-CM

## 2020-08-20 DIAGNOSIS — Z87891 Personal history of nicotine dependence: Secondary | ICD-10-CM | POA: Insufficient documentation

## 2020-08-20 DIAGNOSIS — Z79899 Other long term (current) drug therapy: Secondary | ICD-10-CM | POA: Diagnosis not present

## 2020-08-20 DIAGNOSIS — W19XXXA Unspecified fall, initial encounter: Secondary | ICD-10-CM | POA: Diagnosis not present

## 2020-08-20 DIAGNOSIS — W010XXA Fall on same level from slipping, tripping and stumbling without subsequent striking against object, initial encounter: Secondary | ICD-10-CM | POA: Diagnosis not present

## 2020-08-20 DIAGNOSIS — Y92009 Unspecified place in unspecified non-institutional (private) residence as the place of occurrence of the external cause: Secondary | ICD-10-CM | POA: Diagnosis not present

## 2020-08-20 DIAGNOSIS — S99922A Unspecified injury of left foot, initial encounter: Secondary | ICD-10-CM | POA: Diagnosis present

## 2020-08-20 DIAGNOSIS — R52 Pain, unspecified: Secondary | ICD-10-CM | POA: Diagnosis not present

## 2020-08-20 DIAGNOSIS — S92351A Displaced fracture of fifth metatarsal bone, right foot, initial encounter for closed fracture: Secondary | ICD-10-CM | POA: Diagnosis not present

## 2020-08-20 DIAGNOSIS — M25571 Pain in right ankle and joints of right foot: Secondary | ICD-10-CM | POA: Diagnosis not present

## 2020-08-20 NOTE — ED Triage Notes (Signed)
Per EMS, pt from home, had a mechanical fall and now her right ankle is swollen and appears deformed.  Initially bp was 220/98 however has come down since arriving at the hospital.    83 HR 93% on RA

## 2020-08-20 NOTE — ED Notes (Signed)
Delfino Lovett (son) would like to be call at [phone] when pt is discharge. He will be her ride home.

## 2020-08-21 ENCOUNTER — Emergency Department (HOSPITAL_COMMUNITY): Payer: Medicare Other

## 2020-08-21 ENCOUNTER — Other Ambulatory Visit: Payer: Self-pay

## 2020-08-21 DIAGNOSIS — M7989 Other specified soft tissue disorders: Secondary | ICD-10-CM | POA: Diagnosis not present

## 2020-08-21 DIAGNOSIS — Z043 Encounter for examination and observation following other accident: Secondary | ICD-10-CM | POA: Diagnosis not present

## 2020-08-21 DIAGNOSIS — S92322A Displaced fracture of second metatarsal bone, left foot, initial encounter for closed fracture: Secondary | ICD-10-CM | POA: Diagnosis not present

## 2020-08-21 MED ORDER — TRAMADOL HCL 50 MG PO TABS: 50.0000 mg | ORAL_TABLET | Freq: Four times a day (QID) | ORAL | 0 refills | Status: DC | PRN

## 2020-08-21 NOTE — Discharge Instructions (Signed)
After a fall last week, your x-rays today confirm that you broke the second through fifth metatarsal heads on your right foot.  This is the area you had the swelling, bruising, and tenderness.  Given your lack of ankle tenderness I do not suspect an ankle fracture.  The x-ray of your knee did not show any other injuries.  Please use the fracture shoe and crutches to help take the pressure off of your foot.  Please use the pain medicine to help with symptoms.  May also use anti-inflammatory medications.  Please follow-up with orthopedics for further management.  Please rest.  If any symptoms change or worsen, please return to the nearest emergency department.

## 2020-08-21 NOTE — ED Provider Notes (Signed)
[facility] EMERGENCY DEPARTMENT Provider Note   CSN: [id number] Arrival date & time: 08/20/20  2012     History Chief Complaint  Patient presents with  . Fall  . Ankle Pain    Megan Stevenson is a 79 y.o. female.  The history is provided by the patient and medical records. No language interpreter was used.  Foot Pain This is a new problem. The current episode started more than 1 week ago. The problem occurs constantly. The problem has not changed since onset.Pertinent negatives include no chest pain, no abdominal pain, no headaches and no shortness of breath. Nothing aggravates the symptoms. Nothing relieves the symptoms. She has tried nothing for the symptoms. The treatment provided no relief.       Past Medical History:  Diagnosis Date  . Anemia   . Arthritis   . Diabetes mellitus without complication (HCC)    no medications  . Dysrhythmia   . Hypertension   . Macular hole    OD  . Renal disorder    Sees someone [facility]  . Wears glasses     Patient Active Problem List   Diagnosis Date Noted  . 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 Bradycardia, sinus 06/13/2018  . Hypertension 06/13/2018  . Diet-controlled diabetes mellitus ([place]) 06/13/2018  . CKD (chronic kidney disease) stage 4, GFR 15-29 ml/min (HCC) 06/13/2018  . Anemia 06/13/2018  . Heart murmur 06/13/2018  . Bradycardia 06/13/2018  . 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 DIABETES MELLITUS, TYPE II 07/24/2007  . DYSLIPIDEMIA 07/24/2007  . 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  . ANEMIA-IRON DEFICIENCY 07/24/2007  . HYPERTENSION 07/24/2007  . PUD 07/24/2007  . 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 RENAL INSUFFICIENCY 07/24/2007  . ROTATOR CUFF SYNDROME, 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  . SUBACROMIAL BURSITIS, 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  . HEART MURMUR, HX OF 07/24/2007  .  COLONIC POLYPS, HX OF 07/24/2007    Past Surgical History:  Procedure Laterality Date  . [place] VITRECTOMY WITH 20 GAUGE MVR PORT FOR MACULAR HOLE Right 06/14/2014   Procedure: 25 GAUGE PARS PLANA VITRECTOMY WITH 20 GAUGE MVR PORT FOR MACULAR HOLE; serum patch; gas injection; laser treament; C3F8 gas fluid exchange.;  Surgeon: Hayden Pedro, MD;  Location: [facility];  Service: Ophthalmology;  Laterality: Right;  . ABDOMINAL HYSTERECTOMY    . EYE SURGERY    . GANGLION CYST EXCISION Left 02/02/2015   Procedure: EXCISION OF VOLAR GANGLION CYST LEFT WRIST;  Surgeon: Leanora Cover, MD;  Location: [facility];  Service: Orthopedics;  Laterality: Left;  Marland Kitchen GAS INSERTION Right 07/19/2014   Procedure: INSERTION OF GAS;  Surgeon: Hayden Pedro, MD;  Location: [facility];  Service: Ophthalmology;  Laterality: Right;  C3F8  . LASER PHOTO ABLATION Right 07/19/2014   Procedure: LASER PHOTO ABLATION;  Surgeon: Hayden Pedro, MD;  Location: [facility];  Service: Ophthalmology;  Laterality: Right;  . PARS PLANA VITRECTOMY W/ REPAIR OF MACULAR HOLE Right 06/14/2014   DR MATTHEWS  . SCLERAL BUCKLE Right 07/19/2014   Procedure: SCLERAL BUCKLE;  Surgeon: Hayden Pedro, MD;  Location: [facility];  Service: Ophthalmology;  Laterality: Right;     OB History   No obstetric history on file.     No family history on file.  Social History   Tobacco Use  . Smoking status: Former Smoker  Packs/day: 0.25    Years: 3.00    Pack years: 0.75    Types: Cigarettes    Quit date: 12/09/1993    Years since quitting: 26.7  . Smokeless tobacco: Never Used  Substance Use Topics  . Alcohol use: No  . Drug use: No    Home Medications Prior to Admission medications   Medication Sig Start Date End Date Taking? Authorizing Provider  amLODipine (NORVASC) 10 MG tablet Take 10 mg by mouth daily.    [provider]  atorvastatin (LIPITOR) 10 MG tablet Take 10 mg by mouth at bedtime. 11/07/17   [provider]  cloNIDine (CATAPRES - DOSED IN MG/24 HR) 0.2 mg/24hr patch Place 1 patch (0.2 mg total) onto the skin once a week. 06/14/18   Elgergawy, Silver Huguenin, MD  ferrous sulfate 325 (65 FE) MG tablet Take 325 mg by mouth daily with breakfast.    [provider]  hydrochlorothiazide (HYDRODIURIL) 25 MG tablet Take 1 tablet (25 mg total) by mouth daily. 06/15/18   Elgergawy, Silver Huguenin, MD  HYDROcodone-acetaminophen (NORCO) 5-325 MG per tablet 1-2 tabs po q6 hours prn pain Patient not taking: Reported on 11/11/2017 02/02/15   Leanora Cover, MD  losartan (COZAAR) 100 MG tablet Take 100 mg by mouth daily.    [provider]  meclizine (ANTIVERT) 25 MG tablet Take 1 tablet (25 mg total) by mouth 3 (three) times daily as needed for dizziness. 12/04/13   Francine Graven, DO    Allergies    Nsaids, Benazepril hcl, Furosemide, and Atenolol  Review of Systems   Review of Systems  Constitutional: Negative for chills, diaphoresis, fatigue and fever.  HENT: Negative for congestion.   Eyes: Negative for visual disturbance.  Respiratory: Negative for cough, chest tightness and shortness of breath.   Cardiovascular: Negative for chest pain and palpitations.  Gastrointestinal: Negative for abdominal pain, constipation, diarrhea, nausea and vomiting.  Genitourinary: Negative for dysuria and flank pain.  Musculoskeletal: Negative for back pain, neck pain and neck stiffness.  Skin: Positive for color change (bruising). Negative for rash and wound.  Neurological: Negative for dizziness, light-headedness, numbness and headaches.  Psychiatric/Behavioral: Negative for agitation.  All other systems reviewed and are negative.   Physical Exam Updated Vital Signs BP (!) 173/69   Pulse 67   Temp 99.2 F (37.3 C) (Oral)   Resp 16   Ht 5\' 9"  (1.753 m)   SpO2 100%   BMI 21.85 kg/m   Physical Exam Vitals and nursing note reviewed.  Constitutional:      General: She is not in acute  distress.    Appearance: She is well-developed. She is not ill-appearing, toxic-appearing or diaphoretic.  HENT:     Head: Normocephalic and atraumatic.  Eyes:     Extraocular Movements: Extraocular movements intact.     Conjunctiva/sclera: Conjunctivae normal.  Cardiovascular:     Rate and Rhythm: Normal rate and regular rhythm.     Pulses: Normal pulses.     Heart sounds: No murmur heard.   Pulmonary:     Effort: Pulmonary effort is normal. No respiratory distress.     Breath sounds: Normal breath sounds. No wheezing, rhonchi or rales.  Chest:     Chest wall: No tenderness.  Abdominal:     General: Abdomen is flat.     Palpations: Abdomen is soft.     Tenderness: There is no abdominal tenderness. There is no right CVA tenderness, left CVA tenderness, guarding or rebound.  Musculoskeletal:        General: Tenderness and signs of injury present.     Right knee: Normal range of motion. Tenderness present.     Right lower leg: No edema.     Left lower leg: No edema.     Right foot: Tenderness present.       Legs:     Comments: Normal sensation, strength, and pulses in lower extremities.  Tenderness in the dorsum of the right foot with bruising.  Normal capillary refill.  No tenderness in the ankle.  Tenderness present in the knee but no tenderness in the hip.  Range of motion is preserved in the knee.  Skin:    General: Skin is warm and dry.     Capillary Refill: Capillary refill takes less than 2 seconds.     Findings: Bruising present. No erythema or rash.  Neurological:     General: No focal deficit present.     Mental Status: She is alert.     Sensory: No sensory deficit.     Motor: No weakness.  Psychiatric:        Mood and Affect: Mood normal.     ED Results / Procedures / Treatments   Labs (all labs ordered are listed, but only abnormal results are displayed) Labs Reviewed - No data to display  EKG None  Radiology DG Ankle Complete Right  Result Date:  08/20/2020 CLINICAL DATA:  Pain and swelling status post fall EXAM: RIGHT ANKLE - COMPLETE 3+ VIEW COMPARISON:  None. FINDINGS: There is soft tissue swelling about the ankle. There is a questionable tiny avulsion fracture arising from the lateral calcaneus. There is osteopenia. No dislocation. There is a small plantar calcaneal spur. IMPRESSION: Soft tissue swelling about the ankle with a questionable tiny avulsion fracture arising from the lateral calcaneus. Osteopenia. Small plantar calcaneal spur. Electronically Signed   By: Constance Holster M.D.   On: 08/20/2020 20:29   DG Knee Complete 4 Views Right  Result Date: 08/21/2020 CLINICAL DATA:  Fall EXAM: RIGHT KNEE - COMPLETE 4+ VIEW COMPARISON:  None. FINDINGS: No fracture or dislocation is seen. Mild degenerative changes, most prominent in the lateral compartment, with bulky osteophytosis. The visualized soft tissues are unremarkable. No suprapatellar knee joint effusion. IMPRESSION: No fracture or dislocation is seen. Mild degenerative changes. Electronically Signed   By: Julian Hy M.D.   On: 08/21/2020 09:21   DG Foot Complete Right  Result Date: 08/21/2020 CLINICAL DATA:  Fall, dorsal foot pain EXAM: RIGHT FOOT COMPLETE - 3+ VIEW COMPARISON:  None. FINDINGS: Fracture of the distal 2nd metatarsal head. Mild irregularity in the same region of the distal 3rd and 4th metatarsal, suggesting additional fractures. Mild irregularity/angulation of the distal 5th metatarsal, equivocal. The joint spaces are preserved. Mild soft tissue swelling along the dorsal forefoot. Tiny plantar calcaneal enthesophyte. IMPRESSION: Suspected distal 2nd through 4th metatarsal head fractures. Possible distal 5th metatarsal fracture, equivocal. Correlate for point tenderness. Electronically Signed   By: Julian Hy M.D.   On: 08/21/2020 09:20    Procedures Procedures (including critical care time)  Medications Ordered in ED Medications - No data to  display  ED Course  I have reviewed the triage vital signs and the nursing notes.  Pertinent labs & imaging results that were available during my care of the patient were reviewed by me and considered in my medical decision making (see chart for details).    MDM Rules/Calculators/A&P  MCKINLEY ADELSTEIN is a 79 y.o. female with a past medical history significant for diabetes, dyslipidemia, CKD, and hypertension who presents with right leg pains after a fall.  Reports last week, she tripped and fell in the kitchen landing on her right leg and knee.  She reports that she is having pain distally with ambulate with discomfort.  She reports moderate severe pain in her right knee and right foot.  Patient has been waiting over 12 hours to be seen in the emergency department overnight.  She initially told it was her ankle hurting and had an x-ray done in triage however, she is primarily reporting pain in her dorsum of her right foot and her right knee as opposed to the ankle.  She denies hitting her head and having any pain in her chest, back, abdomen, or pelvis.  No hip pains.  On exam, patient does have bruising and swelling of the dorsum of her distal right foot.  He does have ability to use move her toes and have symmetric sensation in the toes bilaterally.  He has good DP and PT pulses present.  No ankle tenderness upon initial exam on the right ankle.  Patient has tenderness in the right knee as well as pain with some movement.  No hip tenderness.  Lungs clear, chest nontender.  Abdomen nontender.  Patient resting comfortably otherwise.  Patient will have x-rays obtained of the foot and knee as well as the ankle was done overnight.  The ankle x-ray showed possible calcaneus fracture however, he has no tenderness in this location so I do not suspect this is an actual fracture at this time.  Anticipate reassessment after imaging.  10:26 AM Patient's x-ray shows suspected second,  third, fourth, and possibly fifth distal metatarsal fracture.  Patient does have tenderness across this area so I do suspect these are new injuries.  Patient was placed into a protective boot, given crutches, and a prescription for pain medication.  She'll follow-up with orthopedics.  She agrees with plan of care and return precautions.  Patient discharged in good condition.  Final Clinical Impression(s) / ED Diagnoses Final diagnoses:  Closed fracture of head of metatarsal bone of left foot, initial encounter  Fall, initial encounter    Rx / DC Orders ED Discharge Orders         Ordered    traMADol (ULTRAM) 50 MG tablet  Every 6 hours PRN        08/21/20 1030          Clinical Impression: 1. Closed fracture of head of metatarsal bone of left foot, initial encounter   2. Fall, initial encounter     Disposition: Discharge  Condition: Good  I have discussed the results, Dx and Tx plan with the pt(& family if present). He/she/they expressed understanding and agree(s) with the plan. Discharge instructions discussed at great length. Strict return precautions discussed and pt &/or family have verbalized understanding of the instructions. No further questions at time of discharge.    New Prescriptions   TRAMADOL (ULTRAM) 50 MG TABLET    Take 1 tablet (50 mg total) by mouth every 6 (six) hours as needed.    Follow Up: [place], MD [place] [address] [phone]        Sinaya Minogue, Gwenyth Allegra, MD 08/21/20 1032

## 2020-08-21 NOTE — Progress Notes (Signed)
Orthopedic Tech Progress Note Patient Details:  Megan Stevenson 1941/01/15 [id number] RN called requesting a PAIR OF CRUTCHES for patient. Dropped off to RN Ortho Devices Type of Ortho Device: Crutches Ortho Device/Splint Interventions: Other (comment)   Post Interventions Patient Tolerated: Other (comment) Instructions Provided: Other (comment)   [name] 08/21/2020, 11:13 AM

## 2020-08-21 NOTE — ED Notes (Signed)
D/C instructions and prescriptions reviewed w/ pt.  Pt verbalized understanding.  Pt D/C.

## 2020-08-23 DIAGNOSIS — M25561 Pain in right knee: Secondary | ICD-10-CM | POA: Diagnosis not present

## 2020-08-23 DIAGNOSIS — E1140 Type 2 diabetes mellitus with diabetic neuropathy, unspecified: Secondary | ICD-10-CM | POA: Diagnosis not present

## 2020-08-23 DIAGNOSIS — M79671 Pain in right foot: Secondary | ICD-10-CM | POA: Diagnosis not present

## 2020-08-23 DIAGNOSIS — I129 Hypertensive chronic kidney disease with stage 1 through stage 4 chronic kidney disease, or unspecified chronic kidney disease: Secondary | ICD-10-CM | POA: Diagnosis not present

## 2020-08-23 DIAGNOSIS — N184 Chronic kidney disease, stage 4 (severe): Secondary | ICD-10-CM | POA: Diagnosis not present

## 2020-08-23 DIAGNOSIS — F0280 Dementia in other diseases classified elsewhere without behavioral disturbance: Secondary | ICD-10-CM | POA: Diagnosis not present

## 2020-08-25 ENCOUNTER — Emergency Department (HOSPITAL_COMMUNITY): Payer: Medicare Other

## 2020-08-25 ENCOUNTER — Emergency Department (HOSPITAL_COMMUNITY)
Admission: EM | Admit: 2020-08-25 | Discharge: 2020-08-26 | Disposition: A | Discharge status: Home / Self Care | Payer: Medicare Other | Attending: Emergency Medicine | Admitting: Emergency Medicine

## 2020-08-25 DIAGNOSIS — S92324A Nondisplaced fracture of second metatarsal bone, right foot, initial encounter for closed fracture: Secondary | ICD-10-CM

## 2020-08-25 DIAGNOSIS — N3000 Acute cystitis without hematuria: Secondary | ICD-10-CM | POA: Diagnosis not present

## 2020-08-25 DIAGNOSIS — R296 Repeated falls: Secondary | ICD-10-CM | POA: Insufficient documentation

## 2020-08-25 DIAGNOSIS — E1122 Type 2 diabetes mellitus with diabetic chronic kidney disease: Secondary | ICD-10-CM | POA: Insufficient documentation

## 2020-08-25 DIAGNOSIS — N184 Chronic kidney disease, stage 4 (severe): Secondary | ICD-10-CM | POA: Insufficient documentation

## 2020-08-25 DIAGNOSIS — Z87891 Personal history of nicotine dependence: Secondary | ICD-10-CM | POA: Diagnosis not present

## 2020-08-25 DIAGNOSIS — S92351A Displaced fracture of fifth metatarsal bone, right foot, initial encounter for closed fracture: Secondary | ICD-10-CM | POA: Diagnosis not present

## 2020-08-25 DIAGNOSIS — M7989 Other specified soft tissue disorders: Secondary | ICD-10-CM | POA: Diagnosis not present

## 2020-08-25 DIAGNOSIS — S92354A Nondisplaced fracture of fifth metatarsal bone, right foot, initial encounter for closed fracture: Secondary | ICD-10-CM | POA: Diagnosis not present

## 2020-08-25 DIAGNOSIS — S92309A Fracture of unspecified metatarsal bone(s), unspecified foot, initial encounter for closed fracture: Secondary | ICD-10-CM

## 2020-08-25 DIAGNOSIS — R7989 Other specified abnormal findings of blood chemistry: Secondary | ICD-10-CM

## 2020-08-25 DIAGNOSIS — I129 Hypertensive chronic kidney disease with stage 1 through stage 4 chronic kidney disease, or unspecified chronic kidney disease: Secondary | ICD-10-CM | POA: Insufficient documentation

## 2020-08-25 DIAGNOSIS — Y92019 Unspecified place in single-family (private) house as the place of occurrence of the external cause: Secondary | ICD-10-CM | POA: Insufficient documentation

## 2020-08-25 DIAGNOSIS — D649 Anemia, unspecified: Secondary | ICD-10-CM

## 2020-08-25 DIAGNOSIS — M25561 Pain in right knee: Secondary | ICD-10-CM | POA: Diagnosis not present

## 2020-08-25 DIAGNOSIS — Z041 Encounter for examination and observation following transport accident: Secondary | ICD-10-CM | POA: Diagnosis not present

## 2020-08-25 DIAGNOSIS — S92334A Nondisplaced fracture of third metatarsal bone, right foot, initial encounter for closed fracture: Secondary | ICD-10-CM

## 2020-08-25 DIAGNOSIS — R609 Edema, unspecified: Secondary | ICD-10-CM | POA: Diagnosis not present

## 2020-08-25 DIAGNOSIS — M25571 Pain in right ankle and joints of right foot: Secondary | ICD-10-CM | POA: Diagnosis not present

## 2020-08-25 DIAGNOSIS — W19XXXA Unspecified fall, initial encounter: Secondary | ICD-10-CM | POA: Diagnosis not present

## 2020-08-25 DIAGNOSIS — M79671 Pain in right foot: Secondary | ICD-10-CM | POA: Diagnosis not present

## 2020-08-25 DIAGNOSIS — M25461 Effusion, right knee: Secondary | ICD-10-CM

## 2020-08-25 DIAGNOSIS — I10 Essential (primary) hypertension: Secondary | ICD-10-CM | POA: Diagnosis not present

## 2020-08-25 DIAGNOSIS — S8991XA Unspecified injury of right lower leg, initial encounter: Secondary | ICD-10-CM | POA: Diagnosis present

## 2020-08-25 HISTORY — DX: Fracture of unspecified metatarsal bone(s), unspecified foot, initial encounter for closed fracture: S92.309A

## 2020-08-25 NOTE — ED Triage Notes (Signed)
Arrived via EMS; c/o re-current falls. C/o right foot pain.

## 2020-08-26 ENCOUNTER — Emergency Department (HOSPITAL_COMMUNITY): Payer: Medicare Other

## 2020-08-26 DIAGNOSIS — S92351A Displaced fracture of fifth metatarsal bone, right foot, initial encounter for closed fracture: Secondary | ICD-10-CM | POA: Diagnosis not present

## 2020-08-26 DIAGNOSIS — M25561 Pain in right knee: Secondary | ICD-10-CM | POA: Diagnosis not present

## 2020-08-26 DIAGNOSIS — Z041 Encounter for examination and observation following transport accident: Secondary | ICD-10-CM | POA: Diagnosis not present

## 2020-08-26 LAB — POC OCCULT BLOOD, ED: Fecal Occult Bld: NEGATIVE

## 2020-08-26 LAB — URINALYSIS, ROUTINE W REFLEX MICROSCOPIC
Bilirubin Urine: NEGATIVE
Glucose, UA: NEGATIVE mg/dL
Ketones, ur: 5 mg/dL — AB
Nitrite: NEGATIVE
Protein, ur: >=300 mg/dL — AB
Specific Gravity, Urine: 1.011 (ref 1.005–1.030)
WBC, UA: >50 WBC/hpf — ABNORMAL HIGH (ref 0–5)
pH: 5 (ref 5.0–8.0)

## 2020-08-26 LAB — BASIC METABOLIC PANEL
Anion gap: 23 — ABNORMAL HIGH (ref 5–15)
Anion gap: 14 (ref 5–15)
BUN: 32 mg/dL — ABNORMAL HIGH (ref 8–23)
BUN: 33 mg/dL — ABNORMAL HIGH (ref 8–23)
CO2: 15 mmol/L — ABNORMAL LOW (ref 22–32)
CO2: 17 mmol/L — ABNORMAL LOW (ref 22–32)
Calcium: 9.9 mg/dL (ref 8.9–10.3)
Calcium: 9.7 mg/dL (ref 8.9–10.3)
Chloride: 106 mmol/L (ref 98–111)
Chloride: 109 mmol/L (ref 98–111)
Creatinine, Ser: 2.63 mg/dL — ABNORMAL HIGH (ref 0.44–1.00)
Creatinine, Ser: 2.4 mg/dL — ABNORMAL HIGH (ref 0.44–1.00)
GFR calc Af Amer: 19 mL/min — ABNORMAL LOW (ref >60)
GFR calc Af Amer: 22 mL/min — ABNORMAL LOW (ref >60)
GFR calc non Af Amer: 17 mL/min — ABNORMAL LOW (ref >60)
GFR calc non Af Amer: 19 mL/min — ABNORMAL LOW (ref >60)
Glucose, Bld: 63 mg/dL — ABNORMAL LOW (ref 70–99)
Glucose, Bld: 107 mg/dL — ABNORMAL HIGH (ref 70–99)
Potassium: 3.5 mmol/L (ref 3.5–5.1)
Potassium: 3.8 mmol/L (ref 3.5–5.1)
Sodium: 144 mmol/L (ref 135–145)
Sodium: 140 mmol/L (ref 135–145)

## 2020-08-26 LAB — I-STAT VENOUS BLOOD GAS, ED
Acid-base deficit: 6 mmol/L — ABNORMAL HIGH (ref 0.0–2.0)
Bicarbonate: 17.7 mmol/L — ABNORMAL LOW (ref 20.0–28.0)
Calcium, Ion: 1.3 mmol/L (ref 1.15–1.40)
HCT: 25 % — ABNORMAL LOW (ref 36.0–46.0)
Hemoglobin: 8.5 g/dL — ABNORMAL LOW (ref 12.0–15.0)
O2 Saturation: 100 %
Potassium: 3.8 mmol/L (ref 3.5–5.1)
Sodium: 141 mmol/L (ref 135–145)
TCO2: 19 mmol/L — ABNORMAL LOW (ref 22–32)
pCO2, Ven: 27.5 mmHg — ABNORMAL LOW (ref 44.0–60.0)
pH, Ven: 7.418 (ref 7.250–7.430)
pO2, Ven: 164 mmHg — ABNORMAL HIGH (ref 32.0–45.0)

## 2020-08-26 LAB — HEPATIC FUNCTION PANEL
ALT: 16 U/L (ref 0–44)
AST: 11 U/L — ABNORMAL LOW (ref 15–41)
Albumin: 3.4 g/dL — ABNORMAL LOW (ref 3.5–5.0)
Alkaline Phosphatase: 84 U/L (ref 38–126)
Bilirubin, Direct: 0.1 mg/dL (ref 0.0–0.2)
Indirect Bilirubin: 0.7 mg/dL (ref 0.3–0.9)
Total Bilirubin: 0.8 mg/dL (ref 0.3–1.2)
Total Protein: 7.4 g/dL (ref 6.5–8.1)

## 2020-08-26 LAB — CBC
HCT: 28.2 % — ABNORMAL LOW (ref 36.0–46.0)
Hemoglobin: 7.9 g/dL — ABNORMAL LOW (ref 12.0–15.0)
MCH: 20.2 pg — ABNORMAL LOW (ref 26.0–34.0)
MCHC: 28 g/dL — ABNORMAL LOW (ref 30.0–36.0)
MCV: 72.1 fL — ABNORMAL LOW (ref 80.0–100.0)
Platelets: 361 10*3/uL (ref 150–400)
RBC: 3.91 MIL/uL (ref 3.87–5.11)
RDW: 20.1 % — ABNORMAL HIGH (ref 11.5–15.5)
WBC: 8 10*3/uL (ref 4.0–10.5)
nRBC: 0 % (ref 0.0–0.2)

## 2020-08-26 LAB — CBG MONITORING, ED: Glucose-Capillary: 99 mg/dL (ref 70–99)

## 2020-08-26 LAB — PROTIME-INR
INR: 1.2 (ref 0.8–1.2)
Prothrombin Time: 14.5 seconds (ref 11.4–15.2)

## 2020-08-26 MED ORDER — SODIUM CHLORIDE 0.9 % IV SOLN
1.0000 g | Freq: Once | INTRAVENOUS | Status: AC
  Administered 2020-08-26: 1 g via INTRAVENOUS
  Filled 2020-08-26: qty 10

## 2020-08-26 MED ORDER — CEFTRIAXONE SODIUM 1 G IJ SOLR: 1.0000 g | Freq: Once | INTRAMUSCULAR | Status: DC

## 2020-08-26 MED ORDER — CEPHALEXIN 500 MG PO CAPS: 500.0000 mg | ORAL_CAPSULE | Freq: Four times a day (QID) | ORAL | 0 refills | Status: DC

## 2020-08-26 MED ORDER — LACTATED RINGERS IV BOLUS
1000.0000 mL | Freq: Once | INTRAVENOUS | Status: AC
  Administered 2020-08-26: 1000 mL via INTRAVENOUS

## 2020-08-26 NOTE — ED Notes (Signed)
Ortho tech at bedside 

## 2020-08-26 NOTE — ED Notes (Signed)
THE PTFELL SOMETIME YESTERDAY INJURING HER RT KNEE,  MAYBE SL SWELLING GOOD PEDAL PULSE

## 2020-08-26 NOTE — ED Notes (Signed)
Ortho tech paged  

## 2020-08-26 NOTE — ED Provider Notes (Signed)
[facility] EMERGENCY DEPARTMENT Provider Note   CSN: [id number] Arrival date & time: 08/25/20  1535     History Chief Complaint  Patient presents with  . Fall    Megan Stevenson is a 79 y.o. female with a history of T2DM, hypertension, CKD, and anemia who presents to the ED for evaluation of fall with RLE injury. Patient states she is unsure what happened but that her R foot & knee are in pain. She does not recall passing out or falling. She states she is quite tired and is unsure what prompted her ED visit other than her pain. She is alert & oriented to person, place, & time. She denies any other areas of pain. Denies fever, chills, chest pain, dyspnea, dizziness, abdominal pain, vomiting, or diarrhea. I attempted to call patient's son for additional information with no answer.   HPI     Past Medical History:  Diagnosis Date  . Anemia   . Arthritis   . Diabetes mellitus without complication (HCC)    no medications  . Dysrhythmia   . Hypertension   . Macular hole    OD  . Renal disorder    Sees someone [facility]  . Wears glasses     Patient Active Problem List   Diagnosis Date Noted  . 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 Bradycardia, sinus 06/13/2018  . Hypertension 06/13/2018  . Diet-controlled diabetes mellitus ([place]) 06/13/2018  . CKD (chronic kidney disease) stage 4, GFR 15-29 ml/min (HCC) 06/13/2018  . Anemia 06/13/2018  . Heart murmur 06/13/2018  . Bradycardia 06/13/2018  . 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 DIABETES MELLITUS, TYPE II 07/24/2007  . DYSLIPIDEMIA 07/24/2007  . 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  . ANEMIA-IRON DEFICIENCY 07/24/2007  . HYPERTENSION 07/24/2007  . PUD 07/24/2007  . 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 RENAL INSUFFICIENCY 07/24/2007  . ROTATOR CUFF SYNDROME, RIGHT  07/24/2007  . SUBACROMIAL BURSITIS, 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  . HEART MURMUR, HX OF 07/24/2007  . COLONIC POLYPS, HX OF 07/24/2007    Past Surgical History:  Procedure Laterality Date  . [place] VITRECTOMY WITH 20 GAUGE MVR PORT FOR MACULAR HOLE Right 06/14/2014   Procedure: 25 GAUGE PARS PLANA VITRECTOMY WITH 20 GAUGE MVR PORT FOR MACULAR HOLE; serum patch; gas injection; laser treament; C3F8 gas fluid exchange.;  Surgeon: Hayden Pedro, MD;  Location: [place];  Service: Ophthalmology;  Laterality: Right;  . ABDOMINAL HYSTERECTOMY    . EYE SURGERY    . GANGLION CYST EXCISION Left 02/02/2015   Procedure: EXCISION OF VOLAR GANGLION CYST LEFT WRIST;  Surgeon: Leanora Cover, MD;  Location: [facility];  Service: Orthopedics;  Laterality: Left;  [place] GAS INSERTION Right 07/19/2014   Procedure: INSERTION OF GAS;  Surgeon: Hayden Pedro, MD;  Location: [place];  Service: Ophthalmology;  Laterality: Right;  C3F8  . LASER PHOTO ABLATION Right 07/19/2014   Procedure: LASER PHOTO ABLATION;  Surgeon: Hayden Pedro, MD;  Location: [place];  Service: Ophthalmology;  Laterality: Right;  . PARS PLANA VITRECTOMY W/ REPAIR OF MACULAR HOLE Right 06/14/2014   DR MATTHEWS  . SCLERAL BUCKLE Right 07/19/2014   Procedure: SCLERAL BUCKLE;  Surgeon: Hayden Pedro, MD;  Location: [facility];  Service: Ophthalmology;  Laterality: Right;     OB History   No obstetric history on file.  No family history on file.  Social History   Tobacco Use  . Smoking status: Former Smoker    Packs/day: 0.25    Years: 3.00    Pack years: 0.75    Types: Cigarettes    Quit date: 12/09/1993    Years since quitting: 26.7  . Smokeless tobacco: Never Used  Substance Use Topics  . Alcohol use: No  . Drug use: No    Home Medications Prior to Admission medications   Medication Sig Start Date End Date Taking? Authorizing Provider  cloNIDine (CATAPRES - DOSED IN MG/24 HR) 0.2 mg/24hr patch Place 1 patch (0.2 mg total)  onto the skin once a week. 06/14/18   Elgergawy, Silver Huguenin, MD  traMADol (ULTRAM) 50 MG tablet Take 1 tablet (50 mg total) by mouth every 6 (six) hours as needed. 08/21/20   Tegeler, Gwenyth Allegra, MD    Allergies    Nsaids, Benazepril hcl, Furosemide, and Atenolol  Review of Systems   Review of Systems  Constitutional: Negative for chills and fever.  Respiratory: Negative for shortness of breath.   Cardiovascular: Negative for chest pain.  Gastrointestinal: Negative for abdominal pain and vomiting.  Musculoskeletal: Positive for arthralgias and myalgias. Negative for back pain and neck pain.  Skin: Negative for wound.  Neurological: Negative for dizziness, seizures, syncope and headaches.  All other systems reviewed and are negative.   Physical Exam Updated Vital Signs BP (!) 156/88   Pulse 78   Temp 98.3 F (36.8 C) (Oral)   Resp 15   SpO2 98%   Physical Exam Vitals and nursing note reviewed.  Constitutional:      General: She is not in acute distress.    Appearance: She is not ill-appearing or toxic-appearing.  HENT:     Head: Normocephalic and atraumatic.  Eyes:     Extraocular Movements: Extraocular movements intact.     Pupils: Pupils are equal, round, and reactive to light.  Neck:     Comments: ROM intact. No midline tenderness.  Cardiovascular:     Rate and Rhythm: Normal rate and regular rhythm.     Pulses:          Dorsalis pedis pulses are 2+ on the right side and 2+ on the left side.       Posterior tibial pulses are 2+ on the right side and 2+ on the left side.  Pulmonary:     Effort: Pulmonary effort is normal.     Breath sounds: Normal breath sounds.  Abdominal:     General: There is no distension.     Palpations: Abdomen is soft.     Tenderness: There is no abdominal tenderness. There is no guarding or rebound.  Musculoskeletal:     Cervical back: Neck supple.     Comments: Upper extremities: Intact AROM. No focal bony tenderness.  Back: No midline  tenderness.  Lower extremities: Soft tissue swelling to dorsum of the foot and ankle of the RLE. Intact AROM throughout. Tender to mid & forefoot and to the anterior knee of the RLE. Otherwise nontender.   Skin:    General: Skin is warm and dry.     Capillary Refill: Capillary refill takes less than 2 seconds.  Neurological:     Mental Status: She is alert.     Comments: Alert. Clear speech. CN III-XII Grossly intact. Sensation grossly intact to bilateral upper & lower extremities. 5/5 strength with grip strength & plantar/dorsiflexion bilaterally.   Psychiatric:  Mood and Affect: Mood normal.        Behavior: Behavior normal.     ED Results / Procedures / Treatments   Labs (all labs ordered are listed, but only abnormal results are displayed) Labs Reviewed  CBC  BASIC METABOLIC PANEL  URINALYSIS, ROUTINE W REFLEX MICROSCOPIC    EKG None  Radiology DG Ankle 2 Views Right  Result Date: 08/25/2020 CLINICAL DATA:  Pain after fall EXAM: RIGHT ANKLE - 2 VIEW COMPARISON:  None. FINDINGS: There is no evidence of fracture, dislocation, or joint effusion. There is no evidence of arthropathy or other focal bone abnormality. Soft tissues are unremarkable. IMPRESSION: Negative. Electronically Signed   By: Dorise Bullion III M.D   On: 08/25/2020 17:53   CT Head Wo Contrast  Result Date: 08/26/2020 CLINICAL DATA:  Recurrent fall EXAM: CT HEAD WITHOUT CONTRAST TECHNIQUE: Contiguous axial images were obtained from the base of the skull through the vertex without intravenous contrast. COMPARISON:  06/13/2018 FINDINGS: Brain: There is no mass, hemorrhage or extra-axial collection. The size and configuration of the ventricles and extra-axial CSF spaces are normal. There is hypoattenuation of the white matter, most commonly indicating chronic small vessel disease. Vascular: No abnormal hyperdensity of the major intracranial arteries or dural venous sinuses. No intracranial atherosclerosis. Skull:  The visualized skull base, calvarium and extracranial soft tissues are normal. Sinuses/Orbits: No fluid levels or advanced mucosal thickening of the visualized paranasal sinuses. No mastoid or middle ear effusion. The orbits are normal. IMPRESSION: Chronic small vessel disease without acute intracranial abnormality. Electronically Signed   By: Ulyses Jarred M.D.   On: 08/26/2020 05:43   DG Knee Complete 4 Views Right  Result Date: 08/26/2020 CLINICAL DATA:  Right knee pain. EXAM: RIGHT KNEE - COMPLETE 4+ VIEW COMPARISON:  08/21/2020 FINDINGS: There is a moderate to large suprapatellar joint effusion severe lateral compartment joint space narrowing and marginal spur formation. Mild medial and patellofemoral compartment arthropathy also noted. Sharpening the tibial spines. Small loose body is identified within the posterior aspect of the medial compartment. No acute fracture or dislocation identified. IMPRESSION: 1. Moderate to large suprapatellar joint effusion. 2. 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 Electronically Signed   By: Kerby Moors M.D.   On: 08/26/2020 06:34   DG Foot Complete Right  Result Date: 08/25/2020 CLINICAL DATA:  Right foot pain radiating to ankle. EXAM: RIGHT FOOT COMPLETE - 3+ VIEW COMPARISON:  None. FINDINGS: No toe fractures identified. Subtle cortical step-off in the region of the distal fifth metatarsal seen on AP imaging. Unusual contour to the distal second and third metatarsals. No Lisfranc injuries or fractures. The proximal metatarsals are normal. Soft tissue swelling over the top of the foot at the level the distal metatarsals. IMPRESSION: 1. Suggested subtle fracture of the distal fifth metatarsal with a subtle cortical step-off. Recommend clinical correlation for pain in this region. 2. Unusual configuration to the distal second and third metatarsals are concerning for fractures as well. Recommend clinical correlation for point tenderness in these  regions. There is overlying soft tissue swelling based on the lateral view. 3. No other abnormalities. Electronically Signed   By: Dorise Bullion III M.D   On: 08/25/2020 17:53    Procedures Procedures (including critical care time)  Medications Ordered in ED Medications - No data to display  ED Course  I have reviewed the triage vital signs and the nursing notes.  Pertinent labs & imaging results that were available during my care of the  patient were reviewed by me and considered in my medical decision making (see chart for details).  Clinical Course as of Aug 26 702  Sat Aug 26, 2020  2197 CC: fall. Patient amnesic to event. Only reporting foot ankle pain. Patient in lobby for 15 hours.  Fully alert oriented x 4. +Metatarsal fractures, boot/crutches. Unable to call son x 2 no pick up. Lives with son and ex husband.   Plan to attempt ambulation with crutches, f/u labs and UA. If unable then SW/PT evaluation.    [CG]    Clinical Course User Index [CG] Kinnie Feil, PA-C   MDM Rules/Calculators/A&P                         Patient presents to the ED with complaints of right foot/knee pain.  Nontoxic, vitals without significant abnormality.  Report to triage was that patient had a fall, but patient does not recall this or recall what lead to her ED visit other than her pain. She is otherwise oriented to person, place, and time, Unable to reach family for collateral information.   Additional history obtained:  Additional history obtained from chart review & nursing note review.  EKG: No significant arrhythmia.  Lab Tests:  CBC, BMP, & UA ordered.   Imaging Studies ordered:  R foot & ankle x-rays ordered per triage, I additionally ordered CT had & R Knee pain, I independently visualized and interpreted imaging which showed findings of subtle fractures of the distal fifth metatarsal and unusual configuration to the distal second and third metatarsals concerning for fx as  well--> tender throughout this region.   Will place in cam walker and provide crutches with goal of patient being non-weigthbearing on metatarsal fractures. Labs pending.   07:00: Patient care signed out to Megan Sails PA-C at change of shift pending labs & disposition. Likely okay for discharge if labs not significantly abnormal, patient able to ambulate with non weight bearing status, and family can safely take home.   Findings and plan of care discussed with supervising physician Dr. Betsey Holiday who is in agreement.   Portions of this note were generated with Lobbyist. Dictation errors may occur despite best attempts at proofreading.  Final Clinical Impression(s) / ED Diagnoses Final diagnoses:  None    Rx / DC Orders ED Discharge Orders    None       Amaryllis Dyke, PA-C 08/26/20 5883    Orpah Greek, MD 09/01/20 [phone]

## 2020-08-26 NOTE — TOC Initial Note (Signed)
Transition of Care [facility]) - Initial/Assessment Note    Patient Details  Name: Megan Stevenson MRN: [id number] Date of Birth: 1941-10-29  [facility]) CM/SW Contact:    Verdell Carmine, RN Phone Number: 08/26/2020, 10:24 AM  Clinical Narrative:                 Discussed with Shary Key PA. Spoke to patient regarding Home Health and DME. Patient agrees to rollator and Home Health, chooses [facility] as she had them previously.DME ordered, [facility] accepts patient. Can DC from CM standpoint when rollator is delivered.      Barriers to Discharge: No Barriers Identified   Patient Goals and CMS Choice        Expected Discharge Plan and [place] with home health                         DME Arranged: Walker rolling with seat DME Agency: [employer] Date DME Agency Contacted: 08/26/20 Time DME Agency Contacted: [phone] Representative spoke with at DME Agency: Freda Munro HH Arranged: RN, PT [facility] Agency: [place] ([place]) Date [place]: 08/26/20 Time [place]: 68 Representative spoke with at [place]: [place] Arrangements/Services     Patient language and need for interpreter reviewed:: Yes        Need for Family Participation in Patient Care: Yes (Comment) Care giver support system in place?: Yes (comment)   Criminal Activity/Legal Involvement Pertinent to Current Situation/Hospitalization: No - Comment as needed  Activities of Daily Living      Permission Sought/Granted                  Emotional Assessment       Orientation: : Oriented to Self, Oriented to Place, Oriented to  Time, Oriented to Situation Alcohol / Substance Use: Not Applicable Psych Involvement: No (comment)  Admission diagnosis:  knee pain  Patient Active Problem List   Diagnosis Date Noted  . 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 Bradycardia, sinus 06/13/2018  . Hypertension 06/13/2018  . Diet-controlled diabetes mellitus ([place])  06/13/2018  . CKD (chronic kidney disease) stage 4, GFR 15-29 ml/min (HCC) 06/13/2018  . Anemia 06/13/2018  . Heart murmur 06/13/2018  . Bradycardia 06/13/2018  . 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 DIABETES MELLITUS, TYPE II 07/24/2007  . DYSLIPIDEMIA 07/24/2007  . 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  . ANEMIA-IRON DEFICIENCY 07/24/2007  . HYPERTENSION 07/24/2007  . PUD 07/24/2007  . 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 RENAL INSUFFICIENCY 07/24/2007  . ROTATOR CUFF SYNDROME, 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  . SUBACROMIAL BURSITIS, 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  . HEART MURMUR, HX OF 07/24/2007  . COLONIC POLYPS, HX OF 07/24/2007   PCP:  Velna Hatchet, MD Pharmacy:   [facility] [place], [address] AT [address] [place] [address] Phone: [phone] Fax: [phone]     Social Determinants of Health (SDOH) Interventions    Readmission Risk Interventions No flowsheet data found.

## 2020-08-26 NOTE — ED Notes (Signed)
Pt sister called and updated on pt diagnosis and d/c instructions. She verbalized understanding and states she will be here to pick pt up for discharge around 12:30. Sister provided with this RN's direct number and instructed to call when she arrives so we can wheel her out. AVS also reviewed with pt who verbalized understanding.

## 2020-08-26 NOTE — ED Notes (Signed)
Pt ambulatory to bathroom with CAM boot to right foot and walker.

## 2020-08-26 NOTE — ED Provider Notes (Signed)
Physical Exam  BP 140/61   Pulse 72   Temp 98.4 F (36.9 C) (Temporal)   Resp 18   SpO2 96%   Physical Exam Constitutional:      Appearance: She is well-developed.  HENT:     Head: Normocephalic.     Nose: Nose normal.  Eyes:     General: Lids are normal.  Cardiovascular:     Rate and Rhythm: Normal rate.  Pulmonary:     Effort: Pulmonary effort is normal. No respiratory distress.  Genitourinary:    Rectum: Guaiac result negative.     Comments: Perianal skin normal.  Stool is brown. Musculoskeletal:        General: Normal range of motion.     Cervical back: Normal range of motion.  Neurological:     Mental Status: She is alert.  Psychiatric:        Behavior: Behavior normal.     ED Course/Procedures   Clinical Course as of Aug 26 1138  Sat Aug 26, 2020  0655 CC: fall. Patient amnesic to event. Only reporting right knee and foot ankle pain. Patient in lobby for 15 hours.  Fully alert oriented x 4. +Metatarsal fractures, plan for NWB with boot/crutches. Unable to call son x 2 no pick up. Lives with son and ex husband.   Plan to attempt ambulation with crutches, f/u labs and UA. If unable then SW/PT evaluation.    [CG]  0720 Hemoglobin(!): 7.9 [CG]  0720 Fecal Occult Blood, POC: NEGATIVE [CG]  0725 Baseline, slightly worse   Creatinine(!): 2.63 [CG]  0807 GFR, Est African American(!): 19 [CG]  0807 Anion gap(!): 23 [CG]  0807 Glucose(!): 63 [CG]  0807 CO2(!): 15 [CG]  0809 BUN(!): 32 [CG]  0936 Leukocytes,Ua(!): LARGE [CG]  0936 RBC / HPF: 6-10 [CG]  0936 WBC, UA(!): >50 [CG]  0936 Bacteria, UA(!): MANY [CG]  0936 Squamous Epithelial / LPF: 0-5 [CG]  1123 Creatinine(!): 2.40 [CG]  1123 Anion gap: 14 [CG]  1123 GFR, Est African American(!): 22 [CG]  1123 Glucose(!): 107 [CG]  1123 BUN(!): 33 [CG]  1123 CO2(!): 17 [CG]  1125 IMPRESSION: 1. Moderate to large suprapatellar joint effusion. 2. 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    DG  Knee Complete 4 Views Right [CG]    Clinical Course User Index [CG] Kinnie Feil, PA-C    Procedures  MDM   0700: Patient signed out to me at shift change.  See previous note and above for full details.  I evaluated patient.  Pleasant, in no distress.  She does not have any recollection to the fall, does not remember where she was.  Otherwise tells me she has been feeling well without recent illnesses.  She lives with her son.  She does not take any medicines.  Reports right knee and right foot pain but denies any other pain or issues.  Imaging personally visualized and interpreted shows metatarsal fractures on the right foot.  Plan to treat with cam walker, crutches and ideally nonweightbearing.  Will attempt to ambulate patient here to make sure she is steady.  Lab work and urinalysis pending at shift change.  0720: Hemoglobin 7.9, last around 11.  This was 2 years ago.  Patient tells me she has been told she is anemic.  She does not take any supplements for this.  Denies any black stools, history of GI bleed, frequent use of NSAIDs, ulcers.  Denies feeling exertional chest pain, shortness of breath, lightheadedness  the last few days or weeks.  Hemoccult is negative.  Pending remaining labs.  1130: Patient given 1L LR bolus.  Repeat BMP with improvement in creatinine, CO2.  Resolution of anion gap.  VBG with normal pH.  Suspect lab findings secondary to dehydration, CKD.  Has normal vital signs.  CM involved in patient care.  Patient received a walker in the ED. I personally ambulated patient with a walker here and she was very steady.  She is motivated to be discharged home.  Think at this time we can avoid a hospitalization.  I have ordered home RN and PT to come to patient's home to evaluate her.  Patient given instructions to follow-up with orthopedics for orthopedic injuries.   She is aware that her hemoglobin is low and she needs to follow-up with PCP for this.  Discharged  with Tylenol for pain and Keflex for UTI.  Return precautions in her AVS.  Unable to contact family to relay information.  Discussed with EDP.     Kinnie Feil, PA-C 08/26/20 [place], [place], DO 08/26/20 1456

## 2020-08-26 NOTE — ED Notes (Signed)
Patient provided with additional apple juice.

## 2020-08-26 NOTE — Discharge Instructions (Signed)
You were seen in the ER after a fall  You have a urinary tract infection.  Take Keflex for this.  Return to the ER for fever, flank or back pain.  Your hemoglobin was low today (7.9). however there were no signs of bleeding from your GI tract.  Please follow up with your primary care doctor as soon as possible for re-evaluation and further testing of this  You have fluid inside your right knee this is likely from your fall.  You have several fractures across your toes/midfoot on the right.  We will treat this with a cam walker.  Ideally you want to minimize weightbearing on the right foot.  Use your cam walker at all times when standing up, you can take this off if you are laying down or not walking.  We gave you a walker here that you should use for stabilization and to avoid putting too much weight on your right foot.  Be very careful moving with a walker to avoid falls.  Please follow-up with orthopedic doctor and Dr. Griffin Basil in the next week or so for reevaluation of your injuries and any other further outpatient treatment.  Elevate your foot. Alternate acetaminophen every 6-8 hours for pain.   We have placed an order for nurse and physical therapist to come to your house for an evaluation to help assist you with your injuries. Please contact your primary care doctor for any further needs.

## 2020-08-26 NOTE — Progress Notes (Signed)
Orthopedic Tech Progress Note Patient Details:  Megan Stevenson 10/10/1941 [id number]  Ortho Devices Type of Ortho Device: CAM walker, Crutches Ortho Device/Splint Location: LRE Ortho Device/Splint Interventions: Application, Ordered   Post Interventions Patient Tolerated: Well Instructions Provided: Adjustment of device   Megan Stevenson 08/26/2020, 9:27 AM

## 2020-08-26 NOTE — ED Notes (Signed)
Pt provided with 2- apple juices and 3 packs of graham crackers. She is independently feeding self at this time.

## 2020-08-27 DIAGNOSIS — E119 Type 2 diabetes mellitus without complications: Secondary | ICD-10-CM | POA: Diagnosis not present

## 2020-08-27 DIAGNOSIS — E785 Hyperlipidemia, unspecified: Secondary | ICD-10-CM | POA: Diagnosis not present

## 2020-08-27 DIAGNOSIS — Z86010 Personal history of colonic polyps: Secondary | ICD-10-CM | POA: Diagnosis not present

## 2020-08-27 DIAGNOSIS — M1909 Primary osteoarthritis, other specified site: Secondary | ICD-10-CM | POA: Diagnosis not present

## 2020-08-27 DIAGNOSIS — H9319 Tinnitus, unspecified ear: Secondary | ICD-10-CM | POA: Diagnosis not present

## 2020-08-27 DIAGNOSIS — Z9181 History of falling: Secondary | ICD-10-CM | POA: Diagnosis not present

## 2020-08-27 DIAGNOSIS — E1122 Type 2 diabetes mellitus with diabetic chronic kidney disease: Secondary | ICD-10-CM | POA: Diagnosis not present

## 2020-08-27 DIAGNOSIS — R011 Cardiac murmur, unspecified: Secondary | ICD-10-CM | POA: Diagnosis not present

## 2020-08-27 DIAGNOSIS — K279 Peptic ulcer, site unspecified, unspecified as acute or chronic, without hemorrhage or perforation: Secondary | ICD-10-CM | POA: Diagnosis not present

## 2020-08-27 DIAGNOSIS — Z90710 Acquired absence of both cervix and uterus: Secondary | ICD-10-CM | POA: Diagnosis not present

## 2020-08-27 DIAGNOSIS — W19XXXD Unspecified fall, subsequent encounter: Secondary | ICD-10-CM | POA: Diagnosis not present

## 2020-08-27 DIAGNOSIS — Z993 Dependence on wheelchair: Secondary | ICD-10-CM | POA: Diagnosis not present

## 2020-08-27 DIAGNOSIS — M7551 Bursitis of right shoulder: Secondary | ICD-10-CM | POA: Diagnosis not present

## 2020-08-27 DIAGNOSIS — I129 Hypertensive chronic kidney disease with stage 1 through stage 4 chronic kidney disease, or unspecified chronic kidney disease: Secondary | ICD-10-CM | POA: Diagnosis not present

## 2020-08-27 DIAGNOSIS — Z79891 Long term (current) use of opiate analgesic: Secondary | ICD-10-CM | POA: Diagnosis not present

## 2020-08-27 DIAGNOSIS — K5730 Diverticulosis of large intestine without perforation or abscess without bleeding: Secondary | ICD-10-CM | POA: Diagnosis not present

## 2020-08-27 DIAGNOSIS — D509 Iron deficiency anemia, unspecified: Secondary | ICD-10-CM | POA: Diagnosis not present

## 2020-08-27 DIAGNOSIS — M109 Gout, unspecified: Secondary | ICD-10-CM | POA: Diagnosis not present

## 2020-08-27 DIAGNOSIS — M75101 Unspecified rotator cuff tear or rupture of right shoulder, not specified as traumatic: Secondary | ICD-10-CM | POA: Diagnosis not present

## 2020-08-27 DIAGNOSIS — Z87891 Personal history of nicotine dependence: Secondary | ICD-10-CM | POA: Diagnosis not present

## 2020-08-27 DIAGNOSIS — S92352D Displaced fracture of fifth metatarsal bone, left foot, subsequent encounter for fracture with routine healing: Secondary | ICD-10-CM | POA: Diagnosis not present

## 2020-08-27 DIAGNOSIS — N184 Chronic kidney disease, stage 4 (severe): Secondary | ICD-10-CM | POA: Diagnosis not present

## 2020-08-27 DIAGNOSIS — R001 Bradycardia, unspecified: Secondary | ICD-10-CM | POA: Diagnosis not present

## 2020-08-28 DIAGNOSIS — M109 Gout, unspecified: Secondary | ICD-10-CM | POA: Diagnosis not present

## 2020-08-28 DIAGNOSIS — M7551 Bursitis of right shoulder: Secondary | ICD-10-CM | POA: Diagnosis not present

## 2020-08-28 DIAGNOSIS — E1122 Type 2 diabetes mellitus with diabetic chronic kidney disease: Secondary | ICD-10-CM | POA: Diagnosis not present

## 2020-08-28 DIAGNOSIS — S92352D Displaced fracture of fifth metatarsal bone, left foot, subsequent encounter for fracture with routine healing: Secondary | ICD-10-CM | POA: Diagnosis not present

## 2020-08-28 DIAGNOSIS — M75101 Unspecified rotator cuff tear or rupture of right shoulder, not specified as traumatic: Secondary | ICD-10-CM | POA: Diagnosis not present

## 2020-08-28 DIAGNOSIS — M1909 Primary osteoarthritis, other specified site: Secondary | ICD-10-CM | POA: Diagnosis not present

## 2020-08-29 ENCOUNTER — Other Ambulatory Visit: Payer: Self-pay

## 2020-08-29 DIAGNOSIS — M109 Gout, unspecified: Secondary | ICD-10-CM | POA: Diagnosis not present

## 2020-08-29 DIAGNOSIS — S92352D Displaced fracture of fifth metatarsal bone, left foot, subsequent encounter for fracture with routine healing: Secondary | ICD-10-CM | POA: Diagnosis not present

## 2020-08-29 DIAGNOSIS — M1909 Primary osteoarthritis, other specified site: Secondary | ICD-10-CM | POA: Diagnosis not present

## 2020-08-29 DIAGNOSIS — M7551 Bursitis of right shoulder: Secondary | ICD-10-CM | POA: Diagnosis not present

## 2020-08-29 DIAGNOSIS — M75101 Unspecified rotator cuff tear or rupture of right shoulder, not specified as traumatic: Secondary | ICD-10-CM | POA: Diagnosis not present

## 2020-08-29 DIAGNOSIS — E1122 Type 2 diabetes mellitus with diabetic chronic kidney disease: Secondary | ICD-10-CM | POA: Diagnosis not present

## 2020-08-29 NOTE — Patient Outreach (Signed)
[place] [facility]) Care Management  08/29/2020  Megan Stevenson [birth year] [id number]   New referral for assessment of needs:   Reviewed medical record. Recent fall. Referral from MD for assessment of needs for social worker.  Placed call to patient with no answer. No machine.  PLAN: will mail unsuccessful outreach letter and attempt again in 3-4 days.  Tomasa Rand, RN, BSN, CEN [facility] [employer] [phone]

## 2020-08-30 DIAGNOSIS — S92352D Displaced fracture of fifth metatarsal bone, left foot, subsequent encounter for fracture with routine healing: Secondary | ICD-10-CM | POA: Diagnosis not present

## 2020-08-30 DIAGNOSIS — M109 Gout, unspecified: Secondary | ICD-10-CM | POA: Diagnosis not present

## 2020-08-30 DIAGNOSIS — M1909 Primary osteoarthritis, other specified site: Secondary | ICD-10-CM | POA: Diagnosis not present

## 2020-08-30 DIAGNOSIS — E1122 Type 2 diabetes mellitus with diabetic chronic kidney disease: Secondary | ICD-10-CM | POA: Diagnosis not present

## 2020-08-30 DIAGNOSIS — M75101 Unspecified rotator cuff tear or rupture of right shoulder, not specified as traumatic: Secondary | ICD-10-CM | POA: Diagnosis not present

## 2020-08-30 DIAGNOSIS — M7551 Bursitis of right shoulder: Secondary | ICD-10-CM | POA: Diagnosis not present

## 2020-08-31 ENCOUNTER — Telehealth: Payer: Self-pay

## 2020-08-31 NOTE — Telephone Encounter (Signed)
Palliative care home visit scheduled for Fri 09-08-2020 @0900 , with patients sister, [name]. Sister suggested to outreach patients son to make aware as well. SW outreached son - number is a nonworking number. SW called patients home, no answer phone continuously rang wth no answering machine pick up.

## 2020-09-01 ENCOUNTER — Other Ambulatory Visit: Payer: Self-pay

## 2020-09-01 NOTE — Patient Outreach (Signed)
[facility]) Care Management  09/01/2020  MONTY MCCARRELL 02-23-1941 [id number]    Telephone assessment:  Placed call to patient for assessment of needs. No answer and no machine. Noted in Epic patient is to have a hospice assessment.  PLAN:  Will attempt telephone outreach again in 3 days.  Tomasa Rand, RN, BSN, CEN [facility] [employer] [phone]

## 2020-09-02 DIAGNOSIS — M1909 Primary osteoarthritis, other specified site: Secondary | ICD-10-CM | POA: Diagnosis not present

## 2020-09-02 DIAGNOSIS — M7551 Bursitis of right shoulder: Secondary | ICD-10-CM | POA: Diagnosis not present

## 2020-09-02 DIAGNOSIS — E1122 Type 2 diabetes mellitus with diabetic chronic kidney disease: Secondary | ICD-10-CM | POA: Diagnosis not present

## 2020-09-02 DIAGNOSIS — M109 Gout, unspecified: Secondary | ICD-10-CM | POA: Diagnosis not present

## 2020-09-02 DIAGNOSIS — M75101 Unspecified rotator cuff tear or rupture of right shoulder, not specified as traumatic: Secondary | ICD-10-CM | POA: Diagnosis not present

## 2020-09-02 DIAGNOSIS — S92352D Displaced fracture of fifth metatarsal bone, left foot, subsequent encounter for fracture with routine healing: Secondary | ICD-10-CM | POA: Diagnosis not present

## 2020-09-05 DIAGNOSIS — M75101 Unspecified rotator cuff tear or rupture of right shoulder, not specified as traumatic: Secondary | ICD-10-CM | POA: Diagnosis not present

## 2020-09-05 DIAGNOSIS — M109 Gout, unspecified: Secondary | ICD-10-CM | POA: Diagnosis not present

## 2020-09-05 DIAGNOSIS — E1122 Type 2 diabetes mellitus with diabetic chronic kidney disease: Secondary | ICD-10-CM | POA: Diagnosis not present

## 2020-09-05 DIAGNOSIS — M7551 Bursitis of right shoulder: Secondary | ICD-10-CM | POA: Diagnosis not present

## 2020-09-05 DIAGNOSIS — S92352D Displaced fracture of fifth metatarsal bone, left foot, subsequent encounter for fracture with routine healing: Secondary | ICD-10-CM | POA: Diagnosis not present

## 2020-09-05 DIAGNOSIS — M1909 Primary osteoarthritis, other specified site: Secondary | ICD-10-CM | POA: Diagnosis not present

## 2020-09-06 ENCOUNTER — Other Ambulatory Visit: Payer: Self-pay

## 2020-09-06 NOTE — Patient Outreach (Signed)
[facility] [facility]) Care Management  09/06/2020  MIKAILAH MOREL 09/19/1941 [id number]    Telephone assessment:  3rd outreach attempt to patient that was unsuccessful.  PLAN: letter mail, 3 attempts completed. Will place patient back on schedule and attempt again in 4 weeks.  Tomasa Rand, RN, BSN, CEN [facility] [employer] [phone]

## 2020-09-07 DIAGNOSIS — E1122 Type 2 diabetes mellitus with diabetic chronic kidney disease: Secondary | ICD-10-CM | POA: Diagnosis not present

## 2020-09-07 DIAGNOSIS — M109 Gout, unspecified: Secondary | ICD-10-CM | POA: Diagnosis not present

## 2020-09-07 DIAGNOSIS — M1711 Unilateral primary osteoarthritis, right knee: Secondary | ICD-10-CM | POA: Diagnosis not present

## 2020-09-07 DIAGNOSIS — S92352D Displaced fracture of fifth metatarsal bone, left foot, subsequent encounter for fracture with routine healing: Secondary | ICD-10-CM | POA: Diagnosis not present

## 2020-09-07 DIAGNOSIS — M25571 Pain in right ankle and joints of right foot: Secondary | ICD-10-CM | POA: Diagnosis not present

## 2020-09-07 DIAGNOSIS — M75101 Unspecified rotator cuff tear or rupture of right shoulder, not specified as traumatic: Secondary | ICD-10-CM | POA: Diagnosis not present

## 2020-09-07 DIAGNOSIS — M1909 Primary osteoarthritis, other specified site: Secondary | ICD-10-CM | POA: Diagnosis not present

## 2020-09-07 DIAGNOSIS — M7551 Bursitis of right shoulder: Secondary | ICD-10-CM | POA: Diagnosis not present

## 2020-09-08 ENCOUNTER — Other Ambulatory Visit: Payer: Medicare Other

## 2020-09-08 ENCOUNTER — Other Ambulatory Visit: Payer: Self-pay

## 2020-09-08 DIAGNOSIS — S92352D Displaced fracture of fifth metatarsal bone, left foot, subsequent encounter for fracture with routine healing: Secondary | ICD-10-CM | POA: Diagnosis not present

## 2020-09-08 DIAGNOSIS — M7551 Bursitis of right shoulder: Secondary | ICD-10-CM | POA: Diagnosis not present

## 2020-09-08 DIAGNOSIS — M109 Gout, unspecified: Secondary | ICD-10-CM | POA: Diagnosis not present

## 2020-09-08 DIAGNOSIS — E1122 Type 2 diabetes mellitus with diabetic chronic kidney disease: Secondary | ICD-10-CM | POA: Diagnosis not present

## 2020-09-08 DIAGNOSIS — M75101 Unspecified rotator cuff tear or rupture of right shoulder, not specified as traumatic: Secondary | ICD-10-CM | POA: Diagnosis not present

## 2020-09-08 DIAGNOSIS — M1909 Primary osteoarthritis, other specified site: Secondary | ICD-10-CM | POA: Diagnosis not present

## 2020-09-08 DIAGNOSIS — Z515 Encounter for palliative care: Secondary | ICD-10-CM

## 2020-09-08 NOTE — Progress Notes (Addendum)
COMMUNITY PALLIATIVE CARE SW NOTE  PATIENT NAME: Megan Stevenson DOB: [birth year] MRN: [id number]  PRIMARY CARE PROVIDER: Velna Hatchet, MD  RESPONSIBLE PARTY:  Acct ID - Guarantor Home Phone Work Phone Relationship Acct Type  [id number] - Mikels,[id number]  Self P/F     [address], [place], [place] [address]     PLAN OF CARE and INTERVENTIONS:             GOALS OF CARE/ ADVANCE CARE PLANNING:  Patient is a Full code. Patient's goal is to remain at home as independent as possible. 2. SOCIAL/EMOTIONAL/SPIRITUAL ASSESSMENT/ INTERVENTIONS:  SW and RN Almyra Free met with patient. Patient lives in a one story home with son and ex-husband. Patient had recent hospitalization d/t falls. Patient updated SW and RN medical condition and changes. Patient shared that she fell initially coming down her hallway, and just lost her balance. No pain reported. Patient has tramadol for pain if needed, patient reports not taking any. Patient states that she eats well, and has loss some weight over the past years but nothing drastic. No recent medication changes, patient only has one medication. Patient recently saw Dr. Marcelino Scot, ortho yesterday, and was given clearance to remove boot from foot. Patient has been ambulating with RW, due to it feeling more sturdy for her. RN reviewed medications and took vitals 99%RA 81R 132/72. SW discussed goals, reviewed care plan, provided emotional support, used active and reflective listening. Palliative care will continue to monitor and assist with long term care planning as needed.  3. PATIENT/CAREGIVER EDUCATION/ COPING:  Patient A&O x3, able to answer questions appropriately. Patient denies any anxiety or depression. Patient's family is supportive 4. PERSONAL EMERGENCY PLAN:  Patient will call 9-1-1 for emergencies.  5. COMMUNITY RESOURCES COORDINATION/ HEALTH CARE NAVIGATION:  Patient and patients sister manages her care. Patient needs assistance with transportation. SW will  outreach DSS caseworker. 6. FINANCIAL/LEGAL CONCERNS/INTERVENTIONS:  None. Patient has MCD.      SOCIAL HX:  Social History   Tobacco Use  . Smoking status: Former Smoker    Packs/day: 0.25    Years: 3.00    Pack years: 0.75    Types: Cigarettes    Quit date: 12/09/1993    Years since quitting: 26.7  . Smokeless tobacco: Never Used  Substance Use Topics  . Alcohol use: No    CODE STATUS: FULL CODE  ADVANCED DIRECTIVES: N MOST FORM COMPLETE:  N. FOLLOW UP NEEDED BY NP. HOSPICE EDUCATION PROVIDED: N  PPS: Patient is independent with all ADL's. Patient has RW, [facility] and rollator if needed, Patient does not drive.  Time Spent: 35 min.       Doreene Eland, [place]

## 2020-09-11 DIAGNOSIS — E1122 Type 2 diabetes mellitus with diabetic chronic kidney disease: Secondary | ICD-10-CM | POA: Diagnosis not present

## 2020-09-11 DIAGNOSIS — M109 Gout, unspecified: Secondary | ICD-10-CM | POA: Diagnosis not present

## 2020-09-11 DIAGNOSIS — S92352D Displaced fracture of fifth metatarsal bone, left foot, subsequent encounter for fracture with routine healing: Secondary | ICD-10-CM | POA: Diagnosis not present

## 2020-09-11 DIAGNOSIS — M75101 Unspecified rotator cuff tear or rupture of right shoulder, not specified as traumatic: Secondary | ICD-10-CM | POA: Diagnosis not present

## 2020-09-11 DIAGNOSIS — M7551 Bursitis of right shoulder: Secondary | ICD-10-CM | POA: Diagnosis not present

## 2020-09-11 DIAGNOSIS — M1909 Primary osteoarthritis, other specified site: Secondary | ICD-10-CM | POA: Diagnosis not present

## 2020-09-12 DIAGNOSIS — M109 Gout, unspecified: Secondary | ICD-10-CM | POA: Diagnosis not present

## 2020-09-12 DIAGNOSIS — M1909 Primary osteoarthritis, other specified site: Secondary | ICD-10-CM | POA: Diagnosis not present

## 2020-09-12 DIAGNOSIS — M75101 Unspecified rotator cuff tear or rupture of right shoulder, not specified as traumatic: Secondary | ICD-10-CM | POA: Diagnosis not present

## 2020-09-12 DIAGNOSIS — E1122 Type 2 diabetes mellitus with diabetic chronic kidney disease: Secondary | ICD-10-CM | POA: Diagnosis not present

## 2020-09-12 DIAGNOSIS — S92352D Displaced fracture of fifth metatarsal bone, left foot, subsequent encounter for fracture with routine healing: Secondary | ICD-10-CM | POA: Diagnosis not present

## 2020-09-12 DIAGNOSIS — M7551 Bursitis of right shoulder: Secondary | ICD-10-CM | POA: Diagnosis not present

## 2020-09-14 DIAGNOSIS — M1909 Primary osteoarthritis, other specified site: Secondary | ICD-10-CM | POA: Diagnosis not present

## 2020-09-14 DIAGNOSIS — E1122 Type 2 diabetes mellitus with diabetic chronic kidney disease: Secondary | ICD-10-CM | POA: Diagnosis not present

## 2020-09-14 DIAGNOSIS — M7551 Bursitis of right shoulder: Secondary | ICD-10-CM | POA: Diagnosis not present

## 2020-09-14 DIAGNOSIS — M109 Gout, unspecified: Secondary | ICD-10-CM | POA: Diagnosis not present

## 2020-09-14 DIAGNOSIS — M75101 Unspecified rotator cuff tear or rupture of right shoulder, not specified as traumatic: Secondary | ICD-10-CM | POA: Diagnosis not present

## 2020-09-14 DIAGNOSIS — S92352D Displaced fracture of fifth metatarsal bone, left foot, subsequent encounter for fracture with routine healing: Secondary | ICD-10-CM | POA: Diagnosis not present

## 2020-09-18 DIAGNOSIS — E1122 Type 2 diabetes mellitus with diabetic chronic kidney disease: Secondary | ICD-10-CM | POA: Diagnosis not present

## 2020-09-18 DIAGNOSIS — S92352D Displaced fracture of fifth metatarsal bone, left foot, subsequent encounter for fracture with routine healing: Secondary | ICD-10-CM | POA: Diagnosis not present

## 2020-09-18 DIAGNOSIS — M75101 Unspecified rotator cuff tear or rupture of right shoulder, not specified as traumatic: Secondary | ICD-10-CM | POA: Diagnosis not present

## 2020-09-18 DIAGNOSIS — M7551 Bursitis of right shoulder: Secondary | ICD-10-CM | POA: Diagnosis not present

## 2020-09-18 DIAGNOSIS — M1909 Primary osteoarthritis, other specified site: Secondary | ICD-10-CM | POA: Diagnosis not present

## 2020-09-18 DIAGNOSIS — M109 Gout, unspecified: Secondary | ICD-10-CM | POA: Diagnosis not present

## 2020-09-19 DIAGNOSIS — M1909 Primary osteoarthritis, other specified site: Secondary | ICD-10-CM | POA: Diagnosis not present

## 2020-09-19 DIAGNOSIS — M75101 Unspecified rotator cuff tear or rupture of right shoulder, not specified as traumatic: Secondary | ICD-10-CM | POA: Diagnosis not present

## 2020-09-19 DIAGNOSIS — M7551 Bursitis of right shoulder: Secondary | ICD-10-CM | POA: Diagnosis not present

## 2020-09-19 DIAGNOSIS — M109 Gout, unspecified: Secondary | ICD-10-CM | POA: Diagnosis not present

## 2020-09-19 DIAGNOSIS — E1122 Type 2 diabetes mellitus with diabetic chronic kidney disease: Secondary | ICD-10-CM | POA: Diagnosis not present

## 2020-09-19 DIAGNOSIS — S92352D Displaced fracture of fifth metatarsal bone, left foot, subsequent encounter for fracture with routine healing: Secondary | ICD-10-CM | POA: Diagnosis not present

## 2020-09-26 DIAGNOSIS — M1909 Primary osteoarthritis, other specified site: Secondary | ICD-10-CM | POA: Diagnosis not present

## 2020-09-26 DIAGNOSIS — W19XXXD Unspecified fall, subsequent encounter: Secondary | ICD-10-CM | POA: Diagnosis not present

## 2020-09-26 DIAGNOSIS — Z993 Dependence on wheelchair: Secondary | ICD-10-CM | POA: Diagnosis not present

## 2020-09-26 DIAGNOSIS — Z87891 Personal history of nicotine dependence: Secondary | ICD-10-CM | POA: Diagnosis not present

## 2020-09-26 DIAGNOSIS — M7551 Bursitis of right shoulder: Secondary | ICD-10-CM | POA: Diagnosis not present

## 2020-09-26 DIAGNOSIS — R001 Bradycardia, unspecified: Secondary | ICD-10-CM | POA: Diagnosis not present

## 2020-09-26 DIAGNOSIS — Z86010 Personal history of colonic polyps: Secondary | ICD-10-CM | POA: Diagnosis not present

## 2020-09-26 DIAGNOSIS — D509 Iron deficiency anemia, unspecified: Secondary | ICD-10-CM | POA: Diagnosis not present

## 2020-09-26 DIAGNOSIS — K279 Peptic ulcer, site unspecified, unspecified as acute or chronic, without hemorrhage or perforation: Secondary | ICD-10-CM | POA: Diagnosis not present

## 2020-09-26 DIAGNOSIS — E1122 Type 2 diabetes mellitus with diabetic chronic kidney disease: Secondary | ICD-10-CM | POA: Diagnosis not present

## 2020-09-26 DIAGNOSIS — Z90710 Acquired absence of both cervix and uterus: Secondary | ICD-10-CM | POA: Diagnosis not present

## 2020-09-26 DIAGNOSIS — M109 Gout, unspecified: Secondary | ICD-10-CM | POA: Diagnosis not present

## 2020-09-26 DIAGNOSIS — Z9181 History of falling: Secondary | ICD-10-CM | POA: Diagnosis not present

## 2020-09-26 DIAGNOSIS — R011 Cardiac murmur, unspecified: Secondary | ICD-10-CM | POA: Diagnosis not present

## 2020-09-26 DIAGNOSIS — N184 Chronic kidney disease, stage 4 (severe): Secondary | ICD-10-CM | POA: Diagnosis not present

## 2020-09-26 DIAGNOSIS — E785 Hyperlipidemia, unspecified: Secondary | ICD-10-CM | POA: Diagnosis not present

## 2020-09-26 DIAGNOSIS — H9319 Tinnitus, unspecified ear: Secondary | ICD-10-CM | POA: Diagnosis not present

## 2020-09-26 DIAGNOSIS — Z79891 Long term (current) use of opiate analgesic: Secondary | ICD-10-CM | POA: Diagnosis not present

## 2020-09-26 DIAGNOSIS — I129 Hypertensive chronic kidney disease with stage 1 through stage 4 chronic kidney disease, or unspecified chronic kidney disease: Secondary | ICD-10-CM | POA: Diagnosis not present

## 2020-09-26 DIAGNOSIS — S92352D Displaced fracture of fifth metatarsal bone, left foot, subsequent encounter for fracture with routine healing: Secondary | ICD-10-CM | POA: Diagnosis not present

## 2020-09-26 DIAGNOSIS — M75101 Unspecified rotator cuff tear or rupture of right shoulder, not specified as traumatic: Secondary | ICD-10-CM | POA: Diagnosis not present

## 2020-09-26 DIAGNOSIS — K5730 Diverticulosis of large intestine without perforation or abscess without bleeding: Secondary | ICD-10-CM | POA: Diagnosis not present

## 2020-09-29 DIAGNOSIS — E1122 Type 2 diabetes mellitus with diabetic chronic kidney disease: Secondary | ICD-10-CM | POA: Diagnosis not present

## 2020-09-29 DIAGNOSIS — M109 Gout, unspecified: Secondary | ICD-10-CM | POA: Diagnosis not present

## 2020-09-29 DIAGNOSIS — M75101 Unspecified rotator cuff tear or rupture of right shoulder, not specified as traumatic: Secondary | ICD-10-CM | POA: Diagnosis not present

## 2020-09-29 DIAGNOSIS — M7551 Bursitis of right shoulder: Secondary | ICD-10-CM | POA: Diagnosis not present

## 2020-09-29 DIAGNOSIS — S92352D Displaced fracture of fifth metatarsal bone, left foot, subsequent encounter for fracture with routine healing: Secondary | ICD-10-CM | POA: Diagnosis not present

## 2020-09-29 DIAGNOSIS — M1909 Primary osteoarthritis, other specified site: Secondary | ICD-10-CM | POA: Diagnosis not present

## 2020-10-03 DIAGNOSIS — E1122 Type 2 diabetes mellitus with diabetic chronic kidney disease: Secondary | ICD-10-CM | POA: Diagnosis not present

## 2020-10-03 DIAGNOSIS — M75101 Unspecified rotator cuff tear or rupture of right shoulder, not specified as traumatic: Secondary | ICD-10-CM | POA: Diagnosis not present

## 2020-10-03 DIAGNOSIS — S92352D Displaced fracture of fifth metatarsal bone, left foot, subsequent encounter for fracture with routine healing: Secondary | ICD-10-CM | POA: Diagnosis not present

## 2020-10-03 DIAGNOSIS — M7551 Bursitis of right shoulder: Secondary | ICD-10-CM | POA: Diagnosis not present

## 2020-10-03 DIAGNOSIS — M109 Gout, unspecified: Secondary | ICD-10-CM | POA: Diagnosis not present

## 2020-10-03 DIAGNOSIS — M1909 Primary osteoarthritis, other specified site: Secondary | ICD-10-CM | POA: Diagnosis not present

## 2020-10-04 ENCOUNTER — Other Ambulatory Visit: Payer: Self-pay

## 2020-10-04 NOTE — Patient Outreach (Signed)
[facility]) Care Management  10/04/2020  Megan Stevenson 05-15-1941 [id number]    Case closure:  Placed call to patient for 4th attempt at outreach.No answer.   PLAN: no response to letter outreach or telephone attempts x 4. Will close case and send MD letter of case closure and reason.  Tomasa Rand, RN, BSN, CEN [facility] [employer] [phone]

## 2020-10-10 DIAGNOSIS — M109 Gout, unspecified: Secondary | ICD-10-CM | POA: Diagnosis not present

## 2020-10-10 DIAGNOSIS — M1909 Primary osteoarthritis, other specified site: Secondary | ICD-10-CM | POA: Diagnosis not present

## 2020-10-10 DIAGNOSIS — S92352D Displaced fracture of fifth metatarsal bone, left foot, subsequent encounter for fracture with routine healing: Secondary | ICD-10-CM | POA: Diagnosis not present

## 2020-10-10 DIAGNOSIS — M7551 Bursitis of right shoulder: Secondary | ICD-10-CM | POA: Diagnosis not present

## 2020-10-10 DIAGNOSIS — E1122 Type 2 diabetes mellitus with diabetic chronic kidney disease: Secondary | ICD-10-CM | POA: Diagnosis not present

## 2020-10-10 DIAGNOSIS — M75101 Unspecified rotator cuff tear or rupture of right shoulder, not specified as traumatic: Secondary | ICD-10-CM | POA: Diagnosis not present

## 2020-10-11 DIAGNOSIS — M109 Gout, unspecified: Secondary | ICD-10-CM | POA: Diagnosis not present

## 2020-10-11 DIAGNOSIS — M75101 Unspecified rotator cuff tear or rupture of right shoulder, not specified as traumatic: Secondary | ICD-10-CM | POA: Diagnosis not present

## 2020-10-11 DIAGNOSIS — E1122 Type 2 diabetes mellitus with diabetic chronic kidney disease: Secondary | ICD-10-CM | POA: Diagnosis not present

## 2020-10-11 DIAGNOSIS — M1909 Primary osteoarthritis, other specified site: Secondary | ICD-10-CM | POA: Diagnosis not present

## 2020-10-11 DIAGNOSIS — M7551 Bursitis of right shoulder: Secondary | ICD-10-CM | POA: Diagnosis not present

## 2020-10-11 DIAGNOSIS — S92352D Displaced fracture of fifth metatarsal bone, left foot, subsequent encounter for fracture with routine healing: Secondary | ICD-10-CM | POA: Diagnosis not present

## 2020-10-12 DIAGNOSIS — M7551 Bursitis of right shoulder: Secondary | ICD-10-CM | POA: Diagnosis not present

## 2020-10-12 DIAGNOSIS — M1909 Primary osteoarthritis, other specified site: Secondary | ICD-10-CM | POA: Diagnosis not present

## 2020-10-12 DIAGNOSIS — E1122 Type 2 diabetes mellitus with diabetic chronic kidney disease: Secondary | ICD-10-CM | POA: Diagnosis not present

## 2020-10-12 DIAGNOSIS — M75101 Unspecified rotator cuff tear or rupture of right shoulder, not specified as traumatic: Secondary | ICD-10-CM | POA: Diagnosis not present

## 2020-10-12 DIAGNOSIS — M109 Gout, unspecified: Secondary | ICD-10-CM | POA: Diagnosis not present

## 2020-10-12 DIAGNOSIS — S92352D Displaced fracture of fifth metatarsal bone, left foot, subsequent encounter for fracture with routine healing: Secondary | ICD-10-CM | POA: Diagnosis not present

## 2020-10-16 DIAGNOSIS — M75101 Unspecified rotator cuff tear or rupture of right shoulder, not specified as traumatic: Secondary | ICD-10-CM | POA: Diagnosis not present

## 2020-10-16 DIAGNOSIS — M109 Gout, unspecified: Secondary | ICD-10-CM | POA: Diagnosis not present

## 2020-10-16 DIAGNOSIS — S92352D Displaced fracture of fifth metatarsal bone, left foot, subsequent encounter for fracture with routine healing: Secondary | ICD-10-CM | POA: Diagnosis not present

## 2020-10-16 DIAGNOSIS — M1909 Primary osteoarthritis, other specified site: Secondary | ICD-10-CM | POA: Diagnosis not present

## 2020-10-16 DIAGNOSIS — E1122 Type 2 diabetes mellitus with diabetic chronic kidney disease: Secondary | ICD-10-CM | POA: Diagnosis not present

## 2020-10-16 DIAGNOSIS — M7551 Bursitis of right shoulder: Secondary | ICD-10-CM | POA: Diagnosis not present

## 2020-10-18 DIAGNOSIS — E1122 Type 2 diabetes mellitus with diabetic chronic kidney disease: Secondary | ICD-10-CM | POA: Diagnosis not present

## 2020-10-18 DIAGNOSIS — M109 Gout, unspecified: Secondary | ICD-10-CM | POA: Diagnosis not present

## 2020-10-18 DIAGNOSIS — M7551 Bursitis of right shoulder: Secondary | ICD-10-CM | POA: Diagnosis not present

## 2020-10-18 DIAGNOSIS — S92352D Displaced fracture of fifth metatarsal bone, left foot, subsequent encounter for fracture with routine healing: Secondary | ICD-10-CM | POA: Diagnosis not present

## 2020-10-18 DIAGNOSIS — M1909 Primary osteoarthritis, other specified site: Secondary | ICD-10-CM | POA: Diagnosis not present

## 2020-10-18 DIAGNOSIS — M75101 Unspecified rotator cuff tear or rupture of right shoulder, not specified as traumatic: Secondary | ICD-10-CM | POA: Diagnosis not present

## 2020-10-20 ENCOUNTER — Telehealth: Payer: Self-pay

## 2020-10-20 NOTE — Telephone Encounter (Signed)
12:40PM: Palliative care SW outreached patient/family for monthly telephonic visit.  SW left HIPPA complaint VM. Awaiting return call.  Will continue to offer palliative care support.

## 2020-10-24 DIAGNOSIS — S92352D Displaced fracture of fifth metatarsal bone, left foot, subsequent encounter for fracture with routine healing: Secondary | ICD-10-CM | POA: Diagnosis not present

## 2020-10-24 DIAGNOSIS — M7551 Bursitis of right shoulder: Secondary | ICD-10-CM | POA: Diagnosis not present

## 2020-10-24 DIAGNOSIS — E1122 Type 2 diabetes mellitus with diabetic chronic kidney disease: Secondary | ICD-10-CM | POA: Diagnosis not present

## 2020-10-24 DIAGNOSIS — M1909 Primary osteoarthritis, other specified site: Secondary | ICD-10-CM | POA: Diagnosis not present

## 2020-10-24 DIAGNOSIS — M75101 Unspecified rotator cuff tear or rupture of right shoulder, not specified as traumatic: Secondary | ICD-10-CM | POA: Diagnosis not present

## 2020-10-24 DIAGNOSIS — M109 Gout, unspecified: Secondary | ICD-10-CM | POA: Diagnosis not present

## 2020-10-25 DIAGNOSIS — E1122 Type 2 diabetes mellitus with diabetic chronic kidney disease: Secondary | ICD-10-CM | POA: Diagnosis not present

## 2020-10-25 DIAGNOSIS — M7551 Bursitis of right shoulder: Secondary | ICD-10-CM | POA: Diagnosis not present

## 2020-10-25 DIAGNOSIS — M75101 Unspecified rotator cuff tear or rupture of right shoulder, not specified as traumatic: Secondary | ICD-10-CM | POA: Diagnosis not present

## 2020-10-25 DIAGNOSIS — M1909 Primary osteoarthritis, other specified site: Secondary | ICD-10-CM | POA: Diagnosis not present

## 2020-10-25 DIAGNOSIS — S92352D Displaced fracture of fifth metatarsal bone, left foot, subsequent encounter for fracture with routine healing: Secondary | ICD-10-CM | POA: Diagnosis not present

## 2020-10-25 DIAGNOSIS — M109 Gout, unspecified: Secondary | ICD-10-CM | POA: Diagnosis not present

## 2020-10-26 DIAGNOSIS — N184 Chronic kidney disease, stage 4 (severe): Secondary | ICD-10-CM | POA: Diagnosis not present

## 2020-10-26 DIAGNOSIS — Z87891 Personal history of nicotine dependence: Secondary | ICD-10-CM | POA: Diagnosis not present

## 2020-10-26 DIAGNOSIS — E785 Hyperlipidemia, unspecified: Secondary | ICD-10-CM | POA: Diagnosis not present

## 2020-10-26 DIAGNOSIS — Z993 Dependence on wheelchair: Secondary | ICD-10-CM | POA: Diagnosis not present

## 2020-10-26 DIAGNOSIS — Z79899 Other long term (current) drug therapy: Secondary | ICD-10-CM | POA: Diagnosis not present

## 2020-10-26 DIAGNOSIS — W19XXXD Unspecified fall, subsequent encounter: Secondary | ICD-10-CM | POA: Diagnosis not present

## 2020-10-26 DIAGNOSIS — I129 Hypertensive chronic kidney disease with stage 1 through stage 4 chronic kidney disease, or unspecified chronic kidney disease: Secondary | ICD-10-CM | POA: Diagnosis not present

## 2020-10-26 DIAGNOSIS — M1909 Primary osteoarthritis, other specified site: Secondary | ICD-10-CM | POA: Diagnosis not present

## 2020-10-26 DIAGNOSIS — Z90710 Acquired absence of both cervix and uterus: Secondary | ICD-10-CM | POA: Diagnosis not present

## 2020-10-26 DIAGNOSIS — H9319 Tinnitus, unspecified ear: Secondary | ICD-10-CM | POA: Diagnosis not present

## 2020-10-26 DIAGNOSIS — D509 Iron deficiency anemia, unspecified: Secondary | ICD-10-CM | POA: Diagnosis not present

## 2020-10-26 DIAGNOSIS — R011 Cardiac murmur, unspecified: Secondary | ICD-10-CM | POA: Diagnosis not present

## 2020-10-26 DIAGNOSIS — Z86010 Personal history of colonic polyps: Secondary | ICD-10-CM | POA: Diagnosis not present

## 2020-10-26 DIAGNOSIS — Z9181 History of falling: Secondary | ICD-10-CM | POA: Diagnosis not present

## 2020-10-26 DIAGNOSIS — Z79891 Long term (current) use of opiate analgesic: Secondary | ICD-10-CM | POA: Diagnosis not present

## 2020-10-26 DIAGNOSIS — K279 Peptic ulcer, site unspecified, unspecified as acute or chronic, without hemorrhage or perforation: Secondary | ICD-10-CM | POA: Diagnosis not present

## 2020-10-26 DIAGNOSIS — M7551 Bursitis of right shoulder: Secondary | ICD-10-CM | POA: Diagnosis not present

## 2020-10-26 DIAGNOSIS — M109 Gout, unspecified: Secondary | ICD-10-CM | POA: Diagnosis not present

## 2020-10-26 DIAGNOSIS — E1122 Type 2 diabetes mellitus with diabetic chronic kidney disease: Secondary | ICD-10-CM | POA: Diagnosis not present

## 2020-10-26 DIAGNOSIS — R001 Bradycardia, unspecified: Secondary | ICD-10-CM | POA: Diagnosis not present

## 2020-10-26 DIAGNOSIS — S92352D Displaced fracture of fifth metatarsal bone, left foot, subsequent encounter for fracture with routine healing: Secondary | ICD-10-CM | POA: Diagnosis not present

## 2020-10-26 DIAGNOSIS — K5730 Diverticulosis of large intestine without perforation or abscess without bleeding: Secondary | ICD-10-CM | POA: Diagnosis not present

## 2020-10-26 DIAGNOSIS — M75101 Unspecified rotator cuff tear or rupture of right shoulder, not specified as traumatic: Secondary | ICD-10-CM | POA: Diagnosis not present

## 2020-10-31 DIAGNOSIS — M1909 Primary osteoarthritis, other specified site: Secondary | ICD-10-CM | POA: Diagnosis not present

## 2020-10-31 DIAGNOSIS — I129 Hypertensive chronic kidney disease with stage 1 through stage 4 chronic kidney disease, or unspecified chronic kidney disease: Secondary | ICD-10-CM | POA: Diagnosis not present

## 2020-10-31 DIAGNOSIS — M109 Gout, unspecified: Secondary | ICD-10-CM | POA: Diagnosis not present

## 2020-10-31 DIAGNOSIS — S92352D Displaced fracture of fifth metatarsal bone, left foot, subsequent encounter for fracture with routine healing: Secondary | ICD-10-CM | POA: Diagnosis not present

## 2020-10-31 DIAGNOSIS — M75101 Unspecified rotator cuff tear or rupture of right shoulder, not specified as traumatic: Secondary | ICD-10-CM | POA: Diagnosis not present

## 2020-10-31 DIAGNOSIS — M7551 Bursitis of right shoulder: Secondary | ICD-10-CM | POA: Diagnosis not present

## 2020-11-06 DIAGNOSIS — S92352D Displaced fracture of fifth metatarsal bone, left foot, subsequent encounter for fracture with routine healing: Secondary | ICD-10-CM | POA: Diagnosis not present

## 2020-11-06 DIAGNOSIS — I129 Hypertensive chronic kidney disease with stage 1 through stage 4 chronic kidney disease, or unspecified chronic kidney disease: Secondary | ICD-10-CM | POA: Diagnosis not present

## 2020-11-06 DIAGNOSIS — M7551 Bursitis of right shoulder: Secondary | ICD-10-CM | POA: Diagnosis not present

## 2020-11-06 DIAGNOSIS — M1909 Primary osteoarthritis, other specified site: Secondary | ICD-10-CM | POA: Diagnosis not present

## 2020-11-06 DIAGNOSIS — M75101 Unspecified rotator cuff tear or rupture of right shoulder, not specified as traumatic: Secondary | ICD-10-CM | POA: Diagnosis not present

## 2020-11-06 DIAGNOSIS — M109 Gout, unspecified: Secondary | ICD-10-CM | POA: Diagnosis not present

## 2020-11-08 ENCOUNTER — Telehealth: Payer: Self-pay

## 2020-11-08 NOTE — Telephone Encounter (Signed)
1 pm.  Phone call made to patient's home but no answer and unable to leave a VM.  Phone call made to sister Welford Roche.  No answer but message has been left requesting a call back.  PLAN:  Awaiting call back.  If no call back, I will attempt to reach the patient again.

## 2020-11-09 DIAGNOSIS — S92352D Displaced fracture of fifth metatarsal bone, left foot, subsequent encounter for fracture with routine healing: Secondary | ICD-10-CM | POA: Diagnosis not present

## 2020-11-09 DIAGNOSIS — M1909 Primary osteoarthritis, other specified site: Secondary | ICD-10-CM | POA: Diagnosis not present

## 2020-11-09 DIAGNOSIS — M7551 Bursitis of right shoulder: Secondary | ICD-10-CM | POA: Diagnosis not present

## 2020-11-09 DIAGNOSIS — I129 Hypertensive chronic kidney disease with stage 1 through stage 4 chronic kidney disease, or unspecified chronic kidney disease: Secondary | ICD-10-CM | POA: Diagnosis not present

## 2020-11-09 DIAGNOSIS — M75101 Unspecified rotator cuff tear or rupture of right shoulder, not specified as traumatic: Secondary | ICD-10-CM | POA: Diagnosis not present

## 2020-11-09 DIAGNOSIS — M109 Gout, unspecified: Secondary | ICD-10-CM | POA: Diagnosis not present

## 2020-11-16 DIAGNOSIS — S92352D Displaced fracture of fifth metatarsal bone, left foot, subsequent encounter for fracture with routine healing: Secondary | ICD-10-CM | POA: Diagnosis not present

## 2020-11-16 DIAGNOSIS — M1909 Primary osteoarthritis, other specified site: Secondary | ICD-10-CM | POA: Diagnosis not present

## 2020-11-16 DIAGNOSIS — M109 Gout, unspecified: Secondary | ICD-10-CM | POA: Diagnosis not present

## 2020-11-16 DIAGNOSIS — I129 Hypertensive chronic kidney disease with stage 1 through stage 4 chronic kidney disease, or unspecified chronic kidney disease: Secondary | ICD-10-CM | POA: Diagnosis not present

## 2020-11-16 DIAGNOSIS — M75101 Unspecified rotator cuff tear or rupture of right shoulder, not specified as traumatic: Secondary | ICD-10-CM | POA: Diagnosis not present

## 2020-11-16 DIAGNOSIS — M7551 Bursitis of right shoulder: Secondary | ICD-10-CM | POA: Diagnosis not present

## 2020-11-17 ENCOUNTER — Telehealth: Payer: Self-pay

## 2020-11-17 NOTE — Telephone Encounter (Signed)
945 am.  Telephone call made to patient to complete a telephonic visit.  No answer and unable to leave a message.  PLAN:  I will try to reach out to patient again in a couple of weeks.

## 2020-11-23 DIAGNOSIS — S92352D Displaced fracture of fifth metatarsal bone, left foot, subsequent encounter for fracture with routine healing: Secondary | ICD-10-CM | POA: Diagnosis not present

## 2020-11-23 DIAGNOSIS — M1909 Primary osteoarthritis, other specified site: Secondary | ICD-10-CM | POA: Diagnosis not present

## 2020-11-23 DIAGNOSIS — M109 Gout, unspecified: Secondary | ICD-10-CM | POA: Diagnosis not present

## 2020-11-23 DIAGNOSIS — M75101 Unspecified rotator cuff tear or rupture of right shoulder, not specified as traumatic: Secondary | ICD-10-CM | POA: Diagnosis not present

## 2020-11-23 DIAGNOSIS — I129 Hypertensive chronic kidney disease with stage 1 through stage 4 chronic kidney disease, or unspecified chronic kidney disease: Secondary | ICD-10-CM | POA: Diagnosis not present

## 2020-11-23 DIAGNOSIS — M7551 Bursitis of right shoulder: Secondary | ICD-10-CM | POA: Diagnosis not present

## 2020-11-24 DIAGNOSIS — M109 Gout, unspecified: Secondary | ICD-10-CM | POA: Diagnosis not present

## 2020-11-24 DIAGNOSIS — S92352D Displaced fracture of fifth metatarsal bone, left foot, subsequent encounter for fracture with routine healing: Secondary | ICD-10-CM | POA: Diagnosis not present

## 2020-11-24 DIAGNOSIS — M7551 Bursitis of right shoulder: Secondary | ICD-10-CM | POA: Diagnosis not present

## 2020-11-24 DIAGNOSIS — M1909 Primary osteoarthritis, other specified site: Secondary | ICD-10-CM | POA: Diagnosis not present

## 2020-11-24 DIAGNOSIS — M75101 Unspecified rotator cuff tear or rupture of right shoulder, not specified as traumatic: Secondary | ICD-10-CM | POA: Diagnosis not present

## 2020-11-24 DIAGNOSIS — I129 Hypertensive chronic kidney disease with stage 1 through stage 4 chronic kidney disease, or unspecified chronic kidney disease: Secondary | ICD-10-CM | POA: Diagnosis not present

## 2020-12-22 ENCOUNTER — Telehealth: Payer: Self-pay

## 2020-12-22 NOTE — Telephone Encounter (Signed)
3:40PM: Palliative care SW returned call to patients sister, [name]. Sister shared that she is worried about patient and that patients memory is getting ans she does not know what to do to help patient. Sister states that patients husband is ill himself and goes to dialysis. Sw advises sister that PC would like to do a visit with patient, sister scheduled visit for 1/25 @10  and stated that she make patient aware.

## 2020-12-22 NOTE — Telephone Encounter (Signed)
11:50AM: Palliative care SW outreached patient's sister, [name], for monthly telephonic visit.  Call unsuccessful. SW LVM for sister. Awaiting return call. Will continue to offer palliative care support.

## 2020-12-22 NOTE — Telephone Encounter (Signed)
11:50AM: Palliative care SW outreached patient for monthly telephonic visit.  Line busy, Call unsuccessful.  Will continue to offer palliative care support.

## 2021-01-02 ENCOUNTER — Other Ambulatory Visit: Payer: Medicare Other

## 2021-01-02 ENCOUNTER — Other Ambulatory Visit: Payer: Self-pay

## 2021-01-02 NOTE — Progress Notes (Signed)
Palliative care SW arrived at patients home for 10 am scheduled in home visit. Patient/family did not answer the door.SW called patient while outside of home with no answer. SW LVM.   Visit marked as "No Show". Will continue to outreach and provide palliative care support.

## 2021-01-18 ENCOUNTER — Telehealth: Payer: Self-pay

## 2021-01-18 NOTE — Telephone Encounter (Signed)
3 pm.  Phone call made to patient to complete a telephonic visit.  Unable to leave a message on patient's VM.  PLAN:  Palliative Care will outreach patient at a later date.

## 2021-03-01 ENCOUNTER — Telehealth: Payer: Self-pay

## 2021-03-01 NOTE — Telephone Encounter (Signed)
12:50PM: Palliative care SW outreached patient for monthly telephonic visit.  Call made to patients sister, [name], as well. LVM.   Line busy, Call unsuccessful.  Will continue to offer palliative care support.

## 2021-03-01 NOTE — Telephone Encounter (Signed)
1251PM: Palliative care SW outreached PCP office ad LVM for nurse Estill Bamberg, to make aware of palliative care's lack of involvement or visits with patient due to no contact with patient and/or patient not returning call.s

## 2021-03-12 ENCOUNTER — Telehealth: Payer: Self-pay

## 2021-03-12 NOTE — Telephone Encounter (Signed)
1228 pm.  Phone call made to [facility], CMA for Dr. Ardeth Perfect.  Message has been left explaining patient will be discharged from Palliative Care services.  Team has not been able to reach patient since our initial visit in October.  Multiple attempts have been made to patient and sister has also been contacted without success.

## 2021-04-17 ENCOUNTER — Other Ambulatory Visit: Payer: Self-pay

## 2021-04-17 ENCOUNTER — Other Ambulatory Visit: Payer: Self-pay | Admitting: Family Medicine

## 2021-04-17 ENCOUNTER — Ambulatory Visit (INDEPENDENT_AMBULATORY_CARE_PROVIDER_SITE_OTHER): Payer: Medicare Other

## 2021-04-17 ENCOUNTER — Ambulatory Visit (INDEPENDENT_AMBULATORY_CARE_PROVIDER_SITE_OTHER): Payer: Medicare Other | Admitting: Family Medicine

## 2021-04-17 ENCOUNTER — Encounter: Payer: Self-pay | Admitting: Family Medicine

## 2021-04-17 VITALS — BP 162/80 | HR 70 | Ht 66.5 in | Wt 138.0 lb

## 2021-04-17 DIAGNOSIS — I10 Essential (primary) hypertension: Secondary | ICD-10-CM | POA: Diagnosis not present

## 2021-04-17 DIAGNOSIS — M1711 Unilateral primary osteoarthritis, right knee: Secondary | ICD-10-CM | POA: Diagnosis not present

## 2021-04-17 DIAGNOSIS — Z7689 Persons encountering health services in other specified circumstances: Secondary | ICD-10-CM | POA: Diagnosis not present

## 2021-04-17 DIAGNOSIS — R4189 Other symptoms and signs involving cognitive functions and awareness: Secondary | ICD-10-CM

## 2021-04-17 DIAGNOSIS — Z23 Encounter for immunization: Secondary | ICD-10-CM

## 2021-04-17 DIAGNOSIS — F0390 Unspecified dementia without behavioral disturbance: Secondary | ICD-10-CM | POA: Insufficient documentation

## 2021-04-17 DIAGNOSIS — M170 Bilateral primary osteoarthritis of knee: Secondary | ICD-10-CM | POA: Insufficient documentation

## 2021-04-17 HISTORY — DX: Unilateral primary osteoarthritis, right knee: M17.11

## 2021-04-17 MED ORDER — DONEPEZIL HCL 10 MG PO TABS: 10.0000 mg | ORAL_TABLET | Freq: Every day | ORAL | 0 refills | Status: DC

## 2021-04-17 NOTE — Assessment & Plan Note (Signed)
X-rays of right knee in September 2021 showed osteoarthritis, most severe in the lateral compartment. Patient reportedly had injections in the past and wonders if she would benefit from them again. -Consider knee injections (in [facility] office vs referral to sports medicine). Will discuss in more detail at next visit

## 2021-04-17 NOTE — Progress Notes (Signed)
   Subjective:   HPI:  Megan Stevenson is a very pleasant 80 y.o. female who presents to establish care. She is accompanied by her sister who provides majority of the history.  Patient was previously followed by Dr. Velna Hatchet at [facility]. Last seen ~3 months ago. Per sister, patient was reportedly discharged from that practice due to missed appointments.  Initial concerns:  -Needs refills on her Donepezil  -Chronic right knee pain. Was seen by ortho or sports med in the past and received injections. Wonders if she needs injections again.  Past medical history: HTN CKD Stage 4 HLD Gout Type 2 Diabetes, diet controlled Osteoarthritis Macular hole, retinal detachment of right eye Cognitive decline  Past surgical history: Hysterectomy (many years ago), right eye surgery (2015)  Current medications: allopurinol 100mg  daily, amlodipine 10mg  daily, atorvastatin 10mg  daily, clonidine 0.2mg  daily, donepezil 10mg  daily  Family history: -Her son takes multiple medications, but she's not sure what they're for -Kidney disease in her nephew (on dialysis) Denies any history of heart disease, diabetes, or stroke in her family  Social history: Lives with her ex-husband and son. Sister lives nearby and sees her regularly Denies alcohol, tobacco (quit 30 yrs ago), or recreational drug use Not sexually active  Objective:  BP (!) 162/80   Pulse 70   Ht 5' 6.5" (1.689 m)   Wt 138 lb (62.6 kg)   SpO2 97%   BMI 21.94 kg/m   Vitals and nursing note reviewed  General: NAD, pleasant, able to participate in exam HEENT: PERRL, TM normal bilaterally, oropharynx clear Neck: no cervical lymphadenopathy, thyroid normal to palpation Cardiac: RRR, S1 S2 present, III/VI systolic ejection murmur Respiratory: CTAB, normal effort, No wheezes, rales or rhonchi Abdomen: Bowel sounds present, non-tender, non-distended, no hepatosplenomegaly Extremities: no edema, 5/5 strength in all  extremities Skin: warm and dry, no rashes noted Neuro: alert, no obvious focal deficits, unsteady gait Psych: Normal affect and mood   Assessment & Plan:  New Patient Will request patient records from prior PCP ([place]).  Essential hypertension BP above goal, 162/80 at today's visit. Current medications include Amlodipine 10mg  daily and Clonidine 0.2mg  daily. Patient's sister and ex-husband help manage medications. Difficult to assess true compliance, but patient and her sister report good overall medication adherence. -Return in 1 month to discuss further. Will plan to check BMP at next visit -Would favor discontinuation of clonidine given patient's age -Of note, she did not tolerate beta blocker in the past due to bradycardia. She also has benazepril and furosemide on her allergy list.   Cognitive decline Patient is independent with ADLs, needs assistance with some but not all iADLs. -Referral to geri clinic for comprehensive eval including MOCA -Refills provided for Donepezil today  Osteoarthritis of right knee X-rays of right knee in September 2021 showed osteoarthritis, most severe in the lateral compartment. Patient reportedly had injections in the past and wonders if she would benefit from them again. -Consider knee injections (in [facility] office vs referral to sports medicine). Will discuss in more detail at next visit  Health Maintenance -Given copy of blue Advanced Directive packet today -1st dose of COVID vaccine given today -Will obtain records from prior PCP   [name], MD [place] PGY-1

## 2021-04-17 NOTE — Assessment & Plan Note (Addendum)
BP above goal, 162/80 at today's visit. Current medications include Amlodipine 10mg  daily and Clonidine 0.2mg  daily. Patient's sister and ex-husband help manage medications. Difficult to assess true compliance, but patient and her sister report good overall medication adherence. -Return in 1 month to discuss further. Will plan to check BMP at next visit -Would favor discontinuation of clonidine given patient's age -Of note, she did not tolerate beta blocker in the past due to bradycardia. She also has benazepril and furosemide on her allergy list.

## 2021-04-17 NOTE — Assessment & Plan Note (Signed)
Patient is independent with ADLs, needs assistance with some but not all iADLs. -Referral to geri clinic for comprehensive eval including MOCA -Refills provided for Donepezil today

## 2021-04-17 NOTE — Patient Instructions (Addendum)
It was great to meet you!   Today was a visit to get to know you and your medical problems. I have referred you to our "geriatric clinic" to do an in-depth evaluation of your memory, your balance, and overall health.  I have also give you a packet about "advanced directives" to look over. Please bring this with you to your next appointment.  Take care and seek immediate care sooner if you develop any concerns.   Dr. [facility]

## 2021-04-19 ENCOUNTER — Telehealth: Payer: Self-pay | Admitting: *Deleted

## 2021-04-19 NOTE — Telephone Encounter (Signed)
-----   Message from Valerie Roys, [place] sent at 04/17/2021 11:06 AM EDT ----- Regarding: geri clinic Call about cancellations and possibly waiting until after school lets out for appt.  Has to pick up grandkids in the afternoon

## 2021-04-19 NOTE — Telephone Encounter (Signed)
LM for Megan Stevenson regarding getting Megan Stevenson in for an appointment.  We discussed caregiver having some trouble with afternoon due to having to pick up grandkids from school.  I left her a message letting her know that I would put patient on my list for the next opening over the summer.  This way there should be no concerns with time constraints.  I told Megan Stevenson I would call when I got an appt made and she could call back with any questions. Tristain Daily,CMA

## 2021-04-20 ENCOUNTER — Encounter: Payer: Self-pay | Admitting: Family Medicine

## 2021-05-08 ENCOUNTER — Encounter: Payer: Self-pay | Admitting: Family Medicine

## 2021-05-08 ENCOUNTER — Ambulatory Visit (INDEPENDENT_AMBULATORY_CARE_PROVIDER_SITE_OTHER): Payer: Medicare Other | Admitting: Family Medicine

## 2021-05-08 ENCOUNTER — Ambulatory Visit (INDEPENDENT_AMBULATORY_CARE_PROVIDER_SITE_OTHER): Payer: Medicare Other

## 2021-05-08 ENCOUNTER — Other Ambulatory Visit: Payer: Self-pay

## 2021-05-08 VITALS — BP 144/68 | HR 65 | Ht 67.0 in | Wt 139.0 lb

## 2021-05-08 DIAGNOSIS — M1711 Unilateral primary osteoarthritis, right knee: Secondary | ICD-10-CM

## 2021-05-08 DIAGNOSIS — Z23 Encounter for immunization: Secondary | ICD-10-CM | POA: Diagnosis not present

## 2021-05-08 MED ORDER — METHYLPREDNISOLONE ACETATE 40 MG/ML IJ SUSP
40.0000 mg | Freq: Once | INTRAMUSCULAR | Status: AC
  Administered 2021-05-08: 40 mg via INTRA_ARTICULAR

## 2021-05-08 NOTE — Progress Notes (Signed)
    SUBJECTIVE:   CHIEF COMPLAINT / HPI:   Right Knee Pain Patient reports right knee pain for many years (>5-10years). Pain is located in anterior right knee, is worse with walking and when it's cold out. She will occasionally take Tylenol without significant relief. Denies any other joint pain. No trauma or injury. Patient states she got injections in the past which were helpful. Has not had injections for several years. Has been told she has arthritis and gout before.  PERTINENT  PMH / PSH: Osteoarthritis, gout, HTN, CKD  OBJECTIVE:   BP (!) 144/68   Pulse 65   Ht 5\' 7"  (1.702 m)   Wt 139 lb (63 kg)   SpO2 99%   BMI 21.77 kg/m   Gen: alert, well-appearing, NAD MSK: R knee without swelling or deformity. No skin changes or increased warmth. Moderate tenderness to palpation along central-medial joint line. Full ROM with flexion and extension with very slight hyperextension noted. No pain with varus/valgus stress. Negative anterior/posterior drawer tests.  Abnormal gait that favors left side significantly.    ASSESSMENT/PLAN:   Osteoarthritis of right knee X-rays of right knee in September 2021 showed osteoarthritis, most severe in the lateral compartment. Patient did well with corticosteroid injections in the past and would like an injection today. -Corticosteroid injection performed as described below -Advised to walk with a cane to prevent falls due to her antalgic gait  PROCEDURE: R KNEE INJECTION Patient was given informed consent, signed copy in the chart. Appropriate time out was taken. Area prepped with betadine and alcohol swabs. A 21 gauge 1 1/2 inch needle was used. 1 cc of methylprednisolone 40 mg/ml plus  4 cc of 1% lidocaine without epinephrine was injected into the right knee using a(n) medial approach.   The patient tolerated the procedure well. There were no complications. Post procedure instructions were given.    [name], MD [place]

## 2021-05-08 NOTE — Patient Instructions (Addendum)
It was great to see you!  Today we did a joint injection for your knee arthritis. You may have increased pain over the next 24 hours but then your pain should improve. You should use a cane when you walk to help prevent falls.   Once the kids are out of school, Lucresha should be seen in our geriatric clinic. Please call our office to schedule that appointment at your earliest convenience.  Take care and seek immediate care sooner if you develop any concerns.  Dr. Rock Nephew   Knee Injection A knee injection is a procedure to get medicine into your knee joint to relieve the pain, swelling, and stiffness of arthritis. Your health care provider uses a needle to inject medicine, which may also help to lubricate and cushion your knee joint. You may need more than one injection. Tell a health care provider about:  Any allergies you have.  All medicines you are taking, including vitamins, herbs, eye drops, creams, and over-the-counter medicines.  Any problems you or family members have had with anesthetic medicines.  Any blood disorders you have.  Any surgeries you have had.  Any medical conditions you have.  Whether you are pregnant or may be pregnant. What are the risks? Generally, this is a safe procedure. However, problems may occur, including:  Infection.  Bleeding.  Symptoms that get worse.  Damage to the area around your knee.  Allergic reaction to any of the medicines.  Skin reactions from repeated injections. What happens before the procedure?  Ask your health care provider about: ? Changing or stopping your regular medicines. This is especially important if you are taking diabetes medicines or blood thinners. ? Taking medicines such as aspirin and ibuprofen. These medicines can thin your blood. Do not take these medicines unless your health care provider tells you to take them. ? Taking over-the-counter medicines, vitamins, herbs, and supplements.  Plan to have a  responsible adult take you home from the hospital or clinic. What happens during the procedure?  You will sit or lie down in a position for your knee to be treated.  The skin over your kneecap will be cleaned with a germ-killing soap.  You will be given a medicine that numbs the area (local anesthetic). You may feel some stinging.  The medicine will be injected into your knee. The needle is carefully placed between your kneecap and your knee. The medicine is injected into the joint space.  The needle will be removed at the end of the procedure.  A bandage (dressing) may be placed over the injection site. The procedure may vary among health care providers and hospitals.   What can I expect after the procedure?  Your blood pressure, heart rate, breathing rate, and blood oxygen level will be monitored until you leave the hospital or clinic.  You may have to move your knee through its full range of motion. This helps to get all the medicine into your joint space.  You will be watched to make sure that you do not have a reaction to the injected medicine.  You may feel more pain, swelling, and warmth than you did before the injection. This reaction may last about 1-2 days. Follow these instructions at home: Medicines  Take over-the-counter and prescription medicines only as told by your health care provider.  Ask your health care provider if the medicine prescribed to you requires you to avoid driving or using machinery.  Do not take medicines such as aspirin and ibuprofen  unless your health care provider tells you to take them. Injection site care  Follow instructions from your health care provider about: ? How to take care of your puncture site. ? When and how you should change your dressing. ? When you should remove your dressing.  Check your injection area every day for signs of infection. Check for: ? More redness, swelling, or pain after 2 days. ? Fluid or blood. ? Pus or a  bad smell. ? Warmth. Managing pain, stiffness, and swelling  If directed, put ice on the injection area. To do this: ? Put ice in a plastic bag. ? Place a towel between your skin and the bag. ? Leave the ice on for 20 minutes, 2-3 times per day. ? Remove the ice if your skin turns bright red. This is very important. If you cannot feel pain, heat, or cold, you have a greater risk of damage to the area.  Do not apply heat to your knee.  Raise (elevate) the injection area above the level of your heart while you are sitting or lying down.   General instructions  If you were given a dressing, keep it dry until your health care provider says it can be removed. Ask your health care provider when you can start showering or bathing.  Avoid strenuous activities for as long as directed by your health care provider. Ask your health care provider when you can return to your normal activities.  Keep all follow-up visits. This is important. You may need more injections. Contact a health care provider if you have:  A fever.  Warmth in your injection area.  Fluid, blood, or pus coming from your injection site.  Symptoms at your injection site that last longer than 2 days after your procedure. Get help right away if:  Your knee turns very red.  Your knee becomes very swollen.  Your knee is in severe pain. Summary  A knee injection is a procedure to get medicine into your knee joint to relieve the pain, swelling, and stiffness of arthritis.  A needle is carefully placed between your kneecap and your knee to inject medicine into the joint space.  Before the procedure, ask your health care provider about changing or stopping your regular medicines, especially if you are taking diabetes medicines or blood thinners.  Contact your health care provider if you have any problems or questions after your procedure. This information is not intended to replace advice given to you by your health care  provider. Make sure you discuss any questions you have with your health care provider. Document Revised: 05/10/2020 Document Reviewed: 05/10/2020 Elsevier Patient Education  2021 [employer].

## 2021-05-08 NOTE — Assessment & Plan Note (Addendum)
X-rays of right knee in September 2021 showed osteoarthritis, most severe in the lateral compartment. Patient did well with corticosteroid injections in the past and would like an injection today. -Corticosteroid injection performed as described below -Advised to walk with a cane to prevent falls due to her antalgic gait

## 2021-05-16 ENCOUNTER — Telehealth: Payer: Self-pay | Admitting: *Deleted

## 2021-05-16 DIAGNOSIS — Z0189 Encounter for other specified special examinations: Secondary | ICD-10-CM

## 2021-05-16 NOTE — Telephone Encounter (Signed)
Spoke with sister Welford Roche (home #) and updated her on appt status.  She is aware that she will be receiving a call from CCM in the few weeks prior to appt.  I have also mailed her an appt reminder.  Britta Louth,CMA

## 2021-05-18 ENCOUNTER — Telehealth: Payer: Self-pay | Admitting: *Deleted

## 2021-05-18 NOTE — Chronic Care Management (AMB) (Signed)
  Care Management   Note  05/18/2021 Name: KEIMYA BRIDDELL MRN: [id number] DOB: 12-04-1941  Liliane Shi Bartl is a 80 y.o. year old female who is a primary care patient of [name], MD. I reached out to Lorenso Courier by phone today in response to a referral sent by Ms. Nichole W Trapp's Dr. Rock Nephew.     Ms. Magnussen was given information about care management services today including:  Care management services include personalized support from designated clinical staff supervised by her physician, including individualized plan of care and coordination with other care providers 24/7 contact phone numbers for assistance for urgent and routine care needs. The patient may stop care management services at any time by phone call to the office staff.  Sister Welford Roche DPR on file  verbally agreed to assistance and services provided by embedded care coordination/care management team today.  Follow up plan: Telephone appointment with care management team member scheduled for:06/07/2021  [place] [facility]  Direct Dial: [phone]

## 2021-06-04 ENCOUNTER — Other Ambulatory Visit: Payer: Self-pay | Admitting: Family Medicine

## 2021-06-04 DIAGNOSIS — R4189 Other symptoms and signs involving cognitive functions and awareness: Secondary | ICD-10-CM

## 2021-06-07 ENCOUNTER — Encounter: Payer: Self-pay | Admitting: Licensed Clinical Social Worker

## 2021-06-07 ENCOUNTER — Ambulatory Visit: Payer: Medicare Other | Admitting: Licensed Clinical Social Worker

## 2021-06-07 DIAGNOSIS — Z0189 Encounter for other specified special examinations: Secondary | ICD-10-CM

## 2021-06-07 NOTE — Chronic Care Management (AMB) (Signed)
Care Management Clinical Social Work  [facility] Psychosocial    06/07/2021 Name: Megan Stevenson MRN: [id number] DOB: Sep 18, 1941  Megan Stevenson is a 80 y.o. year old female who sees Megan Dad, MD for primary care.    Engaged with patient's sister  for initial visit in response to a referral from Dr. McDiarmid for social work care coordination services to assess needs, support and barriers to care prior to [place] clinic appointment  Consent to Services:  [name] was given information about Care Management services today including:  Care Management services includes personalized support from designated clinical staff supervised by her physician, including individualized plan of care and coordination with other care providers 24/7 contact phone numbers for assistance for urgent and routine care needs. The patient may stop case management services at any time by phone call to the office staff.  Patient's sister agreed to services and consent obtained.    (Look under patient's history for social information from assessment) Assessment:Patient's sister Megan Stevenson provided all information during this encounter. Per sister patient has declined assistance with PCS aide. Sister will work with son to get Medicaid transportation set up for patient so that he can attend medical appointment with her when sister is not able to take patient. See Care Plan below for interventions and patient self-care actives related to advance care planning.  Recent life changes /stressors: continue decline in memory;  limited support in the home  Recommendation: Patient may benefit from, however is not in agreement to move forward with personal care services.   Follow up Plan: No follow up scheduled with CCM team at this time. Will follow up based on need after [facility] clinic appointment.   Review of patient past medical history, allergies, medications, and health status, including review of relevant consultants reports  was performed today as part of a comprehensive evaluation and provision of chronic care management and care coordination services.  SDOH(Social Determinants of Health) assessments and interventions performed:  SDOH Interventions    Flowsheet Row Most Recent Value  SDOH Interventions   Transportation Interventions Other (Comment)  [medicaid Transportation]        Advanced Directives Status: See Care Plan for related entries.  Care Plan  Conditions to be addressed/monitored: Dementia; Level of care concerns  Care Plan : General Social Work (Adult)  Updates made by Megan Cane, LCSW since 06/07/2021 12:00 AM     Problem: No Advance Directive      Goal: Effective Long-Term Care Planning   Start Date: 06/07/2021  This Visit's Progress: On track  Priority: Medium  Note:   Current barriers:   Patient does not have an advance directive.  Needs education, support and coordination in order to meet this need. Clinical Goal(s): Over the next 30 days, the patient and family will review information on advance directive, complete advance directive packet and have notarized.  Interventions: Collaboration with PCP regarding development and update of comprehensive plan of care as evidenced by provider attestation and co-signature. Inter-disciplinary care team collaboration (see longitudinal plan of care) A voluntary discussion about advanced care planning including importance of advanced directives, healthcare proxy and living will was discussed with the patient's sister .  Patient Goals/Self-Care Activities : Over the next 30 days Complete Advance Directive packet,  Have advance directive notarized and provide a copy to provider office Call LCSW if you have questions      Allergies  Allergen Reactions   Nsaids Other (See Comments)    REACTION: Has  chronic renal disease   Benazepril Hcl Other (See Comments)    REACTION: Dizziness   Furosemide Other (See Comments)    REACTION:  Hypernatremia   Atenolol Other (See Comments)    REACTION: Headaches   Outpatient Encounter Medications as of 06/07/2021  Medication Sig   allopurinol (ZYLOPRIM) 100 MG tablet Take 100 mg by mouth daily.   amLODipine (NORVASC) 10 MG tablet Take 1 tablet by mouth daily.   atorvastatin (LIPITOR) 10 MG tablet Take 1 tablet by mouth at bedtime.   cloNIDine (CATAPRES - DOSED IN MG/24 HR) 0.2 mg/24hr patch Place 1 patch (0.2 mg total) onto the skin once a week.   cloNIDine (CATAPRES) 0.2 MG tablet Take 0.2 mg by mouth daily.   donepezil (ARICEPT) 10 MG tablet TAKE 1 TABLET(10 MG) BY MOUTH DAILY   No facility-administered encounter medications on file as of 06/07/2021.   Patient Active Problem List   Diagnosis Date Noted   Cognitive decline 04/17/2021   Osteoarthritis of right knee 04/17/2021   Diet-controlled diabetes mellitus ([place]) 06/13/2018   CKD (chronic kidney disease) stage 4, GFR 15-29 ml/min (HCC) 06/13/2018   Heart murmur 06/13/2018   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   Macular hole, 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   OSTEOARTHRITIS, 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   DYSLIPIDEMIA 07/24/2007   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   Essential hypertension 07/24/2007   DIVERTICULOSIS, SIGMOID COLON 07/24/2007   COLONIC POLYPS, HX OF 07/24/2007    [name], [place] / [place]   [phone] 3:49 PM

## 2021-06-07 NOTE — Patient Instructions (Signed)
Licensed Clinical Social Worker Visit Information  Goals we discussed today:   Goals Addressed             This Visit's Progress    Effective Long-Term Care Planning       Patient Goals/Self-Care Activities :  Complete Advance Directive packet,  Have advance directive notarized and provide a copy to provider office Call LCSW if you have questions        Ms. Edgren was given information about Care Management services today including:  Care Management services include personalized support from designated clinical staff supervised by her physician, including individualized plan of care and coordination with other care providers 24/7 contact phone numbers for assistance for urgent and routine care needs. The patient may stop Care Management services at any time by phone call to the office staff.   Patient's sister verbally agreed to assistance and services provided by embedded care coordination/care management team today.  Patient verbalizes understanding of instructions provided today and agrees to view in [place].   Follow up plan: SW will follow up with patient by phone over the next 2 to 3 weeks  [name], [place] / [place]   [phone] 3:53 PM

## 2021-06-14 ENCOUNTER — Other Ambulatory Visit: Payer: Self-pay

## 2021-06-14 ENCOUNTER — Ambulatory Visit (INDEPENDENT_AMBULATORY_CARE_PROVIDER_SITE_OTHER): Payer: Medicare Other | Admitting: Family Medicine

## 2021-06-14 VITALS — BP 181/75 | HR 68 | Wt 138.0 lb

## 2021-06-14 DIAGNOSIS — Z9181 History of falling: Secondary | ICD-10-CM | POA: Diagnosis not present

## 2021-06-14 DIAGNOSIS — Z7409 Other reduced mobility: Secondary | ICD-10-CM | POA: Diagnosis not present

## 2021-06-14 DIAGNOSIS — Z01118 Encounter for examination of ears and hearing with other abnormal findings: Secondary | ICD-10-CM | POA: Diagnosis not present

## 2021-06-14 DIAGNOSIS — F0390 Unspecified dementia without behavioral disturbance: Secondary | ICD-10-CM

## 2021-06-14 DIAGNOSIS — M170 Bilateral primary osteoarthritis of knee: Secondary | ICD-10-CM

## 2021-06-14 DIAGNOSIS — M6284 Sarcopenia: Secondary | ICD-10-CM

## 2021-06-14 DIAGNOSIS — I6381 Other cerebral infarction due to occlusion or stenosis of small artery: Secondary | ICD-10-CM

## 2021-06-14 HISTORY — DX: Other cerebral infarction due to occlusion or stenosis of small artery: I63.81

## 2021-06-15 ENCOUNTER — Encounter: Payer: Self-pay | Admitting: Family Medicine

## 2021-06-15 NOTE — Progress Notes (Addendum)
Provider:  Lissa Morales, MD Location:   [place]   Place of Service:   [place]  PCP: [name], MD Patient Care Team: [name], MD as PCP - General (Family Medicine) [name], LCSW as Social Worker (Licensed Clinical Social Worker)  Extended Emergency Contact Information Primary Emergency Contact: Summers,Barbara Address: [address]          [place], [place] [address] [place] Phone: [phone] Relation: Sister Secondary Emergency Contact: Nelia Shi [place] Phone: [phone] Relation: Son  Code Status: DNR Goals of Care: Advanced Directive information Advanced Directives 06/14/2021  Does Patient Have a Medical Advance Directive? Yes  Type of Advance Directive [place] in Chart? No - copy requested  Would patient like information on creating a medical advance directive? -  Pre-existing out of facility DNR order (yellow form or pink MOST form) -     [place] [place] [facility]:   Patient is accompanied by sibling Primary caregiver:  Ex-husband (blind) and step-son, her sister, Pierce Crane, looks in on her frequently thruout the week.  Patient's Currently living arrangement:  Ex-husband and steo-son Patient information was obtained from office visit notes, lab reports, MRI brain reports patient and sister. History/Exam limitations:  dementia Primary Care Provider:   [name] Referring provider:  Tama Gander Reason for referral:  Dementia  ----------------------------------------------------------------------------------------------------------------------------------------------------------------------------------------------------------------------------------------------------------------   HPI by problems:  Chief Complaint  Patient presents with   geriatric assessment     Cognitive impairment concern  Are there problems with thinking?  Cognition domains: Memory difficulties, Orientation to time difficulty, and Learning difficulty with new activities or information  When were the changes first noticed?  year  Did this change occur abruptly or gradually?  gradual  How have the changes progressed since then?  gradually worsening  Has there been any tremors or abnormal movements?  no  Have they had in hallucinations or delusions:  no  Have they appeared more anxious or sad lately?  yes  Do they still have interests or activities they enjor doing?  no  How has their appetite been lately?  show no change  How has their sleep been lately?  Adequate.     Compared to 5 to 10 years ago, how is the patient at:  Problems with Judgment, e.g., problem making decisions, bad financial decisions, problems with thinking?  Has not needed to make major decisions for 'a long time'  Less interested in hobbies or previously enjoyed activities?  yes  Problem remembering things about family and friends e.g. names,  occupations, birthdays, addresses?  yes  Problem remembering conversations or news events a few days later?  yes  Problem remembering what day and month it is? yes  Problem with losing things?  yes  Problem learning to use a new gadget or machine around the house, e.g., cell phones, computer, microwave, remote control?  yes  Problem with handling money for shopping?  no  Problem handling financial matters, e.g. their pension, checking, credit cards, dealing with the bank?  yes, Ex-husband has managed her finances for a long time  Problem with getting lost in familiar places?  no   Geriatric Depression Scale:  1 / 15   Outpatient Encounter Medications as of 06/14/2021  Medication Sig   allopurinol (ZYLOPRIM) 100 MG tablet Take 100 mg by mouth daily.   amLODipine (NORVASC) 10 MG tablet Take  1 tablet by mouth daily.    atorvastatin (LIPITOR) 10 MG tablet Take 1 tablet by mouth at bedtime.   cloNIDine (CATAPRES) 0.2 MG tablet Take 0.2 mg by mouth daily.   donepezil (ARICEPT) 10 MG tablet TAKE 1 TABLET(10 MG) BY MOUTH DAILY   cloNIDine (CATAPRES - DOSED IN MG/24 HR) 0.2 mg/24hr patch Place 1 patch (0.2 mg total) onto the skin once a week. (Patient not taking: Reported on 06/14/2021)   No facility-administered encounter medications on file as of 06/14/2021.    History Patient Active Problem List   Diagnosis Date Noted   Type 2 diabetes mellitus with diabetic chronic kidney disease ([place]) 06/13/2018    Priority: High   CKD (chronic kidney disease) stage 4, GFR 15-29 ml/min ([place]) 06/13/2018    Priority: High   Hyperlipidemia associated with type 2 diabetes mellitus ([place]) 07/24/2007    Priority: High   Hypertension associated with diabetes ([place]) 07/24/2007    Priority: High   Microcytic anemia 06/13/2018    Priority: Medium   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    Priority: Medium   Osteoarthritis of right knee 04/17/2021    Priority: Low   Heart murmur 06/13/2018    Priority: Low   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    Priority: Low   Macular hole, 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    Priority: Low   OSTEOARTHRITIS, 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    Priority: Low   Cognitive decline 04/17/2021   Past Medical History:  Diagnosis Date   Anemia    Arthritis    Ataxia 06/13/2018   Bradycardia 06/13/2018   Bradycardia, sinus 06/13/2018   CKD (chronic kidney disease) stage 4, GFR 15-29 ml/min (HCC) 06/13/2018   Cognitive decline    COLONIC POLYPS, HX OF 07/24/2007   Annotation: 6/06 Qualifier: History of  By: Radene Ou MD, [place]     Diabetes mellitus without complication ([place])    no medications   DIVERTICULOSIS, SIGMOID COLON 07/24/2007   Qualifier: Diagnosis of  By: Radene Ou MD, [place]     Dysrhythmia    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   Qualifier: Diagnosis of  By: Radene Ou MD, [place]     Hyperlipidemia associated  with type 2 diabetes mellitus ([place]) 07/24/2007   Qualifier: Diagnosis of  By: Radene Ou MD, [place]     Hypertension    Hypertension associated with diabetes ([place]) 07/24/2007   Qualifier: Diagnosis of  By: Radene Ou MD, [place]     Lacunar infarction ([place]) 06/14/2021   Brain MRI 06/13/2018: Small deep white matter and basal ganglia lacunar infarcts   Macular hole    OD   Macular hole, 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   Metatarsal fracture, Right foot 08/25/2020   Osteoarthritis of right knee 04/17/2021   PUD 07/24/2007   Annotation: 10/97 Qualifier: Diagnosis of  By: Radene Ou MD, [place]     Renal disorder    Sees someone [facility]   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   ROTATOR CUFF SYNDROME, 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   Qualifier: Diagnosis of  By: Radene Ou MD, [place] Hawks     SUBACROMIAL BURSITIS, 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   Qualifier: Diagnosis of  By: Radene Ou MD, [place]     Type 2 diabetes mellitus with diabetic chronic kidney disease ([place]) 06/13/2018   Wears glasses    Past Surgical History:  Procedure Laterality Date   25 GAUGE PARS PLANA VITRECTOMY WITH 20 GAUGE MVR PORT FOR MACULAR HOLE Right 06/14/2014   Procedure: 25 GAUGE PARS PLANA VITRECTOMY WITH 20 GAUGE MVR PORT FOR MACULAR HOLE; serum  patch; gas injection; laser treament; C3F8 gas fluid exchange.;  Surgeon: Hayden Pedro, MD;  Location: [place];  Service: Ophthalmology;  Laterality: Right;   ABDOMINAL HYSTERECTOMY     EYE SURGERY     GANGLION CYST EXCISION Left 02/02/2015   Procedure: EXCISION OF VOLAR GANGLION CYST LEFT WRIST;  Surgeon: Leanora Cover, MD;  Location: [place];  Service: Orthopedics;  Laterality: Left;   GAS INSERTION Right 07/19/2014   Procedure: INSERTION OF GAS;  Surgeon: Hayden Pedro, MD;  Location: [place];  Service: Ophthalmology;  Laterality: Right;  C3F8   LASER PHOTO ABLATION Right 07/19/2014   Procedure: LASER PHOTO ABLATION;  Surgeon: Hayden Pedro, MD;  Location: [place];  Service:  Ophthalmology;  Laterality: Right;   PARS PLANA VITRECTOMY W/ REPAIR OF MACULAR HOLE Right 06/14/2014   DR MATTHEWS   SCLERAL BUCKLE Right 07/19/2014   Procedure: SCLERAL BUCKLE;  Surgeon: Hayden Pedro, MD;  Location: [place];  Service: Ophthalmology;  Laterality: Right;   History reviewed. No pertinent family history. Social History   Socioeconomic History   Marital status: Divorced    Spouse name: Not on file   Number of children: Not on file   Years of education: 10   Highest education level: 10th grade  Occupational History   Not on file  Tobacco Use   Smoking status: Former    Packs/day: 0.25    Years: 3.00    Pack years: 0.75    Types: Cigarettes    Quit date: 12/09/1993    Years since quitting: 27.5   Smokeless tobacco: Never  Substance and Sexual Activity   Alcohol use: No   Drug use: No   Sexual activity: Not Currently  Other Topics Concern   Not on file  Social History Narrative   Social Information ( 06/07/21)   patient lives with husband who is blind and step son  . Patient enjoys watching TV and spends most of her time in bed . Primary family support person is sister [name] who lives near by, support is limited as sister also has health concerns and caring for her minor grandchildren.    Transportation to appointments provided by sister [name];        Basic Activities of Daily Living per sister Marland Mcalpine   Dressing:Self-care   Eating: Self-care   Ambulation: Partial assistance ( uses a walker or cane)   Toileting: Self-care   Bathing: Self-care       Instrumental Activities of Daily Living   Shopping: Total assistance   House/Yard Work: Dance movement psychotherapist of medications: Partial assistance   Finances: Total assistance   Telephone: Partial assistance   Transportation: Total assistance   Social Determinants of Health   Financial Resource Strain: Not on file  Food Insecurity: No Food Insecurity   Worried About Charity fundraiser in the  Last Year: Never true   Ran Out of Food in the Last Year: Never true  Transportation Needs: Unmet Transportation Needs   Lack of Transportation (Medical): Yes   Lack of Transportation (Non-Medical): No  Physical Activity: Not on file  Stress: Not on file  Social Connections: Not on file      Cardiovascular Risk Factors:Chronic Kidney Disease-IV; CVA, Hypertension, Lipids, and NIDDM  Educational History: 10 years formal education Personal History of Seizures: No -  Personal History of Stroke: Yes - 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 Personal History of Head Trauma: No -  Personal History of  Psychiatric Disorders: No -   Basic Activities of Daily Living per sister Marland Mcalpine Dressing:Self-care Eating: Self-care Ambulation: Partial assistance ( uses a walker or cane) Toileting: Self-care Bathing: Self-care   Instrumental Activities of Daily Living Shopping: Total assistance House/Yard Work: Education officer, environmental of medications: Total assistance Finances: Total assistance Telephone: Partial assistance Transportation: Total assistance    Mobility Assist Devices: Cane 1- point [employer] using furniture and walls  Caregivers in home: Ex-husband and step-son   Formal Home Health Assistance  Physical Therapy: no  Occupational Therapy: no             Home Aid / Personal Care Service: no             Homemaker services: no  FALLS in last five office visits:  Fall Risk  06/14/2021 05/08/2021 04/17/2021  Falls in the past year? 0 0 0  Number falls in past yr: 0 0 -    Health Maintenance reviewed: Immunization History  Administered Date(s) Administered   Influenza Whole 12/25/2007   PFIZER Comirnaty(Gray Top)Covid-19 Tri-Sucrose Vaccine 04/17/2021, 05/08/2021   Pneumococcal Polysaccharide-23 12/10/2004   Td 12/09/1996, 08/11/2007   Health Maintenance Topics with due status: Overdue     Topic Date Due   FOOT EXAM Never done   OPHTHALMOLOGY EXAM Never done    Hepatitis C Screening Never done   Zoster Vaccines- Shingrix Never done   PNA vac Low Risk Adult 09/15/2006   TETANUS/TDAP 08/10/2017   HEMOGLOBIN A1C 12/14/2018    Diet: Regular Nutritional supplements: none  Geriatric Syndromes: Constipation no  Laxative use:no   Incontinence no  Nocturia: no Dizziness no   Syncope no  Balance impairment:yes    Skin problems no   Visual Impairment no   Hearing impairment no Dentures problems: no Eating impairment no  Impaired Memory or Cognition yes   Behavioral problems no   Sleep problems no   Weight loss no Drug Misadventure: yes   Joint pain: yes Joint stiffness: yes Osteoporosis: no Pressure Ulcers: no Immobility: decreased mobility Ankle edema: no History of UTIs: no   Vital Signs Weight: 138 lb (62.6 kg) Body mass index is 21.61 kg/m. CrCl cannot be calculated (Patient's most recent lab result is older than the maximum 21 days allowed.). Body surface area is 1.72 meters squared. Vitals:   06/14/21 1344  BP: (!) 181/75  Pulse: 68  Weight: 138 lb (62.6 kg)   Wt Readings from Last 3 Encounters:  06/14/21 138 lb (62.6 kg)  05/08/21 139 lb (63 kg)  04/17/21 138 lb (62.6 kg)   Hearing Screening  Method: Audiometry   500Hz  1000Hz  2000Hz  4000Hz   Right ear 40 Fail Fail Fail  Left ear Fail Fail Fail Fail   Vision Screening   Right eye Left eye Both eyes  Without correction  20/40 20/40  With correction       Physical Examination:  VS reviewed Physical Exam  Vitals:   06/14/21 1344  BP: (!) 181/75  Pulse: 68    HEENT: Bilateral EAC adequately patent, I.e., able to see TMs General physical exam: well-dressed and groomed. Pleasant and cooperative. Cardiac: Regular rate and rhythm without murmur. Lungs: Clear to auscultation.   No other insights were derived from the general Exam   Parkinsonism Yes []  No [x]   Rigidity Ab nl ? Yes []  No [x]   Sensory Abnl ? Yes []  No [x]   Gait Abnl ? []  slow, []  petit  ped, []  heel past toe not present, []  forward stoop  present, []  en bloc turn present, []  symmetric stance phase, [x]  leg pasts midline present- tripped toe of one foot against heel of other,  wants to use wall to cruise (+) Tremor ? Yes []  No [x]     Gait pace slow, but not exaggeratedly so  Timed Up & Go Test: Unable to rise from chair without arms Sit-to-Stand Test: Unable perform one sit to stand    Mini-Mental State Examination or Montreal Cognitive Assessment:  Patient did  require additional cues or prompts to complete tasks. Patient was not cooperative and attentive to testing tasks Patient did  appear motivated to perform well, though refused to attempt serial 7 task    No flowsheet data found.  No flowsheet data found.      Montreal Cognitive Assessment  06/18/2021  Visuospatial/ Executive (0/5) 0  Naming (0/3) 1  Attention: Read list of digits (0/2) 0  Attention: Read list of letters (0/1) 1  Attention: Serial 7 subtraction starting at 100 (0/3) 0  Language: Repeat phrase (0/2) 0  Language : Fluency (0/1) 0  Abstraction (0/2) 0  Delayed Recall (0/5) 0  Orientation (0/6) 2  Total 4  Adjusted Score (based on education) 5     Labs No components found for: [facility]  Lab Results  Component Value Date   VITAMINB12 484 06/13/2018    Lab Results  Component Value Date   FOLATE 16.8 06/13/2018    Lab Results  Component Value Date   TSH 0.947 06/13/2018    No results found for: RPR  No results found for: HIV    Chemistry      Component Value Date/Time   NA 140 08/26/2020 1020   K 3.8 08/26/2020 1020   CL 109 08/26/2020 1020   CO2 17 (L) 08/26/2020 1020   BUN 33 (H) 08/26/2020 1020   CREATININE 2.40 (H) 08/26/2020 1020      Component Value Date/Time   CALCIUM 9.7 08/26/2020 1020   ALKPHOS 84 08/26/2020 0502   AST 11 (L) 08/26/2020 0502   ALT 16 08/26/2020 0502   BILITOT 0.8 08/26/2020 0502       CrCl cannot be calculated (Patient's most recent  lab result is older than the maximum 21 days allowed.).   Lab Results  Component Value Date   HGBA1C 5.7 (H) 06/13/2018     @10RELATIVEDAYS @ Hearing Screening  Method: Audiometry   500Hz  1000Hz  2000Hz  4000Hz   Right ear 40 Fail Fail Fail  Left ear Fail Fail Fail Fail   Vision Screening   Right eye Left eye Both eyes  Without correction  20/40 20/40  With correction      Lab Results  Component Value Date   WBC 8.0 08/26/2020   HGB 8.5 (L) 08/26/2020   HCT 25.0 (L) 08/26/2020   MCV 72.1 (L) 08/26/2020   PLT 361 08/26/2020    No results found for this or any previous visit (from the past 24 hour(s)).  Imaging Head CT 08/26/20: Indication: Fall - no acute abnormality  Brain MRI 06/13/2018: cerebral atrophy, deep white matter and basal ganglia lacunar infarcts.  No acute changes.     Advanced Directives Code Status: Do Not Resuscitate listed, not confirmed Advance Directives: Patient is notarizing her Advanced Directive document today at clinic.  Patient has cognitive capacity to make choices of agents for her health care and make informed decisions about her advanced care wishes , in my opinion.   Assessment and Plan: Please see individual consultation notes from physical  therapy, pharmacy and social work for today.    Problem List Items Addressed This Visit     RESOLVED: Lacunar infarction [facility])   No problem-specific Assessment & Plan notes found for this encounter.    Primary Contact: Extended Emergency Contact Information Primary Emergency Contact: Summers,Barbara Address: [address]          [place], [place] [address] [place] Phone: [phone] Relation: Sister Secondary Emergency Contact: Nelia Shi States of [place] Phone: [phone] Relation: Son   59 minutes face to face were spent in total with interdisciplinary discussion, patient and caretaker counseling and coordination of care took more than 20 minutes.  The Geriatric interdisciplinary team meet to discuss the patient's assessment, problem list, and recommendations.  The interdisciplinary team consisted of representatives from medicine, pharmacy, and social work. The interdisciplinary team meet with the patient and caretakers to review the team's findings, assessments, and recommendations.

## 2021-06-18 ENCOUNTER — Encounter: Payer: Self-pay | Admitting: Family Medicine

## 2021-06-18 DIAGNOSIS — M6284 Sarcopenia: Secondary | ICD-10-CM | POA: Insufficient documentation

## 2021-06-18 DIAGNOSIS — Z01118 Encounter for examination of ears and hearing with other abnormal findings: Secondary | ICD-10-CM | POA: Insufficient documentation

## 2021-06-18 DIAGNOSIS — Z7409 Other reduced mobility: Secondary | ICD-10-CM | POA: Insufficient documentation

## 2021-06-18 DIAGNOSIS — Z9181 History of falling: Secondary | ICD-10-CM | POA: Insufficient documentation

## 2021-06-18 NOTE — Assessment & Plan Note (Addendum)
Severe valgus knee deformities bilaterally, acquired, most likely due to severe degenerative osteoarthritis of bilateral knees.  I suspect that patient's dementia is a relative contraindication to knee replacement by her orthopedist.  Dementia can be a real impediment to participating in the physical rehab necessary to have success after knee replacement.

## 2021-06-18 NOTE — Assessment & Plan Note (Addendum)
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  MoCA = 5/30 Moderate stage dementia - I suspect that patient receives a good deal of unrecognized assistance in accomplishing her iADLs and ADLs. There is no behavioral issues at this time.  Her family appears to have adequate support system to meet her current needs.  Given the recent Cochrane review showing declining in cognition and function soon after stopping AcHE inhibitors in patients with dementia, I would continue her donepezil.   I discussed with the patient and her sister the role of diet, exercise and socialization in slowing the progression of dementia.   Should the family desire to learn more on caring for people with dementia, I recommend them contacting the local [employer].

## 2021-06-18 NOTE — Assessment & Plan Note (Signed)
Hearing Screening  Method: Audiometry   500Hz  1000Hz  2000Hz  4000Hz   Right ear 40 Fail Fail Fail  Left ear Fail Fail Fail Fail   Vision Screening   Right eye Left eye Both eyes  Without correction  20/40 20/40  With correction      No occlusion of EACs  Patient denies hearing difficulty. I do not 'encourage' hearing aids unless patients are interested in them, otherwise, expensive devices are dispensed, only to sit in the patients' dresser drawers unused.   Should Megan Stevenson desire hearing aids, there are several for-profit audiologists who also provide hearing aids in the community.  Her Medicaid insurance covers hearing aids. [facility] is a good non-profit that provides testing and hearing aids.

## 2021-06-18 NOTE — Assessment & Plan Note (Signed)
Established problem Impaired proximal muscle strength, sarcopenia, severe knee OA with deforming bilateral valgus deviations, impaired balance are all present Referral for [facility]ational [facility].  Patient's dementia and need for assistance to leave home makes home health rehab necessary rather than outpatient rehab.

## 2021-06-18 NOTE — Assessment & Plan Note (Signed)
Established problem Muscular wasting: temporal wasting, thigh wasting. Poor proximal muscle strength - Unable rise from chair without using arms.   Recommend HH Physical and Occupational Therapy -

## 2021-06-18 NOTE — Assessment & Plan Note (Signed)
History of fall in 08/2020 evaluated in ED Slower gait Proximal muscle weakness and sarcopenia

## 2021-06-18 NOTE — Addendum Note (Signed)
Addended byWendy Poet, Maxim Bedel D on: 06/18/2021 01:08 PM   Modules accepted: Orders

## 2021-06-19 ENCOUNTER — Ambulatory Visit: Payer: Self-pay | Admitting: Licensed Clinical Social Worker

## 2021-06-19 ENCOUNTER — Telehealth: Payer: Self-pay

## 2021-06-19 DIAGNOSIS — M109 Gout, unspecified: Secondary | ICD-10-CM | POA: Diagnosis not present

## 2021-06-19 DIAGNOSIS — E1122 Type 2 diabetes mellitus with diabetic chronic kidney disease: Secondary | ICD-10-CM | POA: Diagnosis not present

## 2021-06-19 DIAGNOSIS — E1159 Type 2 diabetes mellitus with other circulatory complications: Secondary | ICD-10-CM | POA: Diagnosis not present

## 2021-06-19 DIAGNOSIS — M6284 Sarcopenia: Secondary | ICD-10-CM | POA: Diagnosis not present

## 2021-06-19 DIAGNOSIS — Z8673 Personal history of transient ischemic attack (TIA), and cerebral infarction without residual deficits: Secondary | ICD-10-CM | POA: Diagnosis not present

## 2021-06-19 DIAGNOSIS — E1169 Type 2 diabetes mellitus with other specified complication: Secondary | ICD-10-CM | POA: Diagnosis not present

## 2021-06-19 DIAGNOSIS — F0390 Unspecified dementia without behavioral disturbance: Secondary | ICD-10-CM | POA: Diagnosis not present

## 2021-06-19 DIAGNOSIS — N184 Chronic kidney disease, stage 4 (severe): Secondary | ICD-10-CM | POA: Diagnosis not present

## 2021-06-19 DIAGNOSIS — I152 Hypertension secondary to endocrine disorders: Secondary | ICD-10-CM | POA: Diagnosis not present

## 2021-06-19 DIAGNOSIS — E785 Hyperlipidemia, unspecified: Secondary | ICD-10-CM | POA: Diagnosis not present

## 2021-06-19 DIAGNOSIS — Z87891 Personal history of nicotine dependence: Secondary | ICD-10-CM | POA: Diagnosis not present

## 2021-06-19 DIAGNOSIS — M1711 Unilateral primary osteoarthritis, right knee: Secondary | ICD-10-CM | POA: Diagnosis not present

## 2021-06-19 DIAGNOSIS — Z789 Other specified health status: Secondary | ICD-10-CM

## 2021-06-19 NOTE — Telephone Encounter (Signed)
[facility] PT calls nurse line to report elevated BP reading at today's visit, 196/90. Megan Stevenson reports the patient is asymptomatic. Patient denies chest pain, SOB, vision changes, or headache. Megan Stevenson reports the patients sister comes by every morning to give her medications. Megan Stevenson is assuming Amlodipine is one of them, however can not confirm the patient took today.   I called patients sister to discuss and get scheduled, however I had to LVM. I stressed the importance of calling me back to make an apt and to discuss ED precautions.   Will forward to PCP in the meantime.

## 2021-06-19 NOTE — Chronic Care Management (AMB) (Signed)
  Care Management  Collaboration  Note  06/19/2021 Name: Megan Stevenson MRN: [id number] DOB: October 04, 1941  Megan Stevenson is a 80 y.o. year old female who is a primary care patient of [name], MD. The CCM team was consulted reference care coordination needs for Advanced Directive Education.  Assessment: Patient was not interviewed or contacted during this encounter. No needs were identified during [place] clinic other than getting the advance direct notarized. Patient needs a current ID.     See Care Plan below for interventions and patient self-care actives.   Intervention:Conducted brief assessment, recommendations and relevant information discussed.CCM LCSW  collaborated with Doctor, hospital .  Follow up Plan: No follow up scheduled with CCM team at this time. Patient may benefit from CCM LCSW to remain part of care team for the next 90 days.  If no needs are identified in the next 90 days, CCM LCSW will disconnect from the care team.   Review of patient past medical history, allergies, medications, and health status, including review of pertinent consultant reports was performed as part of comprehensive evaluation and provision of care management/care coordination services.   Care Plan Conditions to be addressed/monitored per PCP order: Dementia,   Patient Care Plan: General Social Work (Adult)   Problem Identified: No Advance Directive    Goal: Effective Long-Term Care Planning   Start Date: 06/07/2021  This Visit's Progress: On track  Priority: Medium  Current barriers:   Patient does not have an advance directive.  Needs education, support and coordination in order to meet this need. Clinical Goal(s): Over the next 30 days, the patient and family will review information on advance directive, complete advance directive packet and have notarized.  Interventions: Collaboration with PCP regarding development and update of comprehensive plan of care as evidenced by provider  attestation and co-signature. Inter-disciplinary care team collaboration (see longitudinal plan of care) A voluntary discussion about advanced care planning including importance of advanced directives, healthcare proxy and living will was discussed with the patient's sister .  Patient Goals/Self-Care Activities : Over the next 30 days Complete Advance Directive packet,  Have advance directive notarized and provide a copy to provider office Call LCSW if you have questions       [name], LCSW

## 2021-06-28 DIAGNOSIS — E1159 Type 2 diabetes mellitus with other circulatory complications: Secondary | ICD-10-CM | POA: Diagnosis not present

## 2021-06-28 DIAGNOSIS — N184 Chronic kidney disease, stage 4 (severe): Secondary | ICD-10-CM | POA: Diagnosis not present

## 2021-06-28 DIAGNOSIS — M1711 Unilateral primary osteoarthritis, right knee: Secondary | ICD-10-CM | POA: Diagnosis not present

## 2021-06-28 DIAGNOSIS — E1169 Type 2 diabetes mellitus with other specified complication: Secondary | ICD-10-CM | POA: Diagnosis not present

## 2021-06-28 DIAGNOSIS — I152 Hypertension secondary to endocrine disorders: Secondary | ICD-10-CM | POA: Diagnosis not present

## 2021-06-28 DIAGNOSIS — E1122 Type 2 diabetes mellitus with diabetic chronic kidney disease: Secondary | ICD-10-CM | POA: Diagnosis not present

## 2021-06-29 ENCOUNTER — Telehealth: Payer: Self-pay

## 2021-06-29 DIAGNOSIS — I152 Hypertension secondary to endocrine disorders: Secondary | ICD-10-CM | POA: Diagnosis not present

## 2021-06-29 DIAGNOSIS — E1169 Type 2 diabetes mellitus with other specified complication: Secondary | ICD-10-CM | POA: Diagnosis not present

## 2021-06-29 DIAGNOSIS — N184 Chronic kidney disease, stage 4 (severe): Secondary | ICD-10-CM | POA: Diagnosis not present

## 2021-06-29 DIAGNOSIS — M1711 Unilateral primary osteoarthritis, right knee: Secondary | ICD-10-CM | POA: Diagnosis not present

## 2021-06-29 DIAGNOSIS — Z7409 Other reduced mobility: Secondary | ICD-10-CM

## 2021-06-29 DIAGNOSIS — F0390 Unspecified dementia without behavioral disturbance: Secondary | ICD-10-CM

## 2021-06-29 DIAGNOSIS — E1122 Type 2 diabetes mellitus with diabetic chronic kidney disease: Secondary | ICD-10-CM | POA: Diagnosis not present

## 2021-06-29 DIAGNOSIS — E1159 Type 2 diabetes mellitus with other circulatory complications: Secondary | ICD-10-CM | POA: Diagnosis not present

## 2021-06-29 NOTE — Telephone Encounter (Signed)
Radovan from [facility] calling for OT verbal orders as follows:  1 time(s) weekly for 5 week(s)   Verbal orders given per [facility] protocol.  OT is also requesting DME orders for [facility] and [facility] transfer bench. Please route back to RN team once DME orders have been placed.    Talbot Grumbling, RN

## 2021-07-02 ENCOUNTER — Telehealth: Payer: Self-pay

## 2021-07-02 DIAGNOSIS — I152 Hypertension secondary to endocrine disorders: Secondary | ICD-10-CM | POA: Diagnosis not present

## 2021-07-02 DIAGNOSIS — E1169 Type 2 diabetes mellitus with other specified complication: Secondary | ICD-10-CM | POA: Diagnosis not present

## 2021-07-02 DIAGNOSIS — E1159 Type 2 diabetes mellitus with other circulatory complications: Secondary | ICD-10-CM | POA: Diagnosis not present

## 2021-07-02 DIAGNOSIS — E1122 Type 2 diabetes mellitus with diabetic chronic kidney disease: Secondary | ICD-10-CM | POA: Diagnosis not present

## 2021-07-02 DIAGNOSIS — N184 Chronic kidney disease, stage 4 (severe): Secondary | ICD-10-CM | POA: Diagnosis not present

## 2021-07-02 DIAGNOSIS — M1711 Unilateral primary osteoarthritis, right knee: Secondary | ICD-10-CM | POA: Diagnosis not present

## 2021-07-02 NOTE — Telephone Encounter (Signed)
Received phone call from PT regarding elevated BP. Reports that BP during visit was 180/100, however this was after exertion.   After rest, BP came down to 170/90. Patient remained asymptomatic throughout visit.   Called patient's sister regarding elevated BP.   Patient has AWV with [facility] and is requesting BP check at this time. Advised that this does not substitute the need for provider appointment, as medications may need to be adjusted. Due to transportation issues, patient cannot schedule with provider at this time.   Advised of ED precautions.   Talbot Grumbling, RN

## 2021-07-03 ENCOUNTER — Ambulatory Visit (INDEPENDENT_AMBULATORY_CARE_PROVIDER_SITE_OTHER): Payer: Medicare Other

## 2021-07-03 ENCOUNTER — Other Ambulatory Visit: Payer: Self-pay

## 2021-07-03 VITALS — BP 134/72 | HR 58 | Ht 67.25 in | Wt 137.0 lb

## 2021-07-03 DIAGNOSIS — E1169 Type 2 diabetes mellitus with other specified complication: Secondary | ICD-10-CM | POA: Diagnosis not present

## 2021-07-03 DIAGNOSIS — E1159 Type 2 diabetes mellitus with other circulatory complications: Secondary | ICD-10-CM | POA: Diagnosis not present

## 2021-07-03 DIAGNOSIS — E1122 Type 2 diabetes mellitus with diabetic chronic kidney disease: Secondary | ICD-10-CM | POA: Diagnosis not present

## 2021-07-03 DIAGNOSIS — Z0000 Encounter for general adult medical examination without abnormal findings: Secondary | ICD-10-CM | POA: Diagnosis not present

## 2021-07-03 DIAGNOSIS — I152 Hypertension secondary to endocrine disorders: Secondary | ICD-10-CM | POA: Diagnosis not present

## 2021-07-03 DIAGNOSIS — M1711 Unilateral primary osteoarthritis, right knee: Secondary | ICD-10-CM | POA: Diagnosis not present

## 2021-07-03 DIAGNOSIS — N184 Chronic kidney disease, stage 4 (severe): Secondary | ICD-10-CM | POA: Diagnosis not present

## 2021-07-03 NOTE — Progress Notes (Signed)
Subjective:   Megan Stevenson is a 80 y.o. female who presents for Medicare Annual (Subsequent) preventive examination.  Review of Systems: Defer to PCP  Cardiac Risk Factors include: advanced age (>73men, >52 women);diabetes mellitus  Objective:   Vitals: Ht 5' 7.25" (1.708 m)   Wt 137 lb (62.1 kg)   BMI 21.30 kg/m   Body mass index is 21.3 kg/m.  Advanced Directives 07/03/2021 06/14/2021 05/08/2021 04/17/2021 08/21/2020 06/13/2018 11/11/2017  Does Patient Have a Medical Advance Directive? No Yes No No No No No  Type of Advance Directive - [place] in Chart? - No - copy requested - - - - -  Would patient like information on creating a medical advance directive? Yes (MAU/Ambulatory/Procedural Areas - Information given) - Yes (MAU/Ambulatory/Procedural Areas - Information given) No - Patient declined No - Patient declined No - Patient declined No - Patient declined  Pre-existing out of facility DNR order (yellow form or pink MOST form) - - - - - - -   Tobacco Social History   Tobacco Use  Smoking Status Former   Packs/day: 0.25   Years: 3.00   Pack years: 0.75   Types: Cigarettes   Quit date: 12/09/1993   Years since quitting: 27.5  Smokeless Tobacco Never     Clinical Intake:  Pre-visit preparation completed: Yes  Pain Score: 9   How often do you need to have someone help you when you read instructions, pamphlets, or other written materials from your doctor or pharmacy?: 3 - Sometimes What is the last grade level you completed in school?: 9th grade  Interpreter Needed?: No  Past Medical History:  Diagnosis Date   Anemia    Arthritis    Ataxia 06/13/2018   Bradycardia 06/13/2018   Bradycardia, sinus 06/13/2018   CKD (chronic kidney disease) stage 4, GFR 15-29 ml/min ([place]) 06/13/2018   Cognitive decline    COLONIC POLYPS, HX OF 07/24/2007   Annotation: 6/06 Qualifier: History of  By: Radene Ou MD, [place]      Diabetes mellitus without complication ([place])    no medications   DIVERTICULOSIS, SIGMOID COLON 07/24/2007   Qualifier: Diagnosis of  By: Radene Ou MD, [place]     Dysrhythmia    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   Qualifier: Diagnosis of  By: Radene Ou MD, [place]     Hyperlipidemia associated with type 2 diabetes mellitus ([place]) 07/24/2007   Qualifier: Diagnosis of  By: Radene Ou MD, [place]     Hypertension    Hypertension associated with diabetes ([place]) 07/24/2007   Qualifier: Diagnosis of  By: Radene Ou MD, [place]     Lacunar infarction ([place]) 06/14/2021   Brain MRI 06/13/2018: Small deep white matter and basal ganglia lacunar infarcts   Macular hole    OD   Macular hole, 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   Metatarsal fracture, Right foot 08/25/2020   Osteoarthritis of right knee 04/17/2021   PUD 07/24/2007   Annotation: 10/97 Qualifier: Diagnosis of  By: Radene Ou MD, [place]     Renal disorder    Sees someone [facility]   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   ROTATOR CUFF SYNDROME, 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   Qualifier: Diagnosis of  By: Radene Ou MD, [place] Hawks     SUBACROMIAL BURSITIS, 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   Qualifier: Diagnosis of  By: Radene Ou MD, [place]     Type 2 diabetes mellitus with diabetic chronic kidney disease ([place]) 06/13/2018   Wears glasses  Past Surgical History:  Procedure Laterality Date   25 GAUGE PARS PLANA VITRECTOMY WITH 20 GAUGE MVR PORT FOR MACULAR HOLE Right 06/14/2014   Procedure: 25 GAUGE PARS PLANA VITRECTOMY WITH 20 GAUGE MVR PORT FOR MACULAR HOLE; serum patch; gas injection; laser treament; C3F8 gas fluid exchange.;  Surgeon: Hayden Pedro, MD;  Location: [place];  Service: Ophthalmology;  Laterality: Right;   ABDOMINAL HYSTERECTOMY     EYE SURGERY     GANGLION CYST EXCISION Left 02/02/2015   Procedure: EXCISION OF VOLAR GANGLION CYST LEFT WRIST;  Surgeon: Leanora Cover, MD;  Location: [place];  Service: Orthopedics;  Laterality: Left;   GAS INSERTION  Right 07/19/2014   Procedure: INSERTION OF GAS;  Surgeon: Hayden Pedro, MD;  Location: [place];  Service: Ophthalmology;  Laterality: Right;  C3F8   LASER PHOTO ABLATION Right 07/19/2014   Procedure: LASER PHOTO ABLATION;  Surgeon: Hayden Pedro, MD;  Location: [place];  Service: Ophthalmology;  Laterality: Right;   PARS PLANA VITRECTOMY W/ REPAIR OF MACULAR HOLE Right 06/14/2014   DR MATTHEWS   SCLERAL BUCKLE Right 07/19/2014   Procedure: SCLERAL BUCKLE;  Surgeon: Hayden Pedro, MD;  Location: [place];  Service: Ophthalmology;  Laterality: Right;   Family History  Problem Relation Age of Onset   Hypertension Sister    Social History   Socioeconomic History   Marital status: Divorced    Spouse name: Not on file   Number of children: 1   Years of education: 9   Highest education level: 9th grade  Occupational History   Not on file  Tobacco Use   Smoking status: Former    Packs/day: 0.25    Years: 3.00    Pack years: 0.75    Types: Cigarettes    Quit date: 12/09/1993    Years since quitting: 27.5   Smokeless tobacco: Never  Substance and Sexual Activity   Alcohol use: No   Drug use: No   Sexual activity: Not Currently  Other Topics Concern   Not on file  Social History Narrative   Social Information ( 06/07/21)   patient lives with husband who is blind and step son  . Patient enjoys watching TV and spends most of her time in bed . Primary family support person is sister [name] who lives near by, support is limited as sister also has health concerns and caring for her minor grandchildren.    Transportation to appointments provided by sister [name];        Basic Activities of Daily Living per sister Marland Mcalpine   Dressing:Self-care   Eating: Self-care   Ambulation: Partial assistance ( uses a walker or cane)   Toileting: Self-care   Bathing: Self-care       Instrumental Activities of Daily Living   Shopping: Total assistance   House/Yard Work: Psychologist, counselling of medications: Partial assistance   Finances: Total assistance   Telephone: Partial assistance   Transportation: Total assistance   Social Determinants of Health   Financial Resource Strain: Low Risk    Difficulty of Paying Living Expenses: Not hard at all  Food Insecurity: No Food Insecurity   Worried About Charity fundraiser in the Last Year: Never true   [place] in the Last Year: Never true  Transportation Needs: No Transportation Needs   Lack of Transportation (Medical): No   Lack of Transportation (Non-Medical): No  Physical Activity: Insufficiently Active   Days of  Exercise per Week: 2 days   Minutes of Exercise per Session: 30 min  Stress: No Stress Concern Present   Feeling of Stress : Only a little  Social Connections: Moderately Isolated   Frequency of Communication with Friends and Family: More than three times a week   Frequency of Social Gatherings with Friends and Family: More than three times a week   Attends Religious Services: More than 4 times per year   Active Member of [employer] or Organizations: No   Attends Archivist Meetings: Never   Marital Status: Divorced   Outpatient Encounter Medications as of 07/03/2021  Medication Sig   allopurinol (ZYLOPRIM) 100 MG tablet Take 100 mg by mouth daily.   amLODipine (NORVASC) 10 MG tablet Take 1 tablet by mouth daily.   atorvastatin (LIPITOR) 10 MG tablet Take 1 tablet by mouth at bedtime.   cloNIDine (CATAPRES) 0.2 MG tablet Take 0.2 mg by mouth daily.   donepezil (ARICEPT) 10 MG tablet TAKE 1 TABLET(10 MG) BY MOUTH DAILY   cloNIDine (CATAPRES - DOSED IN MG/24 HR) 0.2 mg/24hr patch Place 1 patch (0.2 mg total) onto the skin once a week. (Patient not taking: No sig reported)   No facility-administered encounter medications on file as of 07/03/2021.   Activities of Daily Living In your present state of health, do you have any difficulty performing the following activities: 07/03/2021   Hearing? N  Vision? N  Difficulty concentrating or making decisions? Y  Walking or climbing stairs? Y  Dressing or bathing? N  Doing errands, shopping? Y  Preparing Food and eating ? N  Using the Toilet? N  In the past six months, have you accidently leaked urine? N  Do you have problems with loss of bowel control? N  Managing your Medications? Y  Managing your Finances? Y  Housekeeping or managing your Housekeeping? N  Some recent data might be hidden   Patient Care Team: [name], MD as PCP - General (Family Medicine) [name], LCSW as Social Worker (Licensed Clinical Social Worker)    Assessment:   This is a routine wellness examination for [place].  Exercise Activities and Dietary recommendations Current Exercise Habits: Home exercise routine, Type of exercise: Other - see comments (Home Health PT), Time (Minutes): 30, Frequency (Times/Week): 2, Weekly Exercise (Minutes/Week): 60, Exercise limited by: psychological condition(s)   Goals      Effective Long-Term Care Planning     Patient Goals/Self-Care Activities :  Complete Advance Directive packet,  Have advance directive notarized and provide a copy to provider office Call LCSW if you have questions          Fall Risk Fall Risk  07/03/2021 06/14/2021 05/08/2021 04/17/2021  Falls in the past year? 0 0 0 0  Number falls in past yr: - 0 0 -  Risk for fall due to : Mental status change - - -  Follow up Falls prevention discussed - - -   Is the patient's home free of loose throw rugs in walkways, pet beds, electrical cords, etc?   yes      Grab bars in the bathroom? yes      Handrails on the stairs?   yes      Adequate lighting?   yes  Patient rating of health (0-10) scale: 8   Depression Screen PHQ 2/9 Scores 07/03/2021 06/14/2021 05/08/2021 04/17/2021  PHQ - 2 Score 2 2 2 2   PHQ- 9 Score 12 17 14  13  Cognitive Function Montreal Cognitive Assessment  06/18/2021  Visuospatial/ Executive (0/5) 0   Naming (0/3) 1  Attention: Read list of digits (0/2) 0  Attention: Read list of letters (0/1) 1  Attention: Serial 7 subtraction starting at 100 (0/3) 0  Language: Repeat phrase (0/2) 0  Language : Fluency (0/1) 0  Abstraction (0/2) 0  Delayed Recall (0/5) 0  Orientation (0/6) 2  Total 4  Adjusted Score (based on education) 5   6CIT Screen 07/03/2021  What Year? 4 points  What month? 3 points  What time? 0 points  Count back from 20 0 points  Months in reverse 4 points  Repeat phrase 4 points  Total Score 15   Immunization History  Administered Date(s) Administered   Influenza Whole 12/25/2007   PFIZER Comirnaty(Gray Top)Covid-19 Tri-Sucrose Vaccine 04/17/2021, 05/08/2021   Pneumococcal Polysaccharide-23 12/10/2004   Td 12/09/1996, 08/11/2007   Screening Tests Health Maintenance  Topic Date Due   FOOT EXAM  Never done   OPHTHALMOLOGY EXAM  Never done   Hepatitis C Screening  Never done   Zoster Vaccines- Shingrix (1 of 2) Never done   PNA vac Low Risk Adult (1 of 2 - PCV13) 09/15/2006   TETANUS/TDAP  08/10/2017   HEMOGLOBIN A1C  12/14/2018   INFLUENZA VACCINE  07/09/2021   COVID-19 Vaccine (3 - Booster for Pfizer series) 10/08/2021   DEXA SCAN  Completed   HPV VACCINES  Aged Out   Cancer Screenings: Lung: Low Dose CT Chest recommended if Age 33-80 years, 30 pack-year currently smoking OR have quit w/in 15years. Patient does not qualify. Breast:  Up to date on Mammogram? No  last done 2016 Up to date of Bone Density/Dexa? Yes Colorectal: 2012  Plan:  PCP apt scheduled for 08/07/2021 at 10:00am. Go to [facility] and pick up a knee brace. You are due for a booster vaccine on 10/08/2021. We will have Flu vaccines available by the end of August. Bring back completed advance directive.  I have personally reviewed and noted the following in the patient's chart:   Medical and social history Use of alcohol, tobacco or illicit drugs  Current medications and  supplements Functional ability and status Nutritional status Physical activity Advanced directives List of other physicians Hospitalizations, surgeries, and ER visits in previous 12 months Vitals Screenings to include cognitive, depression, and falls Referrals and appointments  In addition, I have reviewed and discussed with patient certain preventive protocols, quality metrics, and best practice recommendations. A written personalized care plan for preventive services as well as general preventive health recommendations were provided to patient.  Dorna Bloom, [place]  07/03/2021

## 2021-07-03 NOTE — Patient Instructions (Signed)
You spoke to Megan Stevenson, [place] for your annual wellness visit.  We discussed goals:   Goals      Effective Long-Term Care Planning     Patient Goals/Self-Care Activities :  Complete Advance Directive packet,  Have advance directive notarized and provide a copy to provider office Call LCSW if you have questions          We also discussed recommended health maintenance. As discussed, you are due for: Health Maintenance  Topic Date Due   FOOT EXAM  Never done   OPHTHALMOLOGY EXAM  Never done   Hepatitis C Screening  Never done   Zoster Vaccines- Shingrix (1 of 2) Never done   PNA vac Low Risk Adult (1 of 2 - PCV13) 09/15/2006   TETANUS/TDAP  08/10/2017   HEMOGLOBIN A1C  12/14/2018   INFLUENZA VACCINE  07/09/2021   COVID-19 Vaccine (3 - Booster for Pfizer series) 10/08/2021   DEXA SCAN  Completed   HPV VACCINES  Aged Out   PCP apt scheduled for 08/07/2021 at 10:00am. Go to [facility] and pick up a knee brace. You are due for a booster vaccine on 10/08/2021. We will have Flu vaccines available by the end of August. Bring back completed advance directive.  Health Maintenance, Female Adopting a healthy lifestyle and getting preventive care are important in promoting health and wellness. Ask your health care provider about: The right schedule for you to have regular tests and exams. Things you can do on your own to prevent diseases and keep yourself healthy. What should I know about diet, weight, and exercise? Eat a healthy diet  Eat a diet that includes plenty of vegetables, fruits, low-fat dairy products, and lean protein. Do not eat a lot of foods that are high in solid fats, added sugars, or sodium.  Maintain a healthy weight Body mass index (BMI) is used to identify weight problems. It estimates body fat based on height and weight. Your health care provider can help determineyour BMI and help you achieve or maintain a healthy weight. Get regular exercise Get regular  exercise. This is one of the most important things you can do for your health. Most adults should: Exercise for at least 150 minutes each week. The exercise should increase your heart rate and make you sweat (moderate-intensity exercise). Do strengthening exercises at least twice a week. This is in addition to the moderate-intensity exercise. Spend less time sitting. Even light physical activity can be beneficial. Watch cholesterol and blood lipids Have your blood tested for lipids and cholesterol at 80 years of age, then havethis test every 5 years. Have your cholesterol levels checked more often if: Your lipid or cholesterol levels are high. You are older than 80 years of age. You are at high risk for heart disease. What should I know about cancer screening? Depending on your health history and family history, you may need to have cancer screening at various ages. This may include screening for: Breast cancer. Cervical cancer. Colorectal cancer. Skin cancer. Lung cancer. What should I know about heart disease, diabetes, and high blood pressure? Blood pressure and heart disease High blood pressure causes heart disease and increases the risk of stroke. This is more likely to develop in people who have high blood pressure readings, are of African descent, or are overweight. Have your blood pressure checked: Every 3-5 years if you are 7-27 years of age. Every year if you are 25 years old or older. Diabetes Have regular diabetes screenings. This  checks your fasting blood sugar level. Have the screening done: Once every three years after age 78 if you are at a normal weight and have a low risk for diabetes. More often and at a younger age if you are overweight or have a high risk for diabetes. What should I know about preventing infection? Hepatitis B If you have a higher risk for hepatitis B, you should be screened for this virus. Talk with your health care provider to find out if you are  at risk forhepatitis B infection. Hepatitis C Testing is recommended for: Everyone born from [birth year] through 1965. Anyone with known risk factors for hepatitis C. Sexually transmitted infections (STIs) Get screened for STIs, including gonorrhea and chlamydia, if: You are sexually active and are younger than 80 years of age. You are older than 80 years of age and your health care provider tells you that you are at risk for this type of infection. Your sexual activity has changed since you were last screened, and you are at increased risk for chlamydia or gonorrhea. Ask your health care provider if you are at risk. Ask your health care provider about whether you are at high risk for HIV. Your health care provider may recommend a prescription medicine to help prevent HIV infection. If you choose to take medicine to prevent HIV, you should first get tested for HIV. You should then be tested every 3 months for as long as you are taking the medicine. Pregnancy If you are about to stop having your period (premenopausal) and you may become pregnant, seek counseling before you get pregnant. Take 400 to 800 micrograms (mcg) of folic acid every day if you become pregnant. Ask for birth control (contraception) if you want to prevent pregnancy. Osteoporosis and menopause Osteoporosis is a disease in which the bones lose minerals and strength with aging. This can result in bone fractures. If you are 65 years old or older, or if you are at risk for osteoporosis and fractures, ask your health care provider if you should: Be screened for bone loss. Take a calcium or vitamin D supplement to lower your risk of fractures. Be given hormone replacement therapy (HRT) to treat symptoms of menopause. Follow these instructions at home: Lifestyle Do not use any products that contain nicotine or tobacco, such as cigarettes, e-cigarettes, and chewing tobacco. If you need help quitting, ask your health care provider. Do not  use street drugs. Do not share needles. Ask your health care provider for help if you need support or information about quitting drugs. Alcohol use Do not drink alcohol if: Your health care provider tells you not to drink. You are pregnant, may be pregnant, or are planning to become pregnant. If you drink alcohol: Limit how much you use to 0-1 drink a day. Limit intake if you are breastfeeding. Be aware of how much alcohol is in your drink. In the [place] one drink equals one 12 oz bottle of beer (355 mL), one 5 oz glass of wine (148 mL), or one 1 oz glass of hard liquor (44 mL). General instructions Schedule regular health, dental, and eye exams. Stay current with your vaccines. Tell your health care provider if: You often feel depressed. You have ever been abused or do not feel safe at home. Summary Adopting a healthy lifestyle and getting preventive care are important in promoting health and wellness. Follow your health care provider's instructions about healthy diet, exercising, and getting tested or screened for diseases. Follow your health  care provider's instructions on monitoring your cholesterol and blood pressure. This information is not intended to replace advice given to you by your health care provider. Make sure you discuss any questions you have with your healthcare provider. Document Revised: 11/18/2018 Document Reviewed: 11/18/2018 Elsevier Patient Education  2022 [employer].    Our clinic's number is [phone]. Please call with questions or concerns about what we discussed today.

## 2021-07-04 ENCOUNTER — Telehealth: Payer: Self-pay

## 2021-07-04 DIAGNOSIS — M1711 Unilateral primary osteoarthritis, right knee: Secondary | ICD-10-CM | POA: Diagnosis not present

## 2021-07-04 DIAGNOSIS — E1122 Type 2 diabetes mellitus with diabetic chronic kidney disease: Secondary | ICD-10-CM | POA: Diagnosis not present

## 2021-07-04 DIAGNOSIS — E1159 Type 2 diabetes mellitus with other circulatory complications: Secondary | ICD-10-CM | POA: Diagnosis not present

## 2021-07-04 DIAGNOSIS — F0390 Unspecified dementia without behavioral disturbance: Secondary | ICD-10-CM

## 2021-07-04 DIAGNOSIS — E1169 Type 2 diabetes mellitus with other specified complication: Secondary | ICD-10-CM | POA: Diagnosis not present

## 2021-07-04 DIAGNOSIS — I152 Hypertension secondary to endocrine disorders: Secondary | ICD-10-CM | POA: Diagnosis not present

## 2021-07-04 DIAGNOSIS — N184 Chronic kidney disease, stage 4 (severe): Secondary | ICD-10-CM | POA: Diagnosis not present

## 2021-07-04 NOTE — Progress Notes (Signed)
I have reviewed this visit and agree with the documentation.   

## 2021-07-04 NOTE — Telephone Encounter (Signed)
Jalene Mullet, PT with [facility] calls nurse line regarding patient having elevated BP.   BP at visit today was 180/70, HR 48. Patient was not symptomatic at time of visit.   PT reports concerns with medication compliance.   Attempted to call patient. No answer, left HIPAA compliant VM for patient to return call to office.   Spoke with clinical pharmacist, Ernst Bowler, to see if patient would be a good candidate for an appointment with her. Patient has transportation issues. Recommended possible home health involvement in medication management.   Called [name] back. Asked if patient was already receiving home health nursing. Patient is not receiving nursing at this time. PT could not take verbal order for nursing. Per protocol, once home health services have been ordered, nursing can provide orders for additional disciplines. Placed order for home health nursing evaluation. Community message sent to Candi Leash at [facility].   Will await response.   FYI to PCP.   Talbot Grumbling, RN

## 2021-07-09 DIAGNOSIS — E1159 Type 2 diabetes mellitus with other circulatory complications: Secondary | ICD-10-CM | POA: Diagnosis not present

## 2021-07-09 DIAGNOSIS — I152 Hypertension secondary to endocrine disorders: Secondary | ICD-10-CM | POA: Diagnosis not present

## 2021-07-09 DIAGNOSIS — E1122 Type 2 diabetes mellitus with diabetic chronic kidney disease: Secondary | ICD-10-CM | POA: Diagnosis not present

## 2021-07-09 DIAGNOSIS — N184 Chronic kidney disease, stage 4 (severe): Secondary | ICD-10-CM | POA: Diagnosis not present

## 2021-07-09 DIAGNOSIS — M1711 Unilateral primary osteoarthritis, right knee: Secondary | ICD-10-CM | POA: Diagnosis not present

## 2021-07-09 DIAGNOSIS — E1169 Type 2 diabetes mellitus with other specified complication: Secondary | ICD-10-CM | POA: Diagnosis not present

## 2021-07-17 DIAGNOSIS — I152 Hypertension secondary to endocrine disorders: Secondary | ICD-10-CM | POA: Diagnosis not present

## 2021-07-17 DIAGNOSIS — E1159 Type 2 diabetes mellitus with other circulatory complications: Secondary | ICD-10-CM | POA: Diagnosis not present

## 2021-07-17 DIAGNOSIS — E1169 Type 2 diabetes mellitus with other specified complication: Secondary | ICD-10-CM | POA: Diagnosis not present

## 2021-07-17 DIAGNOSIS — E1122 Type 2 diabetes mellitus with diabetic chronic kidney disease: Secondary | ICD-10-CM | POA: Diagnosis not present

## 2021-07-17 DIAGNOSIS — M1711 Unilateral primary osteoarthritis, right knee: Secondary | ICD-10-CM | POA: Diagnosis not present

## 2021-07-17 DIAGNOSIS — N184 Chronic kidney disease, stage 4 (severe): Secondary | ICD-10-CM | POA: Diagnosis not present

## 2021-07-19 DIAGNOSIS — E785 Hyperlipidemia, unspecified: Secondary | ICD-10-CM | POA: Diagnosis not present

## 2021-07-19 DIAGNOSIS — N184 Chronic kidney disease, stage 4 (severe): Secondary | ICD-10-CM | POA: Diagnosis not present

## 2021-07-19 DIAGNOSIS — Z87891 Personal history of nicotine dependence: Secondary | ICD-10-CM | POA: Diagnosis not present

## 2021-07-19 DIAGNOSIS — I152 Hypertension secondary to endocrine disorders: Secondary | ICD-10-CM | POA: Diagnosis not present

## 2021-07-19 DIAGNOSIS — M6284 Sarcopenia: Secondary | ICD-10-CM | POA: Diagnosis not present

## 2021-07-19 DIAGNOSIS — M1711 Unilateral primary osteoarthritis, right knee: Secondary | ICD-10-CM | POA: Diagnosis not present

## 2021-07-19 DIAGNOSIS — F0390 Unspecified dementia without behavioral disturbance: Secondary | ICD-10-CM | POA: Diagnosis not present

## 2021-07-19 DIAGNOSIS — Z8673 Personal history of transient ischemic attack (TIA), and cerebral infarction without residual deficits: Secondary | ICD-10-CM | POA: Diagnosis not present

## 2021-07-19 DIAGNOSIS — E1122 Type 2 diabetes mellitus with diabetic chronic kidney disease: Secondary | ICD-10-CM | POA: Diagnosis not present

## 2021-07-19 DIAGNOSIS — M109 Gout, unspecified: Secondary | ICD-10-CM | POA: Diagnosis not present

## 2021-07-19 DIAGNOSIS — E1159 Type 2 diabetes mellitus with other circulatory complications: Secondary | ICD-10-CM | POA: Diagnosis not present

## 2021-07-19 DIAGNOSIS — E1169 Type 2 diabetes mellitus with other specified complication: Secondary | ICD-10-CM | POA: Diagnosis not present

## 2021-07-20 DIAGNOSIS — M1711 Unilateral primary osteoarthritis, right knee: Secondary | ICD-10-CM | POA: Diagnosis not present

## 2021-07-20 DIAGNOSIS — E1169 Type 2 diabetes mellitus with other specified complication: Secondary | ICD-10-CM | POA: Diagnosis not present

## 2021-07-20 DIAGNOSIS — E1122 Type 2 diabetes mellitus with diabetic chronic kidney disease: Secondary | ICD-10-CM | POA: Diagnosis not present

## 2021-07-20 DIAGNOSIS — I152 Hypertension secondary to endocrine disorders: Secondary | ICD-10-CM | POA: Diagnosis not present

## 2021-07-20 DIAGNOSIS — E1159 Type 2 diabetes mellitus with other circulatory complications: Secondary | ICD-10-CM | POA: Diagnosis not present

## 2021-07-20 DIAGNOSIS — N184 Chronic kidney disease, stage 4 (severe): Secondary | ICD-10-CM | POA: Diagnosis not present

## 2021-07-24 ENCOUNTER — Telehealth: Payer: Self-pay

## 2021-07-24 NOTE — Telephone Encounter (Signed)
Patients sister calls nurse line requesting PCP send in something for knee pain. Sister reports the patient has been doing a lot of PT and her knee has really been bothering her. Sister denies any new traumas. Sister reports she was using a brace, however PT told her not to wear it. Patient has an apt with PCP on 8/30. Please advise.

## 2021-07-26 DIAGNOSIS — M1711 Unilateral primary osteoarthritis, right knee: Secondary | ICD-10-CM | POA: Diagnosis not present

## 2021-07-26 DIAGNOSIS — E1169 Type 2 diabetes mellitus with other specified complication: Secondary | ICD-10-CM | POA: Diagnosis not present

## 2021-07-26 DIAGNOSIS — E1122 Type 2 diabetes mellitus with diabetic chronic kidney disease: Secondary | ICD-10-CM | POA: Diagnosis not present

## 2021-07-26 DIAGNOSIS — E1159 Type 2 diabetes mellitus with other circulatory complications: Secondary | ICD-10-CM | POA: Diagnosis not present

## 2021-07-26 DIAGNOSIS — N184 Chronic kidney disease, stage 4 (severe): Secondary | ICD-10-CM | POA: Diagnosis not present

## 2021-07-26 DIAGNOSIS — I152 Hypertension secondary to endocrine disorders: Secondary | ICD-10-CM | POA: Diagnosis not present

## 2021-07-30 NOTE — Telephone Encounter (Signed)
Attempted to call patient home phone, no answer and HIPAA compliant voicemail was left. If patient desires, we can send in a prescription for Voltaren gel. 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    Maurissa Ambrose, DO

## 2021-08-02 ENCOUNTER — Ambulatory Visit (INDEPENDENT_AMBULATORY_CARE_PROVIDER_SITE_OTHER): Payer: Medicare Other

## 2021-08-02 ENCOUNTER — Ambulatory Visit (HOSPITAL_COMMUNITY)
Admission: EM | Admit: 2021-08-02 | Discharge: 2021-08-02 | Disposition: A | Discharge status: Home / Self Care | Payer: Medicare Other | Attending: Internal Medicine | Admitting: Internal Medicine

## 2021-08-02 ENCOUNTER — Other Ambulatory Visit: Payer: Self-pay

## 2021-08-02 ENCOUNTER — Encounter (HOSPITAL_COMMUNITY): Payer: Self-pay | Admitting: Emergency Medicine

## 2021-08-02 DIAGNOSIS — I10 Essential (primary) hypertension: Secondary | ICD-10-CM

## 2021-08-02 DIAGNOSIS — R600 Localized edema: Secondary | ICD-10-CM | POA: Diagnosis not present

## 2021-08-02 DIAGNOSIS — S50852A Superficial foreign body of left forearm, initial encounter: Secondary | ICD-10-CM | POA: Diagnosis not present

## 2021-08-02 DIAGNOSIS — Z23 Encounter for immunization: Secondary | ICD-10-CM

## 2021-08-02 DIAGNOSIS — S51822A Laceration with foreign body of left forearm, initial encounter: Secondary | ICD-10-CM

## 2021-08-02 MED ORDER — LIDOCAINE HCL (PF) 1 % IJ SOLN
INTRAMUSCULAR | Status: AC
  Filled 2021-08-02: qty 30

## 2021-08-02 MED ORDER — MUPIROCIN CALCIUM 2 % EX CREA: 1.0000 "application " | TOPICAL_CREAM | Freq: Two times a day (BID) | CUTANEOUS | 0 refills | Status: DC

## 2021-08-02 MED ORDER — TETANUS-DIPHTH-ACELL PERTUSSIS 5-2.5-18.5 LF-MCG/0.5 IM SUSY
PREFILLED_SYRINGE | INTRAMUSCULAR | Status: AC
  Filled 2021-08-02: qty 0.5

## 2021-08-02 MED ORDER — TETANUS-DIPHTH-ACELL PERTUSSIS 5-2.5-18.5 LF-MCG/0.5 IM SUSY
0.5000 mL | PREFILLED_SYRINGE | Freq: Once | INTRAMUSCULAR | Status: AC
  Administered 2021-08-02: 0.5 mL via INTRAMUSCULAR

## 2021-08-02 MED ORDER — LIDOCAINE-EPINEPHRINE 1 %-1:100000 IJ SOLN
INTRAMUSCULAR | Status: AC
  Filled 2021-08-02: qty 1

## 2021-08-02 NOTE — ED Provider Notes (Signed)
Megan Stevenson    CSN: [id number] Arrival date & time: 08/02/21  1609      History   Chief Complaint Chief Complaint  Patient presents with   Foreign Body in Skin    HPI Megan Stevenson is a 80 y.o. female who presents with FB in L forearm. She states she was using a safety pen to remove a splinter and the whole piece of one side broke off when she tried removing it, but denies the safety pin being inside her arm. This happed about 1h ago when she was trying to lift a furniture and the wood came from there. She does not recall her last TD.   2- pt states she took off her BP and cholesterol meds because she kept on forgetting to fill them and take them.   Past Medical History:  Diagnosis Date   Anemia    Arthritis    Ataxia 06/13/2018   Bradycardia 06/13/2018   Bradycardia, sinus 06/13/2018   CKD (chronic kidney disease) stage 4, GFR 15-29 ml/min (HCC) 06/13/2018   Cognitive decline    COLONIC POLYPS, HX OF 07/24/2007   Annotation: 6/06 Qualifier: History of  By: Radene Ou MD, [place]     Diabetes mellitus without complication ([place])    no medications   DIVERTICULOSIS, SIGMOID COLON 07/24/2007   Qualifier: Diagnosis of  By: Radene Ou MD, [place]     Dysrhythmia    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   Qualifier: Diagnosis of  By: Radene Ou MD, [place]     Hyperlipidemia associated with type 2 diabetes mellitus ([place]) 07/24/2007   Qualifier: Diagnosis of  By: Radene Ou MD, [place]     Hypertension    Hypertension associated with diabetes ([place]) 07/24/2007   Qualifier: Diagnosis of  By: Radene Ou MD, [place]     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   Brain MRI 06/13/2018: Small deep white matter and basal ganglia lacunar infarcts   Macular hole    OD   Macular hole, 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   Metatarsal fracture, Right foot 08/25/2020   Osteoarthritis of right knee 04/17/2021   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/24/2007   Annotation: 10/97 Qualifier: Diagnosis of  By: Radene Ou MD, [place]     Renal disorder    Sees  someone [facility]   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   ROTATOR CUFF SYNDROME, 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   Qualifier: Diagnosis of  By: Radene Ou MD, [place] Hawks     SUBACROMIAL BURSITIS, 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   Qualifier: Diagnosis of  By: Radene Ou MD, [place]     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   Wears glasses     Patient Active Problem List   Diagnosis Date Noted   Hearing screen with abnormal findings 06/18/2021   At risk for falls 06/18/2021   Sarcopenia 06/18/2021   Impaired functional mobility, balance, gait, and endurance 06/18/2021   Dementia ([place]) 04/17/2021   Osteoarthritis of both knees 04/17/2021   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   CKD (chronic kidney disease) stage 4, GFR 15-29 ml/min (HCC) 06/13/2018   Microcytic anemia 06/13/2018   Heart murmur 06/13/2018   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   Macular hole, 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   OSTEOARTHRITIS, 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   Hyperlipidemia associated with type 2 diabetes mellitus ([place]) 07/24/2007   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   Hypertension associated with diabetes ([place]) 07/24/2007    Past Surgical History:  Procedure Laterality Date   25 GAUGE PARS PLANA VITRECTOMY  WITH 20 GAUGE MVR PORT FOR MACULAR HOLE Right 06/14/2014   Procedure: 25 GAUGE PARS PLANA VITRECTOMY WITH 20 GAUGE MVR PORT FOR MACULAR HOLE; serum patch; gas injection; laser treament; C3F8 gas fluid exchange.;  Surgeon: Hayden Pedro, MD;  Location: [place];  Service: Ophthalmology;  Laterality: Right;   ABDOMINAL HYSTERECTOMY     EYE SURGERY     GANGLION CYST EXCISION Left 02/02/2015   Procedure: EXCISION OF VOLAR GANGLION CYST LEFT WRIST;  Surgeon: Leanora Cover, MD;  Location: [place];  Service: Orthopedics;  Laterality: Left;   GAS INSERTION Right 07/19/2014   Procedure: INSERTION OF GAS;  Surgeon: Hayden Pedro, MD;   Location: [place];  Service: Ophthalmology;  Laterality: Right;  C3F8   LASER PHOTO ABLATION Right 07/19/2014   Procedure: LASER PHOTO ABLATION;  Surgeon: Hayden Pedro, MD;  Location: [place];  Service: Ophthalmology;  Laterality: Right;   PARS PLANA VITRECTOMY W/ REPAIR OF MACULAR HOLE Right 06/14/2014   DR MATTHEWS   SCLERAL BUCKLE Right 07/19/2014   Procedure: SCLERAL BUCKLE;  Surgeon: Hayden Pedro, MD;  Location: [place];  Service: Ophthalmology;  Laterality: Right;    OB History   No obstetric history on file.      Home Medications    Prior to Admission medications   Medication Sig Start Date End Date Taking? Authorizing Provider  mupirocin cream (BACTROBAN) 2 % Apply 1 application topically 2 (two) times daily. 08/02/21  Yes Rodriguez-Southworth, Sunday Spillers, PA-C    Family History Family History  Problem Relation Age of Onset   Healthy Mother    Healthy Father    Hypertension Sister     Social History Social History   Tobacco Use   Smoking status: Former    Packs/day: 0.25    Years: 3.00    Pack years: 0.75    Types: Cigarettes    Quit date: 12/09/1993    Years since quitting: 27.6   Smokeless tobacco: Never  Vaping Use   Vaping Use: Never used  Substance Use Topics   Alcohol use: No   Drug use: No     Allergies   Nsaids, Benazepril hcl, Furosemide, and Atenolol   Review of Systems Review of Systems + FB L forearm. Denies HA, or CP    Physical Exam Triage Vital Signs ED Triage Vitals  Enc Vitals Group     BP 08/02/21 1618 (!) 160/64     Pulse Rate 08/02/21 1618 79     Resp --      Temp 08/02/21 1618 98.7 F (37.1 C)     Temp Source 08/02/21 1618 Oral     SpO2 08/02/21 1618 100 %     Weight --      Height --      Head Circumference --      Peak Flow --      Pain Score 08/02/21 1629 0     Pain Loc --      Pain Edu? --      Excl. in [place]? --    No data found.  Updated Vital Signs BP (!) 169/50 (BP Location: Right Arm)   Pulse 73   Temp 98.6  F (37 C) (Oral)   Resp 20   SpO2 100%   Visual Acuity Right Eye Distance:   Left Eye Distance:   Bilateral Distance:    Right Eye Near:   Left Eye Near:    Bilateral Near:     Physical  Exam Vitals and nursing note reviewed.  Constitutional:      General: She is not in acute distress.    Appearance: She is normal weight.  HENT:     Right Ear: External ear normal.     Left Ear: External ear normal.  Eyes:     General: No scleral icterus.    Conjunctiva/sclera: Conjunctivae normal.  Pulmonary:     Effort: Pulmonary effort is normal.  Musculoskeletal:        General: Normal range of motion.     Cervical back: Neck supple.  Skin:    General: Skin is warm and dry.     Comments: L FOREARM- with 3 cm FB palpated on radial medial forearm.   Neurological:     Mental Status: She is alert and oriented to person, place, and time.     Gait: Gait normal.  Psychiatric:        Mood and Affect: Mood normal.        Behavior: Behavior normal.        Thought Content: Thought content normal.        Judgment: Judgment normal.     UC Treatments / Results  Labs (all labs ordered are listed, but only abnormal results are displayed) Labs Reviewed - No data to display  EKG   Radiology DG Forearm Left  Result Date: 08/02/2021 CLINICAL DATA:  "Foreign body (safety pen) broke in skin." Technologist notes state: Patient found a splinter in left forearm, tried to remove foreign body with a straight pen. Straight pen has broken off and patient's forearm. EXAM: LEFT FOREARM - 2 VIEW COMPARISON:  None. FINDINGS: Cortical margins of the radius and ulna are intact. There is no evidence of fracture or other focal bone lesions. Two tiny linear densities measuring 1-2 mm are noted in the area of clinical concern with adjacent soft tissue edema about the ulnar aspect of the mid forearm. No soft tissue air. IMPRESSION: Soft tissue edema with 2 tiny 1-2 mm densities in the soft tissues, may represent  faint foreign body. No radiopaque foreign body or osseous abnormality. Electronically Signed   By: Keith Rake M.D.   On: 08/02/2021 17:13    Procedures Foreign Body Removal  Date/Time: 08/02/2021 5:51 PM Performed by: Shelby Mattocks, PA-C Authorized by: Shelby Mattocks, PA-C   Consent:    Consent obtained:  Verbal   Consent given by:  Patient   Risks, benefits, and alternatives were discussed: yes     Risks discussed:  Bleeding, infection, pain and incomplete removal   Alternatives discussed:  Alternative treatment Universal protocol:    Procedure explained and questions answered to patient or proxy's satisfaction: yes     Patient identity confirmed:  Verbally with patient Location:    Location:  Arm   Arm location:  L forearm   Depth:  Subcutaneous   Tendon involvement:  None Pre-procedure details:    Imaging:  X-ray   Neurovascular status: intact     Preparation: Patient was prepped and draped in usual sterile fashion   Anesthesia:    Anesthesia method:  Local infiltration   Local anesthetic:  Lidocaine 1% WITH epi Procedure type:    Procedure complexity:  Complex Procedure details:    Incision length:  3 1/3 cm incisions   Localization method:  Probed   Dissection of underlying tissues: yes     Bloodless field: no     Removal mechanism:  Hemostat   Foreign bodies recovered:  2  Description:  Splinters   Intact foreign body removal: yes   Post-procedure details:    Neurovascular status: intact     Post-procedure assessment: no FB palpated. I irrigated the inside of the wound with 10 ml saline and catherter tip.   Skin closure:  None   Dressing:  Bulky dressing and antibiotic ointment   Procedure completion:  Tolerated well, no immediate complications Comments:     I had to  make 3 different incisions since part of it came out the first trial, but there was still a pice in the center.  Area was wrapped with coban  (including critical  care time)  Medications Ordered in UC Medications  Tdap (BOOSTRIX) injection 0.5 mL (0.5 mLs Intramuscular Given 08/02/21 1717)    Initial Impression / Assessment and Plan / UC Course  I have reviewed the triage vital signs and the nursing notes. Per her records her last TD was 2008. She was given TDAP Pertinent  imaging results that were available during my care of the patient were reviewed by me and considered in my medical decision making (see chart for details). Had FB of L forearm. Uncontrolled HTN. Wound care instructions written and explained to her. Advised to get back on her BP med and FU with PCP next week for wound check and BP check.  I sent Bactroban ointment to her pharmacy as noted.     Final Clinical Impressions(s) / UC Diagnoses   Final diagnoses:  Foreign body in left forearm, initial encounter  Uncontrolled hypertension     Discharge Instructions      Keep the bandage on til tomorrow evening. You may shower after that and wash the area with soap and water only twice a day tomorrow, then after that one time a day and apply the antibiotic ointment daily for 7 day to prevent an infection. In about 3 days ones you have a scab, you dont need to keep it covered.  Get back on your blood pressure medication if your top number is more than 139/ 90 Follow up with your family Dr next week.      ED Prescriptions     Medication Sig Dispense Auth. Provider   mupirocin cream (BACTROBAN) 2 % Apply 1 application topically 2 (two) times daily. 15 g Rodriguez-Southworth, Sunday Spillers, PA-C      PDMP not reviewed this encounter.   Shelby Mattocks, Hershal Coria 08/02/21 1806

## 2021-08-02 NOTE — ED Triage Notes (Signed)
Pient found a splinter in left forearm.  Patient tried to remove foreign body with a straight pen.  Straight pen has broken off in patients left forearm

## 2021-08-02 NOTE — Discharge Instructions (Addendum)
Keep the bandage on til tomorrow evening. You may shower after that and wash the area with soap and water only twice a day tomorrow, then after that one time a day and apply the antibiotic ointment daily for 7 day to prevent an infection. In about 3 days ones you have a scab, you dont need to keep it covered.  Get back on your blood pressure medication if your top number is more than 139/ 90 Follow up with your family Dr next week.

## 2021-08-07 ENCOUNTER — Other Ambulatory Visit: Payer: Self-pay

## 2021-08-07 ENCOUNTER — Ambulatory Visit (INDEPENDENT_AMBULATORY_CARE_PROVIDER_SITE_OTHER): Payer: Medicare Other | Admitting: Family Medicine

## 2021-08-07 VITALS — BP 125/70 | HR 68 | Ht 69.0 in | Wt 139.8 lb

## 2021-08-07 DIAGNOSIS — Z7409 Other reduced mobility: Secondary | ICD-10-CM | POA: Diagnosis not present

## 2021-08-07 DIAGNOSIS — F0390 Unspecified dementia without behavioral disturbance: Secondary | ICD-10-CM

## 2021-08-07 DIAGNOSIS — M170 Bilateral primary osteoarthritis of knee: Secondary | ICD-10-CM | POA: Diagnosis not present

## 2021-08-07 DIAGNOSIS — I6381 Other cerebral infarction due to occlusion or stenosis of small artery: Secondary | ICD-10-CM

## 2021-08-07 MED ORDER — DICLOFENAC SODIUM 1 % EX GEL: CUTANEOUS | 0 refills | Status: AC

## 2021-08-07 NOTE — Patient Instructions (Signed)
It was great to see you!  Things we discussed at today's visit: - For your knee pain, I have sent a cream to your pharmacy. You can use this 2-3 times per day as needed. Be sure to wash your hands thoroughly immediately after you apply the cream. -You can also continue taking Tylenol as needed. We are limited in other pain medication options due to her kidney function and age. -I think it's fine to use the knee brace you bought. If physical therapy says otherwise, please ask them their reasoning and we can discuss further. -I have placed a new order for home health physical therapy. Someone will reach out to you with details. -Our team will look into the shower chair/handle. We will call you with any questions or updates.  Take care and seek immediate care sooner if you develop any concerns.  Dr. [facility]

## 2021-08-07 NOTE — Progress Notes (Signed)
    SUBJECTIVE:   CHIEF COMPLAINT / HPI:   Right Knee Pain This has been a longstanding issue for her. She has known severe osteoarthritis. States the pain occurs most days.  Sister is more concerned than the patient. Sister states the patient walks "sideways"- wondering if there's any surgical option to fix this. The sister reports she briefly did physical therapy, but feels she could benefit from another round. She was using a brace that they bought at the pharmacy but PT reportedly told her to take it off. No recent falls. Patient doesn't like to use her walker (prefers to grab onto the wall/furniture).   They also report difficulty getting a shower chair and shower handle-- wondering if we can help facilitate this.  PERTINENT  PMH / PSH: Dementia, osteoarthritis, CKD Stage 4, T2DM, HLD  OBJECTIVE:   BP 125/70   Pulse 68   Ht 5\' 9"  (1.753 m)   Wt 139 lb 12.8 oz (63.4 kg)   SpO2 100%   BMI 20.64 kg/m   General: NAD, pleasant, able to participate in exam Respiratory: No respiratory distress Skin: warm and dry, no rashes noted Psych: Normal affect and mood Neuro: grossly intact. Gait is mildly unsteady, holds wall for stability MSK: significant valgus deviations of bilateral knees, no joint line tenderness, full ROM with flexion and extension of bilateral knees.   ASSESSMENT/PLAN:   Osteoarthritis of both knees Right worse than left. She has severe valgus deformities bilaterally which cause some impairment of her gait. Due to her dementia and overall health status, she is likely not a candidate for surgical intervention. Options for pain management are also limited by her age and comorbidities (mainly her renal function). Discussed steroid injections, but patient and her sister decline as this has not been helpful recently.  -Discussed that our main goal is fall prevention -Will order additional course of home health PT -Encouraged use of her walker -Can use OTC brace if she  finds it helpful -Tylenol as needed  Impaired functional mobility, balance, gait, and endurance DME orders placed for shower stool Will also re-connect patient with CCM (needs help coordinating installation of bathroom handle, overall patient and sister may need additional resources given her dementia, she would benefit from medication management ie pharmacy visit but has transportation limitations as well)   Med Rec Unable to perform adequate med rec at today's visit because neither patient nor her sister know the names of her medications. She does take several medications, although none are currently listed in her chart. Advised to bring ALL her meds to her next appt.   [name], MD [place]

## 2021-08-08 ENCOUNTER — Encounter: Payer: Self-pay | Admitting: Family Medicine

## 2021-08-08 NOTE — Assessment & Plan Note (Signed)
Right worse than left. She has severe valgus deformities bilaterally which cause some impairment of her gait. Due to her dementia and overall health status, she is likely not a candidate for surgical intervention. Options for pain management are also limited by her age and comorbidities (mainly her renal function). Discussed steroid injections, but patient and her sister decline as this has not been helpful recently.  -Discussed that our main goal is fall prevention -Will order additional course of home health PT -Encouraged use of her walker -Can use OTC brace if she finds it helpful -Tylenol as needed

## 2021-08-08 NOTE — Assessment & Plan Note (Addendum)
DME orders placed for shower stool Will also re-connect patient with CCM (needs help coordinating installation of bathroom handle, overall patient and sister may need additional resources given her dementia, she would benefit from medication management ie pharmacy visit but has transportation limitations as well)

## 2021-08-09 ENCOUNTER — Telehealth: Payer: Self-pay

## 2021-08-09 NOTE — Telephone Encounter (Signed)
Community message sent to [employer]. Will await response.   Kimora Stankovic C Lashunta Frieden, RN  

## 2021-08-09 NOTE — Telephone Encounter (Signed)
Please see below message from [employer].   Got This! Thank you!   Talbot Grumbling, RN

## 2021-08-14 ENCOUNTER — Telehealth: Payer: Self-pay | Admitting: *Deleted

## 2021-08-14 DIAGNOSIS — E785 Hyperlipidemia, unspecified: Secondary | ICD-10-CM | POA: Diagnosis not present

## 2021-08-14 DIAGNOSIS — Z8673 Personal history of transient ischemic attack (TIA), and cerebral infarction without residual deficits: Secondary | ICD-10-CM | POA: Diagnosis not present

## 2021-08-14 DIAGNOSIS — I152 Hypertension secondary to endocrine disorders: Secondary | ICD-10-CM | POA: Diagnosis not present

## 2021-08-14 DIAGNOSIS — F0390 Unspecified dementia without behavioral disturbance: Secondary | ICD-10-CM | POA: Diagnosis not present

## 2021-08-14 DIAGNOSIS — E1159 Type 2 diabetes mellitus with other circulatory complications: Secondary | ICD-10-CM | POA: Diagnosis not present

## 2021-08-14 DIAGNOSIS — D631 Anemia in chronic kidney disease: Secondary | ICD-10-CM | POA: Diagnosis not present

## 2021-08-14 DIAGNOSIS — M109 Gout, unspecified: Secondary | ICD-10-CM | POA: Diagnosis not present

## 2021-08-14 DIAGNOSIS — Z86010 Personal history of colonic polyps: Secondary | ICD-10-CM | POA: Diagnosis not present

## 2021-08-14 DIAGNOSIS — M170 Bilateral primary osteoarthritis of knee: Secondary | ICD-10-CM | POA: Diagnosis not present

## 2021-08-14 DIAGNOSIS — E1169 Type 2 diabetes mellitus with other specified complication: Secondary | ICD-10-CM | POA: Diagnosis not present

## 2021-08-14 DIAGNOSIS — K5730 Diverticulosis of large intestine without perforation or abscess without bleeding: Secondary | ICD-10-CM | POA: Diagnosis not present

## 2021-08-14 DIAGNOSIS — E1122 Type 2 diabetes mellitus with diabetic chronic kidney disease: Secondary | ICD-10-CM | POA: Diagnosis not present

## 2021-08-14 DIAGNOSIS — M6284 Sarcopenia: Secondary | ICD-10-CM | POA: Diagnosis not present

## 2021-08-14 DIAGNOSIS — Z87891 Personal history of nicotine dependence: Secondary | ICD-10-CM | POA: Diagnosis not present

## 2021-08-14 DIAGNOSIS — N184 Chronic kidney disease, stage 4 (severe): Secondary | ICD-10-CM | POA: Diagnosis not present

## 2021-08-14 DIAGNOSIS — Z8711 Personal history of peptic ulcer disease: Secondary | ICD-10-CM | POA: Diagnosis not present

## 2021-08-14 NOTE — Chronic Care Management (AMB) (Signed)
  Care Management   Outreach Note  08/14/2021 Name: Megan Stevenson MRN: [id number] DOB: 05-19-1941  Referred by: [name], MD Reason for referral : Care Coordination (Initial outreach to schedule referral with SW)   An unsuccessful telephone outreach was attempted today. The patient was referred to the case management team for assistance with care management and care coordination.   Follow Up Plan:  A HIPAA compliant phone message was left for the patient providing contact information and requesting a return call. The care management team will reach out to the patient again over the next 7 days.  If patient returns call to provider office, please advise to call Sandy  at [phone].  [facility]  Direct Dial: [phone]

## 2021-08-16 DIAGNOSIS — N184 Chronic kidney disease, stage 4 (severe): Secondary | ICD-10-CM | POA: Diagnosis not present

## 2021-08-16 DIAGNOSIS — M170 Bilateral primary osteoarthritis of knee: Secondary | ICD-10-CM | POA: Diagnosis not present

## 2021-08-16 DIAGNOSIS — F0390 Unspecified dementia without behavioral disturbance: Secondary | ICD-10-CM | POA: Diagnosis not present

## 2021-08-16 DIAGNOSIS — E1122 Type 2 diabetes mellitus with diabetic chronic kidney disease: Secondary | ICD-10-CM | POA: Diagnosis not present

## 2021-08-16 DIAGNOSIS — I152 Hypertension secondary to endocrine disorders: Secondary | ICD-10-CM | POA: Diagnosis not present

## 2021-08-16 DIAGNOSIS — E1159 Type 2 diabetes mellitus with other circulatory complications: Secondary | ICD-10-CM | POA: Diagnosis not present

## 2021-08-21 DIAGNOSIS — E1159 Type 2 diabetes mellitus with other circulatory complications: Secondary | ICD-10-CM | POA: Diagnosis not present

## 2021-08-21 DIAGNOSIS — E1122 Type 2 diabetes mellitus with diabetic chronic kidney disease: Secondary | ICD-10-CM | POA: Diagnosis not present

## 2021-08-21 DIAGNOSIS — N184 Chronic kidney disease, stage 4 (severe): Secondary | ICD-10-CM | POA: Diagnosis not present

## 2021-08-21 DIAGNOSIS — I152 Hypertension secondary to endocrine disorders: Secondary | ICD-10-CM | POA: Diagnosis not present

## 2021-08-21 DIAGNOSIS — F0390 Unspecified dementia without behavioral disturbance: Secondary | ICD-10-CM | POA: Diagnosis not present

## 2021-08-21 DIAGNOSIS — M170 Bilateral primary osteoarthritis of knee: Secondary | ICD-10-CM | POA: Diagnosis not present

## 2021-08-22 NOTE — Chronic Care Management (AMB) (Signed)
  Care Management   Outreach Note  08/22/2021 Name: Megan Stevenson MRN: [id number] DOB: 09-05-1941  Referred by: [name], MD Reason for referral : Care Coordination (Initial outreach to schedule referral with SW)   A second unsuccessful telephone outreach was attempted today. The patient was referred to the case management team for assistance with care management and care coordination.   Follow Up Plan:  A HIPAA compliant phone message was left for the patient providing contact information and requesting a return call. The care management team will reach out to the patient again over the next 7 days. If patient returns call to provider office, please advise to call [place] at [phone].  [facility] Management  Direct Dial: [phone]

## 2021-08-23 DIAGNOSIS — I152 Hypertension secondary to endocrine disorders: Secondary | ICD-10-CM | POA: Diagnosis not present

## 2021-08-23 DIAGNOSIS — M170 Bilateral primary osteoarthritis of knee: Secondary | ICD-10-CM | POA: Diagnosis not present

## 2021-08-23 DIAGNOSIS — F0390 Unspecified dementia without behavioral disturbance: Secondary | ICD-10-CM | POA: Diagnosis not present

## 2021-08-23 DIAGNOSIS — E1122 Type 2 diabetes mellitus with diabetic chronic kidney disease: Secondary | ICD-10-CM | POA: Diagnosis not present

## 2021-08-23 DIAGNOSIS — E1159 Type 2 diabetes mellitus with other circulatory complications: Secondary | ICD-10-CM | POA: Diagnosis not present

## 2021-08-23 DIAGNOSIS — N184 Chronic kidney disease, stage 4 (severe): Secondary | ICD-10-CM | POA: Diagnosis not present

## 2021-08-28 DIAGNOSIS — F0390 Unspecified dementia without behavioral disturbance: Secondary | ICD-10-CM | POA: Diagnosis not present

## 2021-08-28 DIAGNOSIS — E1159 Type 2 diabetes mellitus with other circulatory complications: Secondary | ICD-10-CM | POA: Diagnosis not present

## 2021-08-28 DIAGNOSIS — I152 Hypertension secondary to endocrine disorders: Secondary | ICD-10-CM | POA: Diagnosis not present

## 2021-08-28 DIAGNOSIS — N184 Chronic kidney disease, stage 4 (severe): Secondary | ICD-10-CM | POA: Diagnosis not present

## 2021-08-28 DIAGNOSIS — M170 Bilateral primary osteoarthritis of knee: Secondary | ICD-10-CM | POA: Diagnosis not present

## 2021-08-28 DIAGNOSIS — E1122 Type 2 diabetes mellitus with diabetic chronic kidney disease: Secondary | ICD-10-CM | POA: Diagnosis not present

## 2021-08-30 ENCOUNTER — Ambulatory Visit: Payer: Medicare Other | Admitting: Licensed Clinical Social Worker

## 2021-08-30 ENCOUNTER — Telehealth: Payer: Self-pay

## 2021-08-30 DIAGNOSIS — Z719 Counseling, unspecified: Secondary | ICD-10-CM

## 2021-08-30 DIAGNOSIS — I152 Hypertension secondary to endocrine disorders: Secondary | ICD-10-CM | POA: Diagnosis not present

## 2021-08-30 DIAGNOSIS — E1159 Type 2 diabetes mellitus with other circulatory complications: Secondary | ICD-10-CM | POA: Diagnosis not present

## 2021-08-30 DIAGNOSIS — E1122 Type 2 diabetes mellitus with diabetic chronic kidney disease: Secondary | ICD-10-CM | POA: Diagnosis not present

## 2021-08-30 DIAGNOSIS — F0390 Unspecified dementia without behavioral disturbance: Secondary | ICD-10-CM

## 2021-08-30 DIAGNOSIS — N184 Chronic kidney disease, stage 4 (severe): Secondary | ICD-10-CM | POA: Diagnosis not present

## 2021-08-30 DIAGNOSIS — M170 Bilateral primary osteoarthritis of knee: Secondary | ICD-10-CM | POA: Diagnosis not present

## 2021-08-30 DIAGNOSIS — Z7189 Other specified counseling: Secondary | ICD-10-CM

## 2021-08-30 NOTE — NC FL2 (Signed)
  [place] LEVEL OF CARE SCREENING TOOL     IDENTIFICATION  Patient Name: Megan Stevenson Birthdate: 04-01-1941 Sex: female Admission Date (Current Location): (Not on file)  [place] and [place] Number:  Kathleen Argue [id number] [place] and Address:         Provider Number: [id number]  Attending Physician Name and Address:  Dr. [name]  [address] [place] [address]  Relative Name and Phone Number:  Welford Roche  / Sister  [phone]    Current Level of Care: Home Recommended Level of Care: Nursing Facility Prior Approval Number:    Date Approved/Denied:   PASRR Number:    Discharge Plan:      Current Diagnoses: Patient Active Problem List   Diagnosis Date Noted   Hearing screen with abnormal findings 06/18/2021   At risk for falls 06/18/2021   Sarcopenia 06/18/2021   Impaired functional mobility, balance, gait, and endurance 06/18/2021   Dementia ([place]) 04/17/2021   Osteoarthritis of both knees 04/17/2021   Type 2 diabetes mellitus with diabetic chronic kidney disease ([place]) 06/13/2018   CKD (chronic kidney disease) stage 4, GFR 15-29 ml/min (HCC) 06/13/2018   Microcytic anemia 06/13/2018   Heart murmur 06/13/2018   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   Macular hole, 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   OSTEOARTHRITIS, 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   Hyperlipidemia associated with type 2 diabetes mellitus ([place]) 07/24/2007   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   Hypertension associated with diabetes ([place]) 07/24/2007    Orientation RESPIRATION BLADDER Height & Weight     Self  Normal Continent Weight:   Height:     BEHAVIORAL SYMPTOMS/MOOD NEUROLOGICAL BOWEL NUTRITION STATUS      Continent Diet (heart healty)  AMBULATORY STATUS COMMUNICATION OF NEEDS Skin   Limited Assist Verbally Normal                       Personal Care Assistance Level of Assistance  Bathing, Feeding, Dressing Bathing Assistance: Limited assistance Feeding  assistance: Limited assistance Dressing Assistance: Limited assistance     Functional Limitations Info  Sight, Hearing, Speech Sight Info: Adequate Hearing Info: Adequate Speech Info: Adequate    SPECIAL CARE FACTORS FREQUENCY                       Contractures Contractures Info: Not present    Additional Factors Info  Allergies   Allergies Info: Nsaids REACTION: Has chronic renal disease / Benazepril Hcl REACTION: Dizziness / Furosemide REACTION: Hypernatremia / AtenololREACTION: Headaches           Current Medications (08/30/2021):   Current Outpatient Medications  Medication Sig Dispense Refill   diclofenac Sodium (VOLTAREN) 1 % GEL Apply to right knee 2-3 times daily as needed for pain 50 g 0   mupirocin cream (BACTROBAN) 2 % Apply 1 application topically 2 (two) times daily. 15 g 0   No current facility-administered medications for this visit.     Relevant Imaging Results:  Relevant Lab Results:   Additional Information SS #  [id number]  [name], LCSW

## 2021-08-30 NOTE — Chronic Care Management (AMB) (Signed)
Care Management Clinical Social Work Note  08/30/2021 Name: Megan Stevenson MRN: [id number] DOB: Jan 23, 1941  Megan Stevenson is a 80 y.o. year old female who is a primary care patient of [name], MD.  The Care Management team was consulted for assistance with coordination needs.  Level of Care Concerns and facility placement.  Consent to Services:  The patient was given information about Care Management services, agreed to services, and gave verbal consent prior to initiation of services.  Please see initial visit note for detailed documentation.   Patient agreed to services today and consent obtained.   Assessment: Engaged with patient's sister Megan Stevenson by phone in response to provider referral for social work care coordination services: .   Patient continues to experience difficulty with meeting ADL's  Megan Stevenson from [facility] PT is recommending facility placement for patient.  Sister is in agreement.Marland [place] FL2 has been faxed to [place] below for interventions and patient self-care actives.  Recent life changes or stressors: Patient's health continues to decline.  Follow up Plan: CCM LCSW will continue to collaborate with PCP in order to meet patient's needs .   Review of patient past medical history, allergies, medications, and health status, including review of relevant consultants reports was performed today as part of a comprehensive evaluation and provision of chronic care management and care coordination services.  SDOH (Social Determinants of Health) assessments and interventions performed:    Advanced Directives Status: See [employer]yn[employer] application for related entries.  Care Plan  Allergies  Allergen Reactions   Nsaids Other (See Comments)    REACTION: Has chronic renal disease   Benazepril Hcl Other (See Comments)    REACTION: Dizziness   Furosemide Other (See Comments)    REACTION: Hypernatremia   Atenolol Other (See Comments)    REACTION: Headaches    Outpatient  Encounter Medications as of 08/30/2021  Medication Sig   diclofenac Sodium (VOLTAREN) 1 % GEL Apply to right knee 2-3 times daily as needed for pain   mupirocin cream (BACTROBAN) 2 % Apply 1 application topically 2 (two) times daily.   No facility-administered encounter medications on file as of 08/30/2021.    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   Macular hole, 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   OSTEOARTHRITIS, 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   Hyperlipidemia associated with type 2 diabetes mellitus ([place]) 07/24/2007   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   Hypertension associated with diabetes ([place]) 07/24/2007    Conditions to be addressed/monitored: Dementia; Level of care concerns  Care Plan : General Social Work (Adult)  Updates made by [name], LCSW since 08/30/2021 12:00 AM     Problem: No Advance Directive      Goal: Effective Long-Term Care Planning Completed 08/30/2021  Start Date: 06/07/2021  Recent Progress: On track  Priority: Medium  Note:   Current barriers:   Patient does not have an advance directive.  Needs education, support and coordination in order to meet this need. Clinical Goal(s): Over the  next 30 days, the patient and family will review information on advance directive, complete advance directive packet and have notarized.  Interventions: Collaboration with PCP regarding development and update of comprehensive plan of care as evidenced by provider  attestation and co-signature. Inter-disciplinary care team collaboration (see longitudinal plan of care) A voluntary discussion about advanced care planning including importance of advanced directives, healthcare proxy and living will was discussed with the patient's sister .  Patient Goals/Self-Care Activities : Over the next 30 days Complete Advance Directive packet,  Have advance directive notarized and provide a copy to provider office Call LCSW if you have questions     Problem: Mobility and Independence      Goal: Facility placement   Start Date: 08/30/2021  This Visit's Progress: On track  Priority: High  Note:   Current barriers:   Patient's health has declined and longer able to meet ADL's at home.  Acknowledges deficits with placement process and needs support in order to meet this need Currently unable to  independently self manage needs related to chronic health conditions.  Clinical Goal(s): Over the next 30 to 45 days patient will be placed in a facility, patient and family will work with LCSW, to coordinate needs for long term skilled nursing facility  Placement Clinical Interventions: Inter-disciplinary care team collaboration (see longitudinal plan of care) Assessed needs and reviewed facility placement process; including applying for Medicaid Provided a list of facilities and educational information "when a loved one needs Long-term Care" Obtained patient's sister Megan Stevenson verbal permission to fax FL2 and supporting document to facilities as well as obtain PASSAR  Initiated process to obtain [employer] with primary care provider for completion of FL2 Collaborate with identified facilities and assist patient as needed to understand facility selection process Assist patient with faxing FL2 to [name] at [phone] once completed and signed by PCP Solution-Focused Strategies Active listening / Reflection utilized  Emotional Supportive  Provided Caregiver stress acknowledged  ; Patient Goals/Self-Care Activities: Over the next 10 days Select a facilities from the list provided  Call to check on availability and visit facilities Sister requested to have [place] faxed to [place], [place] / [place]   [phone]

## 2021-08-30 NOTE — Telephone Encounter (Signed)
Megan Stevenson, PT with [facility] is requesting that provider complete FL2 paperwork so patient can start the process for being placed in nursing facility.   Once FL2 is completed, form will need to be faxed to [name] at [phone].  Forwarding to PCP and Neoma Laming. Patient has Medicare, unsure if Medicare has a different FL2 form.   Please advise.   Talbot Grumbling, RN

## 2021-08-30 NOTE — Patient Instructions (Signed)
Visit Information   Goals Addressed             This Visit's Progress    COMPLETED: Effective Long-Term Care Planning       Patient Goals/Self-Care Activities :  Complete Advance Directive packet,  Have advance directive notarized and provide a copy to provider office Call LCSW if you have questions        Faciltiy placement       Patient Goals/Self-Care Activities: Over the next 10 days Select a facilities from the list provided  Call to check on availability and visit facilities Sister requested to have [place] faxed to [name]        It was a pleasure speaking with you today. Please call the office if needed Patient's Sister verbalizes understanding of instructions provided today.  Per our conversation I will remain part of your care team for the next 90 days.  If no needs are identified in the next 90 days, I will disconnect from the care team.  [name], [facility] Coordination  [phone]

## 2021-09-04 DIAGNOSIS — F0390 Unspecified dementia without behavioral disturbance: Secondary | ICD-10-CM | POA: Diagnosis not present

## 2021-09-04 DIAGNOSIS — E1122 Type 2 diabetes mellitus with diabetic chronic kidney disease: Secondary | ICD-10-CM | POA: Diagnosis not present

## 2021-09-04 DIAGNOSIS — E1159 Type 2 diabetes mellitus with other circulatory complications: Secondary | ICD-10-CM | POA: Diagnosis not present

## 2021-09-04 DIAGNOSIS — M170 Bilateral primary osteoarthritis of knee: Secondary | ICD-10-CM | POA: Diagnosis not present

## 2021-09-04 DIAGNOSIS — I152 Hypertension secondary to endocrine disorders: Secondary | ICD-10-CM | POA: Diagnosis not present

## 2021-09-04 DIAGNOSIS — N184 Chronic kidney disease, stage 4 (severe): Secondary | ICD-10-CM | POA: Diagnosis not present

## 2021-09-05 ENCOUNTER — Telehealth: Payer: Self-pay | Admitting: Family Medicine

## 2021-09-05 NOTE — Telephone Encounter (Signed)
Candi Leash with [facility] came in the office today checking on the status of patient's paperwork that was faxed over. I told her I would send a message back asking if one of the doctors had the paperwork as I was not able to find it in either doctors box. Please advise. Thanks!   Carolyn's contact information: Office: [phone] Fax: [phone] Mobile: [phone] Email: [email or web address]

## 2021-09-06 NOTE — Telephone Encounter (Signed)
Routed message to [facility]. Salvatore Marvel, CMA

## 2021-09-07 ENCOUNTER — Other Ambulatory Visit: Payer: Self-pay | Admitting: Family Medicine

## 2021-09-07 DIAGNOSIS — R4189 Other symptoms and signs involving cognitive functions and awareness: Secondary | ICD-10-CM

## 2021-09-07 NOTE — Chronic Care Management (AMB) (Signed)
  Care Management   Note  09/07/2021 Name: ROSELIN WIEMANN MRN: [id number] DOB: [birth year]  [name] is a 80 y.o. year old female who is a primary care patient of [name], MD and is actively engaged with the care management team. I reached out to Lorenso Courier by phone today to assist with scheduling a follow up visit with the Licensed Clinical Social Worker  Follow up plan: Telephone appointment with care management team member scheduled for:09/18/21  [place] Management  Direct Dial: [phone]

## 2021-09-10 DIAGNOSIS — N184 Chronic kidney disease, stage 4 (severe): Secondary | ICD-10-CM | POA: Diagnosis not present

## 2021-09-10 DIAGNOSIS — M170 Bilateral primary osteoarthritis of knee: Secondary | ICD-10-CM | POA: Diagnosis not present

## 2021-09-10 DIAGNOSIS — F0390 Unspecified dementia without behavioral disturbance: Secondary | ICD-10-CM | POA: Diagnosis not present

## 2021-09-10 DIAGNOSIS — E1122 Type 2 diabetes mellitus with diabetic chronic kidney disease: Secondary | ICD-10-CM | POA: Diagnosis not present

## 2021-09-10 DIAGNOSIS — E1159 Type 2 diabetes mellitus with other circulatory complications: Secondary | ICD-10-CM | POA: Diagnosis not present

## 2021-09-10 DIAGNOSIS — I152 Hypertension secondary to endocrine disorders: Secondary | ICD-10-CM | POA: Diagnosis not present

## 2021-09-10 NOTE — Telephone Encounter (Signed)
FL2 paperwork has been filled out and faxed. See phone note 08/30/2021. Pt has an appt with  CCM on 09/18/2021. Ottis Stain, CMA

## 2021-09-11 ENCOUNTER — Ambulatory Visit: Payer: Self-pay | Admitting: Licensed Clinical Social Worker

## 2021-09-11 NOTE — Patient Instructions (Signed)
   Patient was not contacted during this encounter.  LCSW collaborated with care team to accomplish patient's care plan goal   Deanglo Hissong, LCSW Care Management & Coordination  [phone]  

## 2021-09-11 NOTE — Chronic Care Management (AMB) (Signed)
  Care Management  Collaboration  Note  09/11/2021 Name: Megan Stevenson MRN: [id number] DOB: August 13, 1941  Megan Stevenson is a 80 y.o. year old female who is a primary care patient of [name], MD. The CCM team was consulted reference care coordination needs for Level of Care Concerns for placement.  Assessment: Patient was not interviewed or contacted during this encounter. Follow up with [facility] for possible placement and bed availability. See Care Plan or interventions for patient self-care actives.   Intervention:Conducted brief assessment, recommendations and relevant information discussed. LCSW re-faxed FL2 today. CCM LCSW collaborated with [name] for availability for placement .    Follow up Plan:  CCM LCSW will continue to collaborate with patient's sister and [facility] in order to meet patient's needs . [place] will f/u with LCSW in 24 to 48 hours;    Review of patient past medical history, allergies, medications, and health status, including review of pertinent consultant reports was performed as part of comprehensive evaluation and provision of care management/care coordination services.   Care Plan Conditions to be addressed/monitored per PCP order: Dementia, Level of care concerns   Care Plan : General Social Work (Adult)  Updates made by Megan Cane, LCSW since 09/11/2021 12:00 AM     Problem: Mobility and Independence      Goal: Facility placement   Start Date: 08/30/2021  Recent Progress: On track  Priority: High  Note:   Current barriers:   [place] asked LCSW to refax FL2 Patient's health has declined and longer able to meet ADL's at home.  Acknowledges deficits with placement process and needs support in order to meet this need Currently unable to  independently self manage needs related to chronic health conditions.  Clinical Goal(s): Over the next 30 to 45 days patient will be placed in a facility, patient and  family will work with LCSW, to coordinate needs for long term skilled nursing facility  Placement Clinical Interventions: Inter-disciplinary care team collaboration (see longitudinal plan of care) Assessed needs and reviewed facility placement process; including applying for Medicaid Provided a list of facilities and educational information "when a loved one needs Long-term Care" Obtained patient's sister Megan Stevenson verbal permission to fax FL2 and supporting document to facilities as well as obtain PASSAR  Initiated process to obtain PASSAR # (  [id number]) Collaborated with primary care provider for completion of FL2 Collaborate with [facility] at [facility] [phone] to discuss needs for placement and availability  Requested LCSW to re-faxed FL2 to [name] at [phone]  Solution-Focused Strategies Active listening / Reflection utilized  Emotional Supportive Provided Caregiver stress acknowledged  ; Patient Goals/Self-Care Activities: Over the next 10 days Select a facilities from the list provided  Call to check on availability and visit facilities Sister requested to have [place] faxed to [place], [place] / [place]   [phone]

## 2021-09-13 ENCOUNTER — Ambulatory Visit: Payer: Self-pay | Admitting: Licensed Clinical Social Worker

## 2021-09-13 DIAGNOSIS — Z8673 Personal history of transient ischemic attack (TIA), and cerebral infarction without residual deficits: Secondary | ICD-10-CM | POA: Diagnosis not present

## 2021-09-13 DIAGNOSIS — Z8711 Personal history of peptic ulcer disease: Secondary | ICD-10-CM | POA: Diagnosis not present

## 2021-09-13 DIAGNOSIS — E1122 Type 2 diabetes mellitus with diabetic chronic kidney disease: Secondary | ICD-10-CM | POA: Diagnosis not present

## 2021-09-13 DIAGNOSIS — E1169 Type 2 diabetes mellitus with other specified complication: Secondary | ICD-10-CM | POA: Diagnosis not present

## 2021-09-13 DIAGNOSIS — E1159 Type 2 diabetes mellitus with other circulatory complications: Secondary | ICD-10-CM | POA: Diagnosis not present

## 2021-09-13 DIAGNOSIS — E785 Hyperlipidemia, unspecified: Secondary | ICD-10-CM | POA: Diagnosis not present

## 2021-09-13 DIAGNOSIS — M170 Bilateral primary osteoarthritis of knee: Secondary | ICD-10-CM | POA: Diagnosis not present

## 2021-09-13 DIAGNOSIS — D631 Anemia in chronic kidney disease: Secondary | ICD-10-CM | POA: Diagnosis not present

## 2021-09-13 DIAGNOSIS — F0390 Unspecified dementia without behavioral disturbance: Secondary | ICD-10-CM | POA: Diagnosis not present

## 2021-09-13 DIAGNOSIS — N184 Chronic kidney disease, stage 4 (severe): Secondary | ICD-10-CM | POA: Diagnosis not present

## 2021-09-13 DIAGNOSIS — Z7189 Other specified counseling: Secondary | ICD-10-CM

## 2021-09-13 DIAGNOSIS — Z86010 Personal history of colonic polyps: Secondary | ICD-10-CM | POA: Diagnosis not present

## 2021-09-13 DIAGNOSIS — M109 Gout, unspecified: Secondary | ICD-10-CM | POA: Diagnosis not present

## 2021-09-13 DIAGNOSIS — I152 Hypertension secondary to endocrine disorders: Secondary | ICD-10-CM | POA: Diagnosis not present

## 2021-09-13 DIAGNOSIS — Z87891 Personal history of nicotine dependence: Secondary | ICD-10-CM | POA: Diagnosis not present

## 2021-09-13 DIAGNOSIS — M6284 Sarcopenia: Secondary | ICD-10-CM | POA: Diagnosis not present

## 2021-09-13 DIAGNOSIS — K5730 Diverticulosis of large intestine without perforation or abscess without bleeding: Secondary | ICD-10-CM | POA: Diagnosis not present

## 2021-09-13 NOTE — Patient Instructions (Signed)
   Patient was not contacted during this encounter.  LCSW collaborated with care team to accomplish patient's care plan goal   Kullen Tomasetti, LCSW Care Management & Coordination  [phone]  

## 2021-09-13 NOTE — Chronic Care Management (AMB) (Signed)
  Care Management  Collaboration  Note  09/13/2021 Name: Megan Stevenson MRN: [id number] DOB: 1941/03/04  Megan Stevenson is a 80 y.o. year old female who is a primary care patient of [name], MD. The CCM team was consulted reference care coordination needs for Level of Care Concerns.  Assessment: Patient was not interviewed or contacted during this encounter. Spoke with Magda Paganini at [name], she has received the [facility] but has not made a decision on the bed offer, will f/u with LCSW in a few days. See Care Plan or interventions for patient self-care actives.   Intervention:Conducted brief assessment, recommendations and relevant information discussed. CCM LCSW collaborated with Magda Paganini at [name].  Other collaborations with Hoyle Sauer with [facility] for needed information from [facility] office and CMA at [facility] office .    Follow up Plan:  CCM LCSW will continue to collaborate with [place] for placement options in order to meet patient's needs . Phone appointment schedules with patient's sister 09/18/2021   Review of patient past medical history, allergies, medications, and health status, including review of pertinent consultant reports was performed as part of comprehensive evaluation and provision of care management/care coordination services.   Care Plan Conditions to be addressed/monitored per PCP order: Dementia, Level of care concerns   Care Plan : General Social Work (Adult)  Updates made by [name], LCSW since 09/13/2021 12:00 AM     Problem: Mobility and Independence      Goal: Facility placement   Start Date: 08/30/2021  Recent Progress: On track  Priority: High  Note:   Current barriers:   [place] asked LCSW to refax FL2 Patient's health has declined and longer able to meet ADL's at home.  Acknowledges deficits with placement process and needs support in order to meet this need Currently unable to  independently self manage needs  related to chronic health conditions.  Clinical Goal(s): Over the next 30 to 45 days patient will be placed in a facility, patient and family will work with LCSW, to coordinate needs for long term skilled nursing facility  Placement Clinical Interventions: Inter-disciplinary care team collaboration (see longitudinal plan of care) Assessed needs and reviewed facility placement process; including applying for Medicaid Provided a list of facilities and educational information "when a loved one needs Long-term Care" Obtained patient's sister Megan Stevenson verbal permission to fax FL2 and supporting document to facilities as well as obtain PASSAR  Initiated process to obtain PASSAR # (  [id number]) Collaborated with primary care provider for completion of FL2 Collaborate with [place] at [facility] [phone] to discuss needs for placement and availability  Requested LCSW to re-faxed FL2 to [place] at [phone]  Patient Goals/Self-Care Activities: Over the next 10 days Select a facilities from the list provided  Call to check on availability and visit facilities Sister requested to have [place] faxed to [place], [place] / [place]   [phone]

## 2021-09-18 ENCOUNTER — Ambulatory Visit: Payer: Medicare Other | Admitting: Licensed Clinical Social Worker

## 2021-09-18 DIAGNOSIS — I152 Hypertension secondary to endocrine disorders: Secondary | ICD-10-CM | POA: Diagnosis not present

## 2021-09-18 DIAGNOSIS — Z7189 Other specified counseling: Secondary | ICD-10-CM

## 2021-09-18 DIAGNOSIS — N184 Chronic kidney disease, stage 4 (severe): Secondary | ICD-10-CM | POA: Diagnosis not present

## 2021-09-18 DIAGNOSIS — F0390 Unspecified dementia without behavioral disturbance: Secondary | ICD-10-CM | POA: Diagnosis not present

## 2021-09-18 DIAGNOSIS — E1159 Type 2 diabetes mellitus with other circulatory complications: Secondary | ICD-10-CM | POA: Diagnosis not present

## 2021-09-18 DIAGNOSIS — M170 Bilateral primary osteoarthritis of knee: Secondary | ICD-10-CM | POA: Diagnosis not present

## 2021-09-18 DIAGNOSIS — E1122 Type 2 diabetes mellitus with diabetic chronic kidney disease: Secondary | ICD-10-CM | POA: Diagnosis not present

## 2021-09-18 DIAGNOSIS — Z741 Need for assistance with personal care: Secondary | ICD-10-CM

## 2021-09-18 NOTE — Patient Instructions (Signed)
Visit Information   Goals Addressed             This Visit's Progress    Faciltiy placement   On track    Patient Goals/Self-Care Activities: Over the next 10 days Per sister's request FL2 refaxed to [name]         It was a pleasure speaking with you today. Please call the office if needed Patient's sister Carloyn Manner understanding of instructions provided today.  Per your request no f/u is schedule you stated you wanted to call me  [name], [place] [facility]  [phone]

## 2021-09-18 NOTE — Chronic Care Management (AMB) (Signed)
Care Management  Clinical Social Work Note  09/18/2021 Name: LYNLEIGH KOVACK MRN: [id number] DOB: August 21, 1941  Liliane Shi Norment is a 80 y.o. year old female who is a primary care patient of [name], MD. The CCM team was consulted for assistance with care coordination needs. Level of Care Concerns   Consent to Services:  The patient was given information about Care Management services, agreed to services, and gave verbal consent prior to initiation of services.  Please see initial visit note for detailed documentation.   Patient agreed to services today and consent obtained.  Engaged with patient's sister by phone in response to provider referral for social work care coordination services:   Assessment/Interventions: Assessed patient's current treatment, progress, coping skills, support system and barriers to care.  Patient's sister provided all information during this encounter. patient continues to experience difficulty with caring for self but will not allow PCS services in the home. Sister assist with meeting needs and patient's son lives with her.   .  See Care Plan below for interventions and patient self-care actives.  Recent life changes or stressors: 84 HH will meet with patient's sister to discuss options today  Recommendation: Patient may benefit from, and  PT [facility] specialist ss in agreement to talk with supervisor to order a social worker in the home to assess the situation. Discussed safety with patient's sister and HH PT.  They are schedule to meet at patient's home today and will involve Adult protective services if needed.   Follow up Plan:  No follow up scheduled with CCM LCSW at this time. Offered to schedule sister states she will follow up with LCSW CCM LCSW will continue to collaborate with [name] and [place]  in order to meet patient's needs .    Review of patient past medical history, allergies, medications, and health status, including  review of pertinent consultant reports was performed as part of comprehensive evaluation and provision of care management/care coordination services.   SDOH (Social Determinants of Health) screening and interventions performed today:   Advanced Directives Status:Not addressed in this encounter.     Care Plan    Conditions to be addressed/monitored per PCP order: , Level of care concerns  Patient Care Plan: General Social Work (Adult)     Problem Identified: No Advance Directive      Goal: Effective Long-Term Care Planning Completed 08/30/2021  Start Date: 06/07/2021  Recent Progress: On track  Priority: Medium  Note:   Current barriers:   Patient does not have an advance directive.  Needs education, support and coordination in order to meet this need. Clinical Goal(s): Over the next 30 days, the patient and family will review information on advance directive, complete advance directive packet and have notarized.  Interventions: Collaboration with PCP regarding development and update of comprehensive plan of care as evidenced by provider attestation and co-signature. Inter-disciplinary care team collaboration (see longitudinal plan of care) A voluntary discussion about advanced care planning including importance of advanced directives, healthcare proxy and living will was discussed with the patient's sister .  Patient Goals/Self-Care Activities : Over the next 30 days Complete Advance Directive packet,  Have advance directive notarized and provide a copy to provider office Call LCSW if you have questions     Problem Identified: Mobility and Independence      Goal: Facility placement   Start Date: 08/30/2021  This Visit's Progress: On track  Recent Progress: On track  Priority: High  Note:   Current barriers:   Radiation protection practitioner continues to review FL2 for bed offer [name] asked LCSW to refax FL2 Patient's health has declined and longer able to meet ADL's at home.   Acknowledges deficits with placement process and needs support in order to meet this need Currently unable to  independently self manage needs related to chronic health conditions.  Clinical Goal(s): Over the next 30 to 45 days patient will be placed in a facility, patient and family will work with LCSW, to coordinate needs for long term skilled nursing facility  Placement Clinical Interventions: Inter-disciplinary care team collaboration (see longitudinal plan of care) Assessed needs and reviewed facility placement process; including applying for Medicaid Provided a list of facilities and educational information "when a loved one needs Long-term Care" Obtained patient's sister [name] verbal permission to fax FL2 and supporting document to facilities as well as obtain PASSAR  Initiated process to obtain PASSAR # (  [id number]) Collaborated with [name]. PT at [facility] for placement recommendation  [phone] Collaborate with Timoteo Ace at [facility] [phone] to discuss needs for placement and availability  Call made to [place] daily ( continues to review has not made a decision) Patient Goals/Self-Care Activities: Over the next 10 days Per sister's request, I have faxed FL2 to [place], [place] / [place]   [phone]

## 2021-09-27 DIAGNOSIS — N184 Chronic kidney disease, stage 4 (severe): Secondary | ICD-10-CM | POA: Diagnosis not present

## 2021-09-27 DIAGNOSIS — E1159 Type 2 diabetes mellitus with other circulatory complications: Secondary | ICD-10-CM | POA: Diagnosis not present

## 2021-09-27 DIAGNOSIS — E1122 Type 2 diabetes mellitus with diabetic chronic kidney disease: Secondary | ICD-10-CM | POA: Diagnosis not present

## 2021-09-27 DIAGNOSIS — F0390 Unspecified dementia without behavioral disturbance: Secondary | ICD-10-CM | POA: Diagnosis not present

## 2021-09-27 DIAGNOSIS — I152 Hypertension secondary to endocrine disorders: Secondary | ICD-10-CM | POA: Diagnosis not present

## 2021-09-27 DIAGNOSIS — M170 Bilateral primary osteoarthritis of knee: Secondary | ICD-10-CM | POA: Diagnosis not present

## 2021-10-29 ENCOUNTER — Ambulatory Visit: Payer: Medicare Other | Admitting: Licensed Clinical Social Worker

## 2021-10-29 DIAGNOSIS — Z7189 Other specified counseling: Secondary | ICD-10-CM

## 2021-10-29 NOTE — Patient Instructions (Signed)
Licensed Clinical Social Worker Visit Information  Goals we discussed today:   Goals Addressed             This Visit's Progress    additional support to manage care   On track    Patient Goals/Self-Care Activities:   I have placed a referral with the [facility] program, they will call to schedule a time to review the program I left a brochure with nurse assistance to give to you at your appointment 10/30/21        It was a pleasure speaking with you today. No follow up scheduled, per our conversation I will contact you in 2 to 3 weeks.  Please call the office if needed prior to next encounter. Patient's caregiver verbalizes understanding of instructions provided today and agrees to view in [place]. Patient's caregiver verbalized understanding of instructions, educational materials, and care plan provided today and declined offer to receive copy of patient instructions, educational materials, and care plan.  [name], LCSW Care Management & Coordination  [phone]

## 2021-10-29 NOTE — Chronic Care Management (AMB) (Signed)
Care Management  Clinical Social Work Note  10/29/2021 Name: Megan Stevenson MRN: [id number] DOB: October 24, 1941  Megan Stevenson is a 80 y.o. year old female who is a primary care patient of Megan Dad, MD. The CCM team was consulted for assistance with care coordination needs. Level of Care Concerns   Consent to Services:  The patient was given information about Care Management services, agreed to services, and gave verbal consent prior to initiation of services.  Please see initial visit note for detailed documentation.   Patient 's sister agreed to services today and consent obtained for follow up visit in response to provider referral for social work care coordination services.   Assessment/Interventions: Assessed patient's current treatment, progress, coping skills, support system and barriers to care.  Patient's Sister Megan Stevenson provided all information during this encounter. Collaborated with [facility] program and [facility] PT Megan Stevenson [phone] during this encounter in order to meet patient's ongoing needs. Patient is no longer receiving HH PT. Patient continues to experience difficulty with meeting needs, sister is also elderly and has limiter support to meet patient's needs and her own needs with navigating care. Sister gets confused with all the steps that needs to be completed for placement.  See Care Plan below for interventions and patient self-care actives.  Recommendation: Patient may benefit from, and sister is in agreement for LCSW to make referral to [facility] program to assist with navigating care and to have all of patient's care needs met in one location.   Follow up Plan:  No follow up scheduled with CCM LCSW at this time. Will follow up with patient' sister in 2 weeks . They will call office if needed prior to next encounter. CCM LCSW will continue to collaborate with [facility] program in order to meet patient's needs .   Review of patient past medical history, allergies, medications, and  health status, including review of pertinent consultant reports was performed as part of comprehensive evaluation and provision of care management/care coordination services.   SDOH (Social Determinants of Health) screening and interventions performed today:   Advanced Directives Status:Not addressed in this encounter.     Care Plan    Conditions to be addressed/monitored per PCP order: Dementia, Level of care concerns  Care Plan : General Social Work (Adult)  Updates made by Megan Cane, LCSW since 10/29/2021 12:00 AM     Problem: Mobility and Independence      Goal: Care giver support for level of care concerns   Start Date: 08/30/2021  This Visit's Progress: Not on track  Recent Progress: On track  Priority: High  Note:   Current barriers:   Did not receive bed officer from [facility] has declined and longer able to meet ADL's at home.  Acknowledges deficits with placement process and needs support in order to meet this need Currently unable to  independently self manage needs related to chronic health conditions.  Clinical Goal(s): Over the next 30 days patient will receive additional support to need ADL's and level of care needs, patient and family will work with LCSW, to coordinate needs. Clinical Interventions: Inter-disciplinary care team collaboration (see longitudinal plan of care) Collaborated with Megan Stevenson. PT at [facility] for placement recommendation  [phone] She is no longer providing  [facility] PT for patient Discussed options and barriers with patient's sister Collaborated with Megan Stevenson intake worker at [facility] placed referral Patient Goals/Self-Care Activities:  I have placed a referral with the [facility] program, they will call to  schedule a time to review the program I left a brochure with nurse assistance to give to you at your appointment 10/30/21     Megan Stevenson, Megan Stevenson / [place]   [phone]

## 2021-10-30 ENCOUNTER — Ambulatory Visit (INDEPENDENT_AMBULATORY_CARE_PROVIDER_SITE_OTHER): Payer: Medicare Other | Admitting: Family Medicine

## 2021-10-30 ENCOUNTER — Other Ambulatory Visit: Payer: Self-pay

## 2021-10-30 ENCOUNTER — Ambulatory Visit (INDEPENDENT_AMBULATORY_CARE_PROVIDER_SITE_OTHER): Payer: Medicare Other

## 2021-10-30 VITALS — BP 135/55 | HR 60 | Wt 142.0 lb

## 2021-10-30 DIAGNOSIS — Z23 Encounter for immunization: Secondary | ICD-10-CM

## 2021-10-30 DIAGNOSIS — I10 Essential (primary) hypertension: Secondary | ICD-10-CM

## 2021-10-30 DIAGNOSIS — F0390 Unspecified dementia without behavioral disturbance: Secondary | ICD-10-CM | POA: Diagnosis not present

## 2021-10-30 MED ORDER — CLONIDINE HCL 0.2 MG PO TABS: 0.2000 mg | ORAL_TABLET | Freq: Every day | ORAL | 0 refills | Status: DC

## 2021-10-30 NOTE — Patient Instructions (Addendum)
It was great to see you!  [facility] is an excellent program that can help coordinate all your needs. They can provide assistance with some of Megan Stevenson's caretaking.  I would highly recommend reaching out to them to discuss enrollment in their program. They will likely call you in a few weeks as well.  Take care, Dr. Rock Nephew

## 2021-10-30 NOTE — Progress Notes (Signed)
    SUBJECTIVE:   CHIEF COMPLAINT / HPI:   Dementia, Level of Care Concerns Patient here with her sister to discuss dementia and level of care concerns. Was evaluated in geri clinic on 06/14/2021 at which time her MoCA was 5/30. 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 Remains on Donepezil. She is independent with ADLs but needs assistance with all iADLs. Currently lives with her husband (who is wheelchair bound) and their son. Sister lives nearby and provides a large portion of her care.  CCM has been involved due to difficulty providing sufficient care for [place] at home. They were interested in SNF placement at [facility]. FL-2 was completed but no beds available and sister had some challenges following up. CCM now recommending [facility] program. Patient and her sister are hesitant about [facility]ACE for reasons that are not entirely clear. Seems they want [place] to stay in the home and have in-home services ideally.  PERTINENT  PMH / PSH: dementia, depression, HTN, CKD, HLD, OA  OBJECTIVE:   BP (!) 135/55   Pulse 60   Wt 142 lb (64.4 kg)   SpO2 100%   BMI 20.97 kg/m   General: NAD, pleasant Respiratory: No respiratory distress Skin: warm and dry, no rashes noted Neuro: grossly intact. Unstable gait (wall-surfs through clinic) due to severe valgus deformity of b/l knees   ASSESSMENT/PLAN:   Dementia ([place]) Established problem. MoCA 5/30 on 06/14/2021. Remains on Donepezil.  -Given brochure for [facility] program today. Encouraged sister to reach out -Sister will also reach out to [name] to see if bed offer becomes available -Appreciate assistance from CCM  Hypertension associated with diabetes ([place]) BP at goal today. Current meds: Clonidine 0.2mg  tablet daily and amlodipine 20mg  daily. Requesting refills on clonidine today. -Clonidine refills provided -Ideally would taper off clonidine given her age/dementia, however do not feel she has the  resources/support to properly do so at this time.    [name], MD [place]

## 2021-10-31 NOTE — Assessment & Plan Note (Signed)
BP at goal today. Current meds: Clonidine 0.2mg  tablet daily and amlodipine 20mg  daily. Requesting refills on clonidine today. -Clonidine refills provided -Ideally would taper off clonidine given her age/dementia, however do not feel she has the resources/support to properly do so at this time.

## 2021-10-31 NOTE — Assessment & Plan Note (Addendum)
Established problem. MoCA 5/30 on 06/14/2021. Remains on Donepezil.  -Given brochure for [facility] program today. Encouraged sister to reach out -Sister will also reach out to [name] to see if bed offer becomes available -Appreciate assistance from [facility]

## 2021-11-19 ENCOUNTER — Ambulatory Visit: Payer: Medicare Other | Admitting: Licensed Clinical Social Worker

## 2021-11-19 DIAGNOSIS — Z7189 Other specified counseling: Secondary | ICD-10-CM

## 2021-11-19 DIAGNOSIS — F0390 Unspecified dementia without behavioral disturbance: Secondary | ICD-10-CM

## 2021-11-19 NOTE — Chronic Care Management (AMB) (Signed)
Care Management  Clinical Social Work Note  11/19/2021 Name: Megan Stevenson MRN: [id number] DOB: 1941-06-01  Megan Stevenson is a 80 y.o. year old female who is a primary care patient of [name], MD. The CCM team was consulted for assistance with care coordination needs. Level of Care Concerns   Consent to Services:  The patient was given information about Care Management services, agreed to services, and gave verbal consent prior to initiation of services.  Please see initial visit note for detailed documentation.   Patient agreed to services today and consent obtained.  Engaged with patient's sister  for follow up visit in response to provider referral for social work care coordination services. Assessed patient's current treatment, progress, coping skills, support system and barriers to care.  Patient's sister Megan Stevenson provided all information during this encounter. Collaborated with PCP in order to meet patient's ongoing needs..   Assessment includes: Review of patient past medical history, allergies, medications, and health status, including review of pertinent consultant reports was performed as part of comprehensive evaluation and provision of care management/care coordination services. See Care Plan below for interventions and patient self-care actives.   SDOH (Social Determinants of Health) screening and interventions performed today:   Advanced Directives Status:See Vynca application for related entries.     Care Plan    Conditions to be addressed/monitored per PCP order: Dementia,   Care Plan : General Social Work (Adult)  Updates made by [name], LCSW since 11/19/2021 12:00 AM     Problem: Mobility and Independence      Goal: Care giver support for level of care concerns   Start Date: 08/30/2021  This Visit's Progress: On track  Recent Progress: Not on track  Priority: High  Note:   Current barriers:   Patient's health has declined and longer able to meet  ADL's at home.  Acknowledges deficits with placement process and needs support in order to meet this need Currently unable to  independently self manage needs related to chronic health conditions.  Clinical Goal(s): Over the next 30 days patient will receive additional support to need ADL's and level of care needs, patient and family will work with LCSW, to coordinate needs. Clinical Interventions: Inter-disciplinary care team collaboration (see longitudinal plan of care) Discussed options and barriers with patient's sister Collaborated with [name] intake worker at [facility]/ placed referral Per sister she does not want to move forward with [facility] would like to work on placement again at [facility] provided fax # [phone]  Collaborate with PCP for new FL2  Will fax to [place] once complete Patient Goals/Self-Care Activities:  Will work with Dr. Rock Nephew to complete new FL2 and fax it to [name] once signed    Follow up Plan:  CCM LCSW will continue to collaborate with PCP for FL2 and [facility] in order to meet patient's needs . Will follow up with sister in 1 to 2 weeks   [name], [place] / [place]   [phone]

## 2021-11-19 NOTE — NC FL2 (Signed)
[place] LEVEL OF CARE SCREENING TOOL     IDENTIFICATION  Patient Name: Megan Stevenson Birthdate: Jan 22, 1941 Sex: female Admission Date (Current Location):   [place] and [place] Number:  Kathleen Argue [id number] Q Facility and Address:         Provider Number: [id number]  Attending Physician Name and Address:  Dr. [name]   [address] [place] [address]  Relative Name and Phone Number:  Welford Roche  / Sister  [phone]    Current Level of Care: Home Recommended Level of Care: Assisted Living Facility Prior Approval Number:    Date Approved/Denied:   PASRR Number: [id number]  Discharge Plan:      Current Diagnoses: Patient Active Problem List   Diagnosis Date Noted   Hearing screen with abnormal findings 06/18/2021   At risk for falls 06/18/2021   Sarcopenia 06/18/2021   Impaired functional mobility, balance, gait, and endurance 06/18/2021   Dementia ([place]) 04/17/2021   Osteoarthritis of both knees 04/17/2021   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   CKD (chronic kidney disease) stage 4, GFR 15-29 ml/min (HCC) 06/13/2018   Microcytic anemia 06/13/2018   Heart murmur 06/13/2018   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   Macular hole, 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   OSTEOARTHRITIS, 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   Hyperlipidemia associated with type 2 diabetes mellitus ([place]) 07/24/2007   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   Hypertension associated with diabetes ([place]) 07/24/2007    Orientation RESPIRATION BLADDER Height & Weight     Self  Normal Continent Weight: 142lb  Height:     BEHAVIORAL SYMPTOMS/MOOD NEUROLOGICAL BOWEL NUTRITION STATUS      Continent Diet (heart healthy)  AMBULATORY STATUS COMMUNICATION OF NEEDS Skin   Limited Assist Verbally Normal                       Personal Care Assistance Level of Assistance  Bathing, Feeding, Dressing Bathing Assistance: Limited  assistance Feeding assistance: Limited assistance Dressing Assistance: Limited assistance     Functional Limitations Info  Sight, Hearing, Speech Sight Info: Adequate Hearing Info: Adequate Speech Info: Adequate    SPECIAL CARE FACTORS FREQUENCY                       Contractures Contractures Info: Not present    Additional Factors Info  Allergies   Allergies Info: Nsaids REACTION: Has chronic renal disease / Benazepril Hcl REACTION: Dizziness / Furosemide REACTION: Hypernatremia / AtenololREACTION: Headaches           Current Medications (11/19/2021):   Current Outpatient Medications  Medication Sig Dispense Refill   allopurinol (ZYLOPRIM) 100 MG tablet Take 100 mg by mouth daily.     amLODipine (NORVASC) 10 MG tablet Take 10 mg by mouth daily.     atorvastatin (LIPITOR) 10 MG tablet Take 10 mg by mouth at bedtime.     cloNIDine (CATAPRES) 0.2 MG tablet Take 1 tablet (0.2 mg total) by mouth daily. 90 tablet 0   diclofenac Sodium (VOLTAREN) 1 % GEL Apply to right knee 2-3 times daily as needed for pain 50 g 0   donepezil (ARICEPT) 10 MG tablet TAKE 1 TABLET(10 MG) BY MOUTH DAILY 90 tablet 0   mupirocin cream (BACTROBAN) 2 % Apply 1 application topically 2 (two) times daily. 15 g 0   No current facility-administered medications for this visit.     Additional Information SS #  [id number]  [name], LCSW

## 2021-11-19 NOTE — Patient Instructions (Addendum)
Visit Information  Thank you for taking time to visit with me today. Please don't hesitate to contact me if I can be of assistance to you before our next scheduled telephone appointment.  Following are the goals we discussed today: long-term care goals Self-Care Activities or updates  Will work with Dr. Rock Nephew to complete new FL2 and fax it to [name] once signed   Please call the care guide team at [phone] if you need to cancel or reschedule your appointment.   If you are experiencing a Mental Health or [place] or need someone to talk to, please call the [place] National Suicide Prevention Lifeline: 219-201-3726 or TTY: 310-335-0651 TTY 630-502-3162) to talk to a trained counselor call 1-800-273-TALK (toll free, 24 hour hotline) go to [facility] [place] [phone]) call 911    Please call the office if needed No follow up scheduled, per our conversation I will contact you in 1 to 2 weeks.  Please call the office if needed prior to next encounter.  [name], LCSW Care Management & Coordination  [phone]

## 2021-11-20 ENCOUNTER — Ambulatory Visit: Payer: Medicare Other | Admitting: Licensed Clinical Social Worker

## 2021-11-20 DIAGNOSIS — F0390 Unspecified dementia without behavioral disturbance: Secondary | ICD-10-CM

## 2021-11-20 DIAGNOSIS — Z7189 Other specified counseling: Secondary | ICD-10-CM

## 2021-11-20 NOTE — Patient Instructions (Signed)
Visit Information  Thank you for taking time to visit with me today. Please don't hesitate to contact me if I can be of assistance to you before our next scheduled telephone appointment.  Following are the goals we discussed today: facility placement Patient Self-Care Activities and updates:  [facility]  faxed to [facility] agreed to follow up with [facility] Please call the office if additional assistance is needed    Please call the care guide team at [phone] if you need to cancel or reschedule your appointment.   If you are experiencing a Mental Health or [place] or need someone to talk to, please call the Suicide and Crisis Lifeline: 988 go to [facility] Urgent [place] [phone]) call 911    Please call the office if needed No follow up scheduled, per our conversation I will contact you in 1 week.  Please call the office if needed prior to next encounter. Patient's caregiver verbalized understanding of instructions, educational materials, and care plan provided today and declined offer to receive copy of patient instructions, educational materials, and care plan.  [name], LCSW Care Management & Coordination  [phone]

## 2021-11-20 NOTE — Chronic Care Management (AMB) (Signed)
Care Management  Clinical Social Work Note  11/20/2021 Name: Megan Stevenson MRN: [id number] DOB: June 04, 1941  Megan Stevenson is a 80 y.o. year old female who is a primary care patient of [name], MD. The CCM team was consulted for assistance with care coordination needs. Level of Care Concerns for facility placement.  Consent to Services:  The patient was given information about Care Management services, agreed to services, and gave verbal consent prior to initiation of services.  Please see initial visit note for detailed documentation.   Patient agreed to services today and consent obtained.  Engaged with patient's sister  for follow up visit in response to provider referral for social work care coordination services. Assessed patient's current treatment, progress, coping skills, support system and barriers to care.  Collaborated with PCP and [name] in order to meet patient's ongoing needs..   Assessment includes: Review of patient past medical history, allergies, medications, and health status, including review of pertinent consultant reports was performed as part of comprehensive evaluation and provision of care management/care coordination services. See Care Plan below for interventions and patient self-care actives.   SDOH (Social Determinants of Health) screening and interventions performed today:   Advanced Directives Status:See Vynca application for related entries.     Care Plan    Conditions to be addressed/monitored per PCP order: Dementia, Level of care concerns  Care Plan : General Social Work (Adult)  Updates made by Megan Cane, LCSW since 11/20/2021 12:00 AM     Problem: Mobility and Independence      Goal: Care giver support for level of care concerns   Start Date: 08/30/2021  This Visit's Progress: On track  Recent Progress: On track  Priority: High  Note:   Current barriers:   Patient's health has declined and needs additional support at  home.  Acknowledges deficits with placement process and needs support in order to meet this need Currently unable to  independently self manage needs related to chronic health conditions.  Clinical Goal(s): Over the next 30 days patient will receive additional support to need ADL's and level of care needs, patient and family will work with LCSW, to coordinate needs. Clinical Interventions: Inter-disciplinary care team collaboration (see longitudinal plan of care) Discussed options and barriers with patient's sister for placement at [facility] Sister provided fax # [phone]  Collaborate with PCP for new FL2  Faxed completed FL2 and PCP notes to [place] 11/20/21 Patient Goals/Self-Care Activities:  FL2  faxed to [facility] agreed to follow up with [facility] Please call the office if additional assistance is needed     Follow up Plan:  No follow up scheduled with CCM LCSW at this time. Will follow up with patient sister in 1 week . They will call office if needed prior to next encounter. CCM LCSW will continue to collaborate with Megan Stevenson in order to meet patient's needs .   Megan Stevenson, [place] / [place]   [phone]

## 2021-11-27 ENCOUNTER — Telehealth: Payer: Self-pay | Admitting: Licensed Clinical Social Worker

## 2021-11-27 ENCOUNTER — Ambulatory Visit: Payer: Medicare Other | Admitting: Licensed Clinical Social Worker

## 2021-11-27 DIAGNOSIS — F02B0 Dementia in other diseases classified elsewhere, moderate, without behavioral disturbance, psychotic disturbance, mood disturbance, and anxiety: Secondary | ICD-10-CM

## 2021-11-27 DIAGNOSIS — Z7189 Other specified counseling: Secondary | ICD-10-CM

## 2021-11-27 NOTE — Patient Instructions (Addendum)
Visit Information  Thank you for taking time to visit with me today. Please don't hesitate to contact me if I can be of assistance to you before our next scheduled telephone appointment.  Following are the goals we discussed today: Facility placement  Patient Goals/Self-Care Activities:  [facility]  faxed to [facility] agreed to follow up with [facility] Please call the office if additional assistance is needed   Please call the care guide team at [phone] if you need to cancel or reschedule your appointment.   If you are experiencing a Mental Health or [place] or need someone to talk to, please call 1-800-273-TALK (toll free, 24 hour hotline) go to [facility] [place] [phone]) call 911    Patient's caregiver verbalized understanding of instructions, educational materials, and care plan provided today and declined offer to receive copy of patient instructions, educational materials, and care plan. I will wait for you to call if no call received will reach out to you in 2 weeks  [name], [place]  [phone]

## 2021-11-27 NOTE — Chronic Care Management (AMB) (Signed)
° °   Clinical Social Work  Care Management   Phone Outreach    11/27/2021 Name: Megan Stevenson MRN: [id number] DOB: [birth year]  Megan Stevenson is a 80 y.o. year old female who is a primary care patient of [name], MD .   Reason for referral: Level of Care Concerns.    F/U phone call today to assess needs, progress and barriers with care plan goals.   Unable to leave a HIPPA compliant phone message due to voice mail not set up.  Plan:CCM LCSW will wait for return call. If no return call is received, Will route chart to Care Guide to see if patient would like to reschedule phone appointment   Review of patient status, including review of consultants reports, relevant laboratory and other test results, and collaboration with appropriate care team members and the patient's provider was performed as part of comprehensive patient evaluation and provision of care management services.    [name], [place] / [place]   [phone]

## 2021-11-27 NOTE — Chronic Care Management (AMB) (Signed)
Care Management  Clinical Social Work Note  11/27/2021 Name: Megan Stevenson MRN: [id number] DOB: 1941/10/04  Megan Stevenson is a 80 y.o. year old female who is a primary care patient of Megan Dad, MD. The CCM team was consulted for assistance with care coordination needs. Level of Care Concerns   Consent to Services:  The patient was given information about Care Management services, agreed to services, and gave verbal consent prior to initiation of services.  Please see initial visit note for detailed documentation.   Patient agreed to services today and consent obtained.  Engaged with patient's sister  for follow up visit in response to provider referral for social work care coordination services. Assessed patient's current treatment, progress, coping skills, support system and barriers to care.  Patient's sister Megan Stevenson provided all information during this encounter. She continues to experience difficulty with connecting with [facility] to follow up on bed availabilty..   Assessment includes: Review of patient past medical history, allergies, medications, and health status, including review of pertinent consultant reports was performed as part of comprehensive evaluation and provision of care management/care coordination services. See Care Plan below for interventions and patient self-care actives.   SDOH (Social Determinants of Health) screening and interventions performed today:   Advanced Directives Status:Not addressed in this encounter.     Care Plan    Conditions to be addressed/monitored per PCP order: Dementia, Level of care concerns  Care Plan : General Social Work (Adult)  Updates made by Megan Cane, LCSW since 11/27/2021 12:00 AM     Problem: Mobility and Independence      Goal: Care giver support for level of care concerns   Start Date: 08/30/2021  This Visit's Progress: On track  Recent Progress: On track  Priority: High  Note:   Current barriers:   S Patient's health has declined and needs additional support at home.  Acknowledges deficits with placement process and needs support in order to meet this need Currently unable to  independently self manage needs related to chronic health conditions.  Clinical Goal(s): Over the next 30 days patient will receive additional support to need ADL's and level of care needs, patient and family will work with LCSW, to coordinate needs. Clinical Interventions: Inter-disciplinary care team collaboration (see longitudinal plan of care) Discussed options and barriers with patient's sister for placement at [facility] Sister provided fax # [phone]  Collaborate with PCP for new FL2  Faxed completed FL2 and PCP notes to [place] 11/20/21 Patient Goals/Self-Care Activities:  FL2  faxed to [employer] agreed to follow up with [facility] Please call the office if additional assistance is needed      Follow up Plan:  Sister will follow up with LCSW after she makes contact with [place], [place] / [place]   [phone]

## 2021-11-28 ENCOUNTER — Ambulatory Visit: Payer: Medicare Other | Admitting: Licensed Clinical Social Worker

## 2021-11-28 DIAGNOSIS — Z7189 Other specified counseling: Secondary | ICD-10-CM

## 2021-11-28 DIAGNOSIS — F0390 Unspecified dementia without behavioral disturbance: Secondary | ICD-10-CM

## 2021-11-28 NOTE — Chronic Care Management (AMB) (Signed)
Care Management  Clinical Social Work Note  11/28/2021 Name: Megan Stevenson MRN: [id number] DOB: [birth year]  Megan Stevenson is a 80 y.o. year old female who is a primary care patient of Megan Dad, MD. The CCM team was consulted for assistance with care coordination needs. Level of Care Concerns for placement  Consent to Services:  The patient was given information about Care Management services, agreed to services, and gave verbal consent prior to initiation of services.  Please see initial visit note for detailed documentation.   Patient agreed to services today and consent obtained.  Collaboration with patient's sister  for follow up visit in response to provider referral for social work care coordination services. Assessed patient's current treatment, progress, coping skills, support system and barriers to care.  Patient's sister Megan Stevenson provided all information during this encounter. she is making progress with placement. Confirmed [name] received FL2 and notes but they do not have a bed at this time.  Sister Megan Stevenson will continue to follow up with [name] .   Assessment includes: Review of patient past medical history, allergies, medications, and health status, including review of pertinent consultant reports was performed as part of comprehensive evaluation and provision of care management/care coordination services. See Care Plan below for interventions and patient self-care actives.   SDOH (Social Determinants of Health) screening and interventions performed today:   Advanced Directives Status:Not addressed in this encounter.     Care Plan    Conditions to be addressed/monitored per PCP order: Dementia,   Care Plan : General Social Work (Adult)  Updates made by Megan Cane, LCSW since 11/28/2021 12:00 AM     Problem: Mobility and Independence      Goal: Care giver support for level of care concerns Completed 11/28/2021  Start Date: 08/30/2021   This Visit's Progress: On track  Recent Progress: On track  Priority: High  Note:   Current barriers:  Goal completed,  Patient's health has declined and needs additional support at home.  Acknowledges deficits with placement process and needs support in order to meet this need Currently unable to  independently self manage needs related to chronic health conditions.  Clinical Goal(s): Over the next 30 days patient will receive additional support to need ADL's and level of care needs, patient and family will work with LCSW, to coordinate needs. Clinical Interventions: Inter-disciplinary care team collaboration (see longitudinal plan of care) Discussed options and barriers with patient's sister for placement at [facility] Sister provided fax # [phone]  Collaborate with PCP for new FL2  Faxed completed FL2 and PCP notes to [name] 11/20/21 Patient Goals/Self-Care Activities:  FL2  faxed to [employer] agreed to follow up with [facility] Please call the office if additional assistance is needed      Follow up Plan:  Patien't sister does not require continued follow-up by CCM LCSW.  She will follow up with [name] and contact the office if needed CCM LCSW will disconnect from patient's care team at this time, due to transitioning out of clinic Provider has been informed, If further intervention is needed the care management team is available to follow up after a formal CCM referral is placed.   Megan Stevenson, [place] / [place]   [phone]

## 2021-11-28 NOTE — Patient Instructions (Addendum)
Visit Information  Thank you for taking time to visit with me today. Please don't hesitate to contact me if I can be of assistance to you before our next scheduled telephone appointment.  Following are the goals we discussed today: Facility placement Patient Self-Care Activities and updates:  [facility]  faxed to [employer] agreed to follow up with [facility] Please call the office if additional assistance is needed   Please call the care guide team at [phone] if you need to cancel or reschedule your appointment.   If you are experiencing a Mental Health or [place] or need someone to talk to, please call 1-800-273-TALK (toll free, 24 hour hotline) go to [facility] [address] [place] [phone]) call 911    It was a pleasure speaking with you today. No follow up scheduled, per our conversation you do not require continued follow up I will disconnect from your care team at this time, please call the office if additional needs are identified  Patient's caregiver verbalized understanding of instructions, educational materials, and care plan provided today and declined offer to receive copy of patient instructions, educational materials, and care plan.  [name], LCSW Care Management & Coordination  [phone]

## 2021-12-06 ENCOUNTER — Telehealth: Payer: Self-pay

## 2021-12-06 NOTE — Telephone Encounter (Signed)
Patients sister calls nurse line reporting continued decline in patient. Sister reports they found her facility [facility]) however they are still waiting for a bed. Sister reports concerns for her safety as she fell recently getting out of bed last week. Sister denies any injury. Sister reports she is not eating well. Sister reports she takes her food, however she is only taking small bites. Sister reports it has been difficult trying to take care of her with her own family and her own elderly age. Sister reports patients son lives with her, however is on drugs and does not "trust" him to take "good" care of her.   [name] advised to call 911 if she feels the patient is dehydrated and in need of medical care. [name] reports she is going over there today to check on her.   Will forward to PCP to review.

## 2021-12-08 NOTE — Telephone Encounter (Signed)
°  Agree that patient should be seen in the ED if there is concern for dehydration, if there are any injuries related to her falls, or if there are concerns about her imminent safety.

## 2021-12-18 ENCOUNTER — Other Ambulatory Visit: Payer: Self-pay | Admitting: Family Medicine

## 2021-12-18 DIAGNOSIS — R4189 Other symptoms and signs involving cognitive functions and awareness: Secondary | ICD-10-CM

## 2022-01-02 ENCOUNTER — Other Ambulatory Visit: Payer: Self-pay | Admitting: Family Medicine

## 2022-01-04 ENCOUNTER — Telehealth: Payer: Self-pay

## 2022-01-04 DIAGNOSIS — F0390 Unspecified dementia without behavioral disturbance: Secondary | ICD-10-CM

## 2022-01-04 NOTE — Telephone Encounter (Signed)
Patient's sister calls nurse line regarding concerns with sister.  Reports going up to visit patient this morning. She was still in bed, has not been cleaning house, and overall needs assistance.   Patient and husband are open to placement in assisted living facility. However, they have been unsuccessful in being placed in facility.   Patient was previously having telephone visits with Neoma Laming, LCSW.   Will forward to [name] to see what resources can be provided for patient.  Talbot Grumbling, RN

## 2022-01-09 NOTE — Telephone Encounter (Signed)
Patients sister calls nurse line again requesting status of social work apt.   Will forward to [facility] for advisement.   Please let me know if a new CCM referral needs to be placed.

## 2022-01-10 DIAGNOSIS — I129 Hypertensive chronic kidney disease with stage 1 through stage 4 chronic kidney disease, or unspecified chronic kidney disease: Secondary | ICD-10-CM | POA: Diagnosis not present

## 2022-01-10 DIAGNOSIS — N184 Chronic kidney disease, stage 4 (severe): Secondary | ICD-10-CM | POA: Diagnosis not present

## 2022-01-11 NOTE — Telephone Encounter (Signed)
New CCM referral placed to [name] per request.

## 2022-01-14 ENCOUNTER — Telehealth: Payer: Self-pay | Admitting: *Deleted

## 2022-01-14 NOTE — Chronic Care Management (AMB) (Signed)
°  Care Management   Note  01/14/2022 Name: Megan Stevenson MRN: [id number] DOB: Jul 12, 1941  Megan Stevenson is a 81 y.o. year old female who is a primary care patient of [name], MD and is actively engaged with the care management team. I reached out to Lorenso Courier by phone today to assist with scheduling an initial visit with the BSW  Follow up plan: Telephone appointment with care management team member scheduled for:01/17/22  [place] Management  Direct Dial: [phone]

## 2022-01-17 ENCOUNTER — Ambulatory Visit: Payer: Medicare Other | Admitting: Licensed Clinical Social Worker

## 2022-01-17 ENCOUNTER — Telehealth: Payer: Self-pay | Admitting: Licensed Clinical Social Worker

## 2022-01-17 NOTE — Telephone Encounter (Signed)
°  Care Management   Follow Up Note   01/17/2022 Name: Megan Stevenson MRN: [id number] DOB: 07-Jul-1941   Referred by: [name], MD Reason for referral : No chief complaint on file.   An unsuccessful telephone outreach was attempted today. The patient was referred to the case management team for assistance with care management and care coordination.   Follow Up Plan: The care management team will reach out to the patient again over the next 30 days.   [name], [place]  Social Worker [facility] Care Management  [phone]

## 2022-01-17 NOTE — Patient Instructions (Signed)
Visit Information  Instructions: patient will work with SW to address concerns related to [facility] services  Patient was given the following information about care management and care coordination services today, agreed to services, and gave verbal consent: 1.care management/care coordination services include personalized support from designated clinical staff supervised by their physician, including individualized plan of care and coordination with other care providers 2. 24/7 contact phone numbers for assistance for urgent and routine care needs. 3. The patient may stop care management/care coordination services at any time by phone call to the office staff.  Patient verbalizes understanding of instructions and care plan provided today and agrees to view in [place]. Active MyChart status confirmed with patient.    The care management team will reach out to the patient again over the next 30 days.   Megan Stevenson, [name]  Social Worker [facility] Care Management  [phone]

## 2022-01-17 NOTE — Chronic Care Management (AMB) (Signed)
°  Care Management   Social Work Visit Note  01/17/2022 Name: Megan Stevenson MRN: [id number] DOB: 1941/03/03  Megan Stevenson is a 81 y.o. year old female who sees [name], MD for primary care. The care management team was consulted for assistance with care management and care coordination needs related to [facility] Resources    Patient was given the following information about care management and care coordination services today, agreed to services, and gave verbal consent: 1.care management/care coordination services include personalized support from designated clinical staff supervised by their physician, including individualized plan of care and coordination with other care providers 2. 24/7 contact phone numbers for assistance for urgent and routine care needs. 3. The patient may stop care management/care coordination services at any time by phone call to the office staff.  Engaged with patient by telephone for initial visit in response to provider referral for social work chronic care management and care coordination services.  Assessment: Review of patient history, allergies, and health status during evaluation of patient need for care management/care coordination services.    Interventions:  Patient interviewed and appropriate assessments performed Collaborated with clinical team regarding patient needs  Patient is being cared for by sister- Megan Stevenson. Megan Stevenson has expressed being overwhelmed with caring for her sister in addition to younger children she is the guardian over. SW submitted a PCS application to assist. Megan Stevenson expressed concern of patients blood pressure being frightening high. Megan Stevenson expressed sometimes the patient isn't eating and prefers frozen meals. SW discussed the sodium levels in frozen meals.  Megan Stevenson requested assistance from [facility]. Patient is falling frequently and states patients knee is inverted causing the patient to walk abnormally and  patient is in pain. Patient is falling in showers and requires handrails or shower chair.   SDOH (Social Determinants of Health) assessments performed: Yes     Plan:  SW completed a PCS application. SW sent inbasket requested RNCM outreach. SW will follow up with patient once PCS application is returned from PCP.   [name], [place]  Social Worker [facility] Care Management  [phone]

## 2022-01-21 NOTE — Progress Notes (Signed)
Scheduled with sister 01/22/22 [place] Management  Direct Dial: [phone]

## 2022-01-22 ENCOUNTER — Ambulatory Visit: Payer: Medicare Other

## 2022-01-22 ENCOUNTER — Other Ambulatory Visit: Payer: Self-pay | Admitting: Family Medicine

## 2022-01-22 DIAGNOSIS — F02B0 Dementia in other diseases classified elsewhere, moderate, without behavioral disturbance, psychotic disturbance, mood disturbance, and anxiety: Secondary | ICD-10-CM

## 2022-01-22 DIAGNOSIS — M170 Bilateral primary osteoarthritis of knee: Secondary | ICD-10-CM

## 2022-01-22 DIAGNOSIS — Z9181 History of falling: Secondary | ICD-10-CM

## 2022-01-22 NOTE — Chronic Care Management (AMB) (Signed)
Care Management    RN Visit Note  01/22/2022 Name: Megan Stevenson MRN: [id number] DOB: 1941-01-02  Subjective: Megan Stevenson is a 81 y.o. year old female who is a primary care patient of Megan Dad, MD. The care management team was consulted for assistance with disease management and care coordination needs.    Engaged with patient by telephone for initial visit in response to provider referral for case management and/or care coordination services.   Consent to Services:   Megan Stevenson was given information about Care Management services today including:  Care Management services includes personalized support from designated clinical staff supervised by her physician, including individualized plan of care and coordination with other care providers 24/7 contact phone numbers for assistance for urgent and routine care needs. The patient may stop case management services at any time by phone call to the office staff.  Patient agreed to services and consent obtained.    Summary: Patient's sister provided all information during this encounter. Patient is currently experiencing difficulty with falls and not using her equipment in the home due to dementia.. See Care Plan below for interventions and patient self-care actives.  Recommendation: The patient may benefit from from home health evaluation for safety and health issues.  The sister agrees.  Follow up Plan: Patient would like continued follow-up.  CCM RNCM will outreach the patient within the next 2 weeks.  Patient will call office if needed prior to next encounter   Assessment: Review of patient past medical history, allergies, medications, health status, including review of consultants reports, laboratory and other test data, was performed as part of comprehensive evaluation and provision of chronic care management services.   SDOH (Social Determinants of Health) assessments and interventions performed:  No     Conditions to be  addressed/monitored: Dementia,Osteoarthritis of both knees, at risk for falls and Impaired functional mobility, balance, gait, and endurance  Care Plan : RN Nurse Case Manager  Updates made by Megan Arms, RN since 01/22/2022 12:00 AM     Problem: General Plan of Care in a patient with Dementia,Osteoarthritis of both knees, at risk for falls and Impaired functional mobility, balance, gait, and endurance      Long-Range Goal: The patient will obtain help mobility and health issues.   Start Date: 01/22/2022  Expected End Date: 04/05/2022  Priority: High  Note:   Current Barriers:  Care Coordination needs related to needed DME/Supplies in a patient with Dementia,Osteoarthritis of both knees, at risk for falls and Impaired functional mobility, balance, gait, and endurance  Nurse Case Manager Clinical Goal(s):  Over the next 90 days, patient/family will verbalize understanding of plan to obtain needed DME.  Over the next 90 days, patient will work with care guide and/or [employer]  to address needs related to DME needs.  Interventions: 1:1 collaboration with primary care provider regarding development and update of comprehensive plan of care as evidenced by provider attestation and co-signature Inter-disciplinary care team collaboration (see longitudinal plan of care) Evaluation of current treatment plan related to  self management and patient's adherence to plan as established by provider   DME/ Home health evaluation  (Status: New goal.) Short Term Goal  Evaluation of current treatment plan related to Dementia,Osteoarthritis of both knees, at risk for falls and Impaired functional mobility, balance, gait, and endurance,  self-management and patient's adherence to plan as established by provider. Discussed plans with patient for ongoing care management follow up and provided patient with direct contact information for  care management team Reviewed medications with patient and discussed   Collaborated with PCP regarding Home health referral and DME Order; Discussed plans with patient for ongoing care management follow up and provided patient with direct contact information for care management team; 01/22/22:  I spoke with Mrs. Megan Stevenson; she is Mrs. Megan Stevenson's sister.  She states that the patient has Dementia and both her knees are terrible.  She has had some falls.  She has the equipment needed in the home but will not use it.  Her husband is constrained to the bed, and their son stays there, also.  Mrs. Megan Stevenson comes to help her sister the best she can, but she is elderly as well and has a family to help.   Patient Goals/Self Care Activities: -Patient/Caregiver will self-administer medications as prescribed as evidenced by self-report/primary caregiver report  -Patient/Caregiver will attend all scheduled provider appointments as evidenced by clinician review of documented attendance to scheduled appointments and patient/caregiver report -Patient/Caregiver will call pharmacy for medication refills as evidenced by patient report and review of pharmacy fill history as appropriate -Patient/Caregiver will call provider office for new concerns or questions as evidenced by review of documented incoming telephone call notes and patient report -Patient/Caregiver verbalizes understanding of plan -Patient/Caregiver will focus on medication adherence by taking medications as prescribed -Monitor for the call from home health -Try to have her use the equipment she haas in the home to prevent falls ( I.E. walker, wheelchair)      Megan Arms RN, BSN, [place] Management Coordinator [place]  Phone: [phone]

## 2022-01-22 NOTE — Patient Instructions (Signed)
Megan Stevenson  it was nice speaking with you. Please call me directly [phone] if you have questions about the goals we discussed.  Care Plan : RN Nurse Case Manager  Updates made by Megan Arms, RN since 01/22/2022 12:00 AM     Problem: General Plan of Care in a patient with Dementia,Osteoarthritis of both knees, at risk for falls and Impaired functional mobility, balance, gait, and endurance      Long-Range Goal: The patient will obtain help mobility and health issues.   Start Date: 01/22/2022  Expected End Date: 04/05/2022  Priority: High  Note:   Current Barriers:  Care Coordination needs related to needed DME/Supplies in a patient with Dementia,Osteoarthritis of both knees, at risk for falls and Impaired functional mobility, balance, gait, and endurance  Nurse Case Manager Clinical Goal(s):  Over the next 90 days, patient/family will verbalize understanding of plan to obtain needed DME.  Over the next 90 days, patient will work with care guide and/or [employer]  to address needs related to DME needs.  Interventions: 1:1 collaboration with primary care provider regarding development and update of comprehensive plan of care as evidenced by provider attestation and co-signature Inter-disciplinary care team collaboration (see longitudinal plan of care) Evaluation of current treatment plan related to  self management and patient's adherence to plan as established by provider   DME/ Home health evaluation  (Status: New goal.) Short Term Goal  Evaluation of current treatment plan related to Dementia,Osteoarthritis of both knees, at risk for falls and Impaired functional mobility, balance, gait, and endurance,  self-management and patient's adherence to plan as established by provider. Discussed plans with patient for ongoing care management follow up and provided patient with direct contact information for care management team Reviewed medications with patient and discussed  Collaborated with  PCP regarding Home health referral and DME Order; Discussed plans with patient for ongoing care management follow up and provided patient with direct contact information for care management team; 01/22/22:  I spoke with Megan Stevenson; she is Megan Stevenson's sister.  She states that the patient has Dementia and both her knees are terrible.  She has had some falls.  She has the equipment needed in the home but will not use it.  Her husband is constrained to the bed, and their son stays there, also.  Megan Stevenson comes to help her sister the best she can, but she is elderly as well and has a family to help.   Patient Goals/Self Care Activities: -Patient/Caregiver will self-administer medications as prescribed as evidenced by self-report/primary caregiver report  -Patient/Caregiver will attend all scheduled provider appointments as evidenced by clinician review of documented attendance to scheduled appointments and patient/caregiver report -Patient/Caregiver will call pharmacy for medication refills as evidenced by patient report and review of pharmacy fill history as appropriate -Patient/Caregiver will call provider office for new concerns or questions as evidenced by review of documented incoming telephone call notes and patient report -Patient/Caregiver verbalizes understanding of plan -Patient/Caregiver will focus on medication adherence by taking medications as prescribed -Monitor for the call from home health -Try to have her use the equipment she haas in the home to prevent falls ( I.E. walker, wheelchair)         Megan Stevenson received Care Management services today:  Care Management services include personalized support from designated clinical staff supervised by her physician, including individualized plan of care and coordination with other care providers 24/7 contact [phone] for assistance for urgent and routine care needs.  Care Management are voluntary services and be declined at  any time by calling the office.  The patient verbalized understanding of instructions provided today and agreed to receive a mailed copy of patient instruction and/or educational materials.   Follow Up Plan: Patient would like continued follow-up.  CCM RNCM will outreach the patient within the next 2 weeks  Patient will call office if needed prior to next encounter   Megan Arms, RN   [phone]

## 2022-01-23 ENCOUNTER — Telehealth: Payer: Medicare Other

## 2022-01-23 ENCOUNTER — Other Ambulatory Visit: Payer: Self-pay

## 2022-01-23 MED ORDER — ATORVASTATIN CALCIUM 10 MG PO TABS: 10.0000 mg | ORAL_TABLET | Freq: Every day | ORAL | 3 refills | Status: DC

## 2022-01-24 DIAGNOSIS — I10 Essential (primary) hypertension: Secondary | ICD-10-CM | POA: Diagnosis not present

## 2022-01-24 DIAGNOSIS — N184 Chronic kidney disease, stage 4 (severe): Secondary | ICD-10-CM | POA: Diagnosis not present

## 2022-01-25 DIAGNOSIS — E1122 Type 2 diabetes mellitus with diabetic chronic kidney disease: Secondary | ICD-10-CM | POA: Diagnosis not present

## 2022-01-25 DIAGNOSIS — F32A Depression, unspecified: Secondary | ICD-10-CM | POA: Diagnosis not present

## 2022-01-25 DIAGNOSIS — E785 Hyperlipidemia, unspecified: Secondary | ICD-10-CM | POA: Diagnosis not present

## 2022-01-25 DIAGNOSIS — F03B3 Unspecified dementia, moderate, with mood disturbance: Secondary | ICD-10-CM | POA: Diagnosis not present

## 2022-01-25 DIAGNOSIS — N189 Chronic kidney disease, unspecified: Secondary | ICD-10-CM | POA: Diagnosis not present

## 2022-01-25 DIAGNOSIS — I129 Hypertensive chronic kidney disease with stage 1 through stage 4 chronic kidney disease, or unspecified chronic kidney disease: Secondary | ICD-10-CM | POA: Diagnosis not present

## 2022-01-25 DIAGNOSIS — M170 Bilateral primary osteoarthritis of knee: Secondary | ICD-10-CM | POA: Diagnosis not present

## 2022-01-31 DIAGNOSIS — M170 Bilateral primary osteoarthritis of knee: Secondary | ICD-10-CM | POA: Diagnosis not present

## 2022-01-31 DIAGNOSIS — I129 Hypertensive chronic kidney disease with stage 1 through stage 4 chronic kidney disease, or unspecified chronic kidney disease: Secondary | ICD-10-CM | POA: Diagnosis not present

## 2022-01-31 DIAGNOSIS — E1122 Type 2 diabetes mellitus with diabetic chronic kidney disease: Secondary | ICD-10-CM | POA: Diagnosis not present

## 2022-01-31 DIAGNOSIS — F03B3 Unspecified dementia, moderate, with mood disturbance: Secondary | ICD-10-CM | POA: Diagnosis not present

## 2022-01-31 DIAGNOSIS — F32A Depression, unspecified: Secondary | ICD-10-CM | POA: Diagnosis not present

## 2022-01-31 DIAGNOSIS — N189 Chronic kidney disease, unspecified: Secondary | ICD-10-CM | POA: Diagnosis not present

## 2022-02-01 DIAGNOSIS — F03B3 Unspecified dementia, moderate, with mood disturbance: Secondary | ICD-10-CM | POA: Diagnosis not present

## 2022-02-01 DIAGNOSIS — N189 Chronic kidney disease, unspecified: Secondary | ICD-10-CM | POA: Diagnosis not present

## 2022-02-01 DIAGNOSIS — E1122 Type 2 diabetes mellitus with diabetic chronic kidney disease: Secondary | ICD-10-CM | POA: Diagnosis not present

## 2022-02-01 DIAGNOSIS — M170 Bilateral primary osteoarthritis of knee: Secondary | ICD-10-CM | POA: Diagnosis not present

## 2022-02-01 DIAGNOSIS — F32A Depression, unspecified: Secondary | ICD-10-CM | POA: Diagnosis not present

## 2022-02-01 DIAGNOSIS — I129 Hypertensive chronic kidney disease with stage 1 through stage 4 chronic kidney disease, or unspecified chronic kidney disease: Secondary | ICD-10-CM | POA: Diagnosis not present

## 2022-02-02 DIAGNOSIS — M170 Bilateral primary osteoarthritis of knee: Secondary | ICD-10-CM | POA: Diagnosis not present

## 2022-02-02 DIAGNOSIS — E1122 Type 2 diabetes mellitus with diabetic chronic kidney disease: Secondary | ICD-10-CM | POA: Diagnosis not present

## 2022-02-02 DIAGNOSIS — F03B3 Unspecified dementia, moderate, with mood disturbance: Secondary | ICD-10-CM | POA: Diagnosis not present

## 2022-02-02 DIAGNOSIS — I129 Hypertensive chronic kidney disease with stage 1 through stage 4 chronic kidney disease, or unspecified chronic kidney disease: Secondary | ICD-10-CM | POA: Diagnosis not present

## 2022-02-02 DIAGNOSIS — N189 Chronic kidney disease, unspecified: Secondary | ICD-10-CM | POA: Diagnosis not present

## 2022-02-02 DIAGNOSIS — F32A Depression, unspecified: Secondary | ICD-10-CM | POA: Diagnosis not present

## 2022-02-04 ENCOUNTER — Ambulatory Visit: Payer: Medicare Other

## 2022-02-04 NOTE — Chronic Care Management (AMB) (Signed)
Chronic Care Management   CCM RN Visit Note  02/04/2022 Name: Megan Stevenson MRN: [id number] DOB: Mar 17, 1941  Subjective: Megan Stevenson is a 81 y.o. year old female who is a primary care patient of Megan Dad, MD. The care management team was consulted for assistance with disease management and care coordination needs.    Engaged with patient by telephone for follow up visit in response to provider referral for case management and/or care coordination services.   Consent to Services:  The patient was given information about Chronic Care Management services, agreed to services, and gave verbal consent prior to initiation of services.  Please see initial visit note for detailed documentation.   Patient agreed to services and verbal consent obtained.    Summary: Patient's sister provided all information during this encounter. The patient has started to work with physical therapy to help build her strength and she will continue to monitor  her progress. . See Care Plan below for interventions and patient self-care actives.  Recommendation: The patient may benefit from working with home health physical therapy, taking her medications as prescribed and having aide in the home. The sister agrees.  Follow up Plan: Patient would like continued follow-up.  CCM RNCM will outreach the patient within the next 3 weeks.  Patient will call office if needed prior to next encounter   Assessment: Review of patient past medical history, allergies, medications, health status, including review of consultants reports, laboratory and other test data, was performed as part of comprehensive evaluation and provision of chronic care management services.   SDOH (Social Determinants of Health) assessments and interventions performed:  No  CCM Care Plan  Allergies  Allergen Reactions   Nsaids Other (See Comments)    REACTION: Has chronic renal disease   Benazepril Hcl Other (See Comments)    REACTION:  Dizziness   Furosemide Other (See Comments)    REACTION: Hypernatremia   Atenolol Other (See Comments)    REACTION: Headaches    Outpatient Encounter Medications as of 02/04/2022  Medication Sig   allopurinol (ZYLOPRIM) 100 MG tablet TAKE 1 TABLET BY MOUTH EVERY DAY   amLODipine (NORVASC) 10 MG tablet Take 10 mg by mouth daily.   atorvastatin (LIPITOR) 10 MG tablet Take 1 tablet (10 mg total) by mouth at bedtime.   cloNIDine (CATAPRES) 0.2 MG tablet Take 1 tablet (0.2 mg total) by mouth daily.   diclofenac Sodium (VOLTAREN) 1 % GEL Apply to right knee 2-3 times daily as needed for pain   donepezil (ARICEPT) 10 MG tablet TAKE 1 TABLET(10 MG) BY MOUTH DAILY   mupirocin cream (BACTROBAN) 2 % Apply 1 application topically 2 (two) times daily.   No facility-administered encounter medications on file as of 02/04/2022.    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   Macular hole, 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   OSTEOARTHRITIS, 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   Hyperlipidemia associated with type 2 diabetes mellitus ([place]) 07/24/2007   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   Hypertension  associated with diabetes ([place]) 07/24/2007    Conditions to be addressed/monitored: Falls and DME  Care Plan : RN Nurse Case Manager  Updates made by Lazaro Arms, RN since 02/04/2022 12:00 AM     Problem: General Plan of Care in a patient with Dementia,Osteoarthritis of both knees, at risk for falls and Impaired  functional mobility, balance, gait, and endurance      Long-Range Goal: The patient will obtain help mobility and health issues.   Start Date: 01/22/2022  Expected End Date: 04/05/2022  Priority: High  Note:   Current Barriers:  Care Coordination needs related to needed DME/Supplies in a patient with Dementia,Osteoarthritis of both knees, at risk for falls and Impaired functional mobility, balance, gait, and endurance  Nurse Case Manager Clinical Goal(s):  Over the next 90 days, patient/family will verbalize understanding of plan to obtain needed DME.  Over the next 90 days, patient will work with care guide and/or [employer]  to address needs related to DME needs.  Interventions: 1:1 collaboration with primary care provider regarding development and update of comprehensive plan of care as evidenced by provider attestation and co-signature Inter-disciplinary care team collaboration (see longitudinal plan of care) Evaluation of current treatment plan related to  self management and patient's adherence to plan as established by provider   DME/ Home health evaluation  (Status: New goal.) Short Term Goal  Evaluation of current treatment plan related to Dementia,Osteoarthritis of both knees, at risk for falls and Impaired functional mobility, balance, gait, and endurance,  self-management and patient's adherence to plan as established by provider. Discussed plans with patient for ongoing care management follow up and provided patient with direct contact information for care management team Reviewed medications with patient and discussed  Discussed plans with patient for ongoing care management follow up and provided patient with direct contact information for care management team; 02/04/22:  Mrs. Megan Stevenson stated that Home health is coming to see her sister. I asked [name] if she could inform me of her sister's progress, and she said she would. Our referral coordinator told me that Megan Stevenson started on February 17th--the order was placed for Nursing, Physical Therapy, and an Aide.    Patient Goals/Self Care Activities: -Patient/Caregiver will self-administer medications as prescribed as evidenced by self-report/primary caregiver report  -Patient/Caregiver will attend all scheduled provider appointments as evidenced by clinician review of documented attendance to scheduled appointments and patient/caregiver report -Patient/Caregiver will call pharmacy for medication refills as evidenced by patient report and review of pharmacy fill history as appropriate -Patient/Caregiver will call provider office for new concerns or questions as evidenced by review of documented incoming telephone call notes and patient report -Patient/Caregiver verbalizes understanding of plan -Patient/Caregiver will focus on medication adherence by taking medications as prescribed -Sister will monitor home health -Work with Physical Therapy     Lazaro Arms RN, BSN, [facility] Management Coordinator [facility]  Phone: [phone]

## 2022-02-04 NOTE — Patient Instructions (Signed)
Visit Information  Megan Stevenson  it was nice speaking with you. Please call me directly [phone] if you have questions about the goals we discussed.  Patient Goals/Self Care Activities: -Patient/Caregiver will self-administer medications as prescribed as evidenced by self-report/primary caregiver report  -Patient/Caregiver will attend all scheduled provider appointments as evidenced by clinician review of documented attendance to scheduled appointments and patient/caregiver report -Patient/Caregiver will call pharmacy for medication refills as evidenced by patient report and review of pharmacy fill history as appropriate -Patient/Caregiver will call provider office for new concerns or questions as evidenced by review of documented incoming telephone call notes and patient report -Patient/Caregiver verbalizes understanding of plan -Patient/Caregiver will focus on medication adherence by taking medications as prescribed -Sister will monitor home health -Work with Physical Therapy           The patient verbalized understanding of instructions, educational materials, and care plan provided today and agreed to receive a mailed copy of patient instructions, educational materials, and care plan.   Follow up Plan: Patient would like continued follow-up.  CCM RNCM will outreach the patient within the next 3 weeks  Patient will call office if needed prior to next encounter  Lazaro Arms, RN  [phone]

## 2022-02-05 DIAGNOSIS — M170 Bilateral primary osteoarthritis of knee: Secondary | ICD-10-CM | POA: Diagnosis not present

## 2022-02-05 DIAGNOSIS — F03B3 Unspecified dementia, moderate, with mood disturbance: Secondary | ICD-10-CM | POA: Diagnosis not present

## 2022-02-05 DIAGNOSIS — F32A Depression, unspecified: Secondary | ICD-10-CM | POA: Diagnosis not present

## 2022-02-05 DIAGNOSIS — E1122 Type 2 diabetes mellitus with diabetic chronic kidney disease: Secondary | ICD-10-CM | POA: Diagnosis not present

## 2022-02-05 DIAGNOSIS — I129 Hypertensive chronic kidney disease with stage 1 through stage 4 chronic kidney disease, or unspecified chronic kidney disease: Secondary | ICD-10-CM | POA: Diagnosis not present

## 2022-02-05 DIAGNOSIS — N189 Chronic kidney disease, unspecified: Secondary | ICD-10-CM | POA: Diagnosis not present

## 2022-02-07 ENCOUNTER — Other Ambulatory Visit: Payer: Self-pay

## 2022-02-07 ENCOUNTER — Observation Stay (HOSPITAL_COMMUNITY)
Admission: EM | Admit: 2022-02-07 | Discharge: 2022-02-08 | Disposition: A | Discharge status: Home / Self Care | Payer: Medicare Other | Attending: Family Medicine | Admitting: Family Medicine

## 2022-02-07 ENCOUNTER — Encounter (HOSPITAL_COMMUNITY): Payer: Self-pay

## 2022-02-07 DIAGNOSIS — Z20822 Contact with and (suspected) exposure to covid-19: Secondary | ICD-10-CM | POA: Insufficient documentation

## 2022-02-07 DIAGNOSIS — I129 Hypertensive chronic kidney disease with stage 1 through stage 4 chronic kidney disease, or unspecified chronic kidney disease: Secondary | ICD-10-CM | POA: Diagnosis not present

## 2022-02-07 DIAGNOSIS — E1122 Type 2 diabetes mellitus with diabetic chronic kidney disease: Secondary | ICD-10-CM | POA: Diagnosis not present

## 2022-02-07 DIAGNOSIS — D631 Anemia in chronic kidney disease: Secondary | ICD-10-CM | POA: Diagnosis not present

## 2022-02-07 DIAGNOSIS — R531 Weakness: Secondary | ICD-10-CM | POA: Diagnosis not present

## 2022-02-07 DIAGNOSIS — N184 Chronic kidney disease, stage 4 (severe): Secondary | ICD-10-CM | POA: Insufficient documentation

## 2022-02-07 DIAGNOSIS — F0390 Unspecified dementia without behavioral disturbance: Secondary | ICD-10-CM | POA: Insufficient documentation

## 2022-02-07 DIAGNOSIS — Z87891 Personal history of nicotine dependence: Secondary | ICD-10-CM | POA: Insufficient documentation

## 2022-02-07 DIAGNOSIS — D649 Anemia, unspecified: Secondary | ICD-10-CM | POA: Diagnosis not present

## 2022-02-07 DIAGNOSIS — N189 Chronic kidney disease, unspecified: Secondary | ICD-10-CM | POA: Diagnosis not present

## 2022-02-07 DIAGNOSIS — Z79899 Other long term (current) drug therapy: Secondary | ICD-10-CM | POA: Diagnosis not present

## 2022-02-07 DIAGNOSIS — E538 Deficiency of other specified B group vitamins: Secondary | ICD-10-CM | POA: Diagnosis not present

## 2022-02-07 DIAGNOSIS — D509 Iron deficiency anemia, unspecified: Principal | ICD-10-CM | POA: Insufficient documentation

## 2022-02-07 LAB — PREPARE RBC (CROSSMATCH)

## 2022-02-07 LAB — CBC
HCT: 22.5 % — ABNORMAL LOW (ref 36.0–46.0)
Hemoglobin: 6.5 g/dL — CL (ref 12.0–15.0)
MCH: 19.2 pg — ABNORMAL LOW (ref 26.0–34.0)
MCHC: 28.9 g/dL — ABNORMAL LOW (ref 30.0–36.0)
MCV: 66.6 fL — ABNORMAL LOW (ref 80.0–100.0)
Platelets: 366 10*3/uL (ref 150–400)
RBC: 3.38 MIL/uL — ABNORMAL LOW (ref 3.87–5.11)
RDW: 24.4 % — ABNORMAL HIGH (ref 11.5–15.5)
WBC: 6.2 10*3/uL (ref 4.0–10.5)
nRBC: 0 % (ref 0.0–0.2)

## 2022-02-07 LAB — COMPREHENSIVE METABOLIC PANEL
ALT: 44 U/L (ref 0–44)
AST: 27 U/L (ref 15–41)
Albumin: 3.9 g/dL (ref 3.5–5.0)
Alkaline Phosphatase: 64 U/L (ref 38–126)
Anion gap: 9 (ref 5–15)
BUN: 35 mg/dL — ABNORMAL HIGH (ref 8–23)
CO2: 19 mmol/L — ABNORMAL LOW (ref 22–32)
Calcium: 10 mg/dL (ref 8.9–10.3)
Chloride: 114 mmol/L — ABNORMAL HIGH (ref 98–111)
Creatinine, Ser: 2.77 mg/dL — ABNORMAL HIGH (ref 0.44–1.00)
GFR, Estimated: 17 mL/min — ABNORMAL LOW (ref >60)
Glucose, Bld: 89 mg/dL (ref 70–99)
Potassium: 4.2 mmol/L (ref 3.5–5.1)
Sodium: 142 mmol/L (ref 135–145)
Total Bilirubin: 0.1 mg/dL — ABNORMAL LOW (ref 0.3–1.2)
Total Protein: 6.7 g/dL (ref 6.5–8.1)

## 2022-02-07 LAB — VITAMIN B12: Vitamin B-12: 262 pg/mL (ref 180–914)

## 2022-02-07 LAB — RETICULOCYTES
Immature Retic Fract: 25 % — ABNORMAL HIGH (ref 2.3–15.9)
RBC.: 4.25 MIL/uL (ref 3.87–5.11)
Retic Count, Absolute: 59.1 10*3/uL (ref 19.0–186.0)
Retic Ct Pct: 1.4 % (ref 0.4–3.1)

## 2022-02-07 LAB — RESP PANEL BY RT-PCR (FLU A&B, COVID) ARPGX2
Influenza A by PCR: NEGATIVE
Influenza B by PCR: NEGATIVE
SARS Coronavirus 2 by RT PCR: NEGATIVE

## 2022-02-07 LAB — IRON AND TIBC
Iron: 19 ug/dL — ABNORMAL LOW (ref 28–170)
Saturation Ratios: 5 % — ABNORMAL LOW (ref 10.4–31.8)
TIBC: 379 ug/dL (ref 250–450)
UIBC: 360 ug/dL

## 2022-02-07 LAB — ABO/RH: ABO/RH(D): A POS

## 2022-02-07 LAB — HEMOGLOBIN AND HEMATOCRIT, BLOOD
HCT: 23.8 % — ABNORMAL LOW (ref 36.0–46.0)
Hemoglobin: 7.1 g/dL — ABNORMAL LOW (ref 12.0–15.0)

## 2022-02-07 LAB — FERRITIN: Ferritin: 5 ng/mL — ABNORMAL LOW (ref 11–307)

## 2022-02-07 MED ORDER — AMLODIPINE BESYLATE 5 MG PO TABS
10.0000 mg | ORAL_TABLET | Freq: Every day | ORAL | Status: DC
  Administered 2022-02-07 – 2022-02-08 (×2): 10 mg via ORAL
  Filled 2022-02-07 (×2): qty 2

## 2022-02-07 MED ORDER — ACETAMINOPHEN 650 MG RE SUPP: 650.0000 mg | Freq: Four times a day (QID) | RECTAL | Status: DC | PRN

## 2022-02-07 MED ORDER — ATORVASTATIN CALCIUM 10 MG PO TABS
10.0000 mg | ORAL_TABLET | Freq: Every day | ORAL | Status: DC
  Administered 2022-02-07: 10 mg via ORAL
  Filled 2022-02-07: qty 1

## 2022-02-07 MED ORDER — DONEPEZIL HCL 10 MG PO TABS
10.0000 mg | ORAL_TABLET | Freq: Every day | ORAL | Status: DC
  Administered 2022-02-07: 10 mg via ORAL
  Filled 2022-02-07 (×2): qty 1

## 2022-02-07 MED ORDER — ACETAMINOPHEN 325 MG PO TABS: 650.0000 mg | ORAL_TABLET | Freq: Four times a day (QID) | ORAL | Status: DC | PRN

## 2022-02-07 MED ORDER — SODIUM CHLORIDE 0.9% IV SOLUTION: Freq: Once | INTRAVENOUS | Status: AC

## 2022-02-07 MED ORDER — CLONIDINE HCL 0.2 MG PO TABS
0.2000 mg | ORAL_TABLET | Freq: Every day | ORAL | Status: DC
  Administered 2022-02-07 – 2022-02-08 (×2): 0.2 mg via ORAL
  Filled 2022-02-07 (×2): qty 1

## 2022-02-07 NOTE — ED Triage Notes (Signed)
Pt sent from [facility] for possible blood transfusion/to have blood work drawn. Pt endorses generalized weakness x 2 days. Denies blood in stool or other sources of bleeding.  ?

## 2022-02-07 NOTE — ED Provider Notes (Signed)
?[facility] ?Provider Note ? ? ?CSN: [id number] ?Arrival date & time: 02/07/22  1339 ? ?  ? ?History ? ?Chief Complaint  ?Patient presents with  ? Weakness  ? ? ?Megan Stevenson is a 81 y.o. female. ? ?81 yo F with a cc of weakness.  Going on for about a week.  Just feels fatigued.  Denies other symptoms.  Denies cp, sob, abdominal pain, n/v/d, fevers, chills cough congestion.  Denies medication change.  Denies diet change.  Saw her nephrologist today who on routine labs noted a drop in her hbg.  Sent here for transfusion.  Denies blood in her stool.  Denies dark stool.   ? ? ?Weakness ? ?  ? ?Home Medications ?Prior to Admission medications   ?Medication Sig Start Date End Date Taking? Authorizing Provider  ?allopurinol (ZYLOPRIM) 100 MG tablet TAKE 1 TABLET BY MOUTH EVERY DAY 01/03/22   Ezequiel Essex, MD  ?amLODipine (NORVASC) 10 MG tablet Take 10 mg by mouth daily. 09/03/21   [provider]  ?atorvastatin (LIPITOR) 10 MG tablet Take 1 tablet (10 mg total) by mouth at bedtime. 01/23/22   Sharion Settler, DO  ?cloNIDine (CATAPRES) 0.2 MG tablet Take 1 tablet (0.2 mg total) by mouth daily. 10/30/21   [name], MD  ?diclofenac Sodium (VOLTAREN) 1 % GEL Apply to right knee 2-3 times daily as needed for pain 08/07/21   [name], MD  ?donepezil (ARICEPT) 10 MG tablet TAKE 1 TABLET(10 MG) BY MOUTH DAILY 12/18/21   [name], MD  ?mupirocin cream (BACTROBAN) 2 % Apply 1 application topically 2 (two) times daily. 08/02/21   Rodriguez-Southworth, Sunday Spillers, PA-C  ?   ? ?Allergies    ?Nsaids, Benazepril hcl, Furosemide, and Atenolol   ? ?Review of Systems   ?Review of Systems  ?Neurological:  Positive for weakness.  ? ?Physical Exam ?Updated Vital Signs ?BP (!) 156/64 (BP Location: Left Arm)   Pulse 60   Temp 98.2 ?F (36.8 ?C) (Oral)   Resp 12   Ht 5\' 9"  (1.753 m)   Wt 68 kg   SpO2 100%   BMI 22.15 kg/m?  ?Physical Exam ?Vitals and nursing note reviewed.   ?Constitutional:   ?   General: She is not in acute distress. ?   Appearance: She is well-developed. She is not diaphoretic.  ?HENT:  ?   Head: Normocephalic and atraumatic.  ?Eyes:  ?   Pupils: Pupils are equal, round, and reactive to light.  ?Cardiovascular:  ?   Rate and Rhythm: Normal rate and regular rhythm.  ?   Heart sounds: No murmur heard. ?  No friction rub. No gallop.  ?Pulmonary:  ?   Effort: Pulmonary effort is normal.  ?   Breath sounds: No wheezing or rales.  ?Abdominal:  ?   General: There is no distension.  ?   Palpations: Abdomen is soft.  ?   Tenderness: There is no abdominal tenderness.  ?Musculoskeletal:     ?   General: No tenderness.  ?   Cervical back: Normal range of motion and neck supple.  ?Skin: ?   General: Skin is warm and dry.  ?Neurological:  ?   Mental Status: She is alert and oriented to person, place, and time.  ?Psychiatric:     ?   Behavior: Behavior normal.  ? ? ?ED Results / Procedures / Treatments   ?Labs ?(all labs ordered are listed, but only abnormal results are displayed) ?Labs Reviewed  ?  COMPREHENSIVE METABOLIC PANEL - Abnormal; Notable for the following components:  ?    Result Value  ? Chloride 114 (*)   ? CO2 19 (*)   ? BUN 35 (*)   ? Creatinine, Ser 2.77 (*)   ? Total Bilirubin 0.1 (*)   ? GFR, Estimated 17 (*)   ? All other components within normal limits  ?CBC - Abnormal; Notable for the following components:  ? RBC 3.38 (*)   ? Hemoglobin 6.5 (*)   ? HCT 22.5 (*)   ? MCV 66.6 (*)   ? MCH 19.2 (*)   ? MCHC 28.9 (*)   ? RDW 24.4 (*)   ? All other components within normal limits  ?VITAMIN B12  ?FOLATE  ?IRON AND TIBC  ?FERRITIN  ?RETICULOCYTES  ?TYPE AND SCREEN  ?ABO/RH  ?PREPARE RBC (CROSSMATCH)  ? ? ?EKG ?None ? ?Radiology ?No results found. ? ?Procedures ?Procedures  ? ? ?Medications Ordered in ED ?Medications  ?0.9 %  sodium chloride infusion (Manually program via Guardrails IV Fluids) (has no administration in time range)  ? ? ?ED Course/ Medical Decision  Making/ A&P ?  ?                        ?Medical Decision Making ?Amount and/or Complexity of Data Reviewed ?Labs: ordered. ? ?Risk ?Prescription drug management. ? ? ?81 yo F with a cc of fatigue.  Found to have a hbg of 6.5. looks like trending downward from 8 over a couple years.   Microcytic, progressive worsening renal function. BUN mildly elevated. Denies blood in stool, patient declining rectal exam.  Order unit of blood.  Will discuss with medicine. ? ?CRITICAL CARE ?Performed by: Cecilio Asper ? ? ?Total critical care time: 35 minutes ? ?Critical care time was exclusive of separately billable procedures and treating other patients. ? ?Critical care was necessary to treat or prevent imminent or life-threatening deterioration. ? ?Critical care was time spent personally by me on the following activities: development of treatment plan with patient and/or surrogate as well as nursing, discussions with consultants, evaluation of patient's response to treatment, examination of patient, obtaining history from patient or surrogate, ordering and performing treatments and interventions, ordering and review of laboratory studies, ordering and review of radiographic studies, pulse oximetry and re-evaluation of patient's condition. ? ?The patients results and plan were reviewed and discussed.   ?Any x-rays performed were independently reviewed by myself.  ? ?Differential diagnosis were considered with the presenting HPI. ? ?Medications  ?0.9 %  sodium chloride infusion (Manually program via Guardrails IV Fluids) (has no administration in time range)  ? ? ?Vitals:  ? 02/07/22 1403 02/07/22 1712 02/07/22 1742 02/07/22 1744  ?BP: (!) 147/59 (!) 143/61 (!) 156/64   ?Pulse: 62 (!) 53 60   ?Resp: 16 14 12    ?Temp: 98 ?F (36.7 ?C) 98.1 ?F (36.7 ?C) 98.2 ?F (36.8 ?C)   ?TempSrc: Oral  Oral   ?SpO2: 100% 100% 100%   ?Weight:    68 kg  ?Height:    5\' 9"  (1.753 m)  ? ? ?Final diagnoses:  ?Symptomatic anemia  ? ? ?Admission/  observation were discussed with the admitting physician, patient and/or family and they are comfortable with the plan.  ? ? ? ? ? ? ? ? ?Final Clinical Impression(s) / ED Diagnoses ?Final diagnoses:  ?Symptomatic anemia  ? ? ?Rx / DC Orders ?ED Discharge Orders   ? ? None  ? ?  ? ? ?  ?  Deno Etienne, DO ?02/07/22 1800 ? ?

## 2022-02-07 NOTE — H&P (Signed)
[facility] Admission History and Physical Service Pager: [phone]  Patient name: Megan Stevenson Medical record number: [id number] Date of birth: Apr 29, 1941 Age: 81 y.o. Gender: female  Primary Care Provider: [name], MD Consultants: None Code Status: Full Preferred Emergency Contact: Welford Roche (Sister) [phone]  Chief Complaint: Generalized Weakness  Assessment and Plan: Megan Stevenson is a 81 y.o. female presenting from [facility] with 2 day history of generalized weakness . PMH is significant for moderate stage dementia (06/14/21 MoCA 5/30).  Microcytic Anemia likely 2/2 chronic kidney disease Patient presents with 1-2 weeks of increased weakness and dizziness. Saw nephrology earlier today and was found to have a drop in her hemoglobin and was sent to [facility] for transfusion. On arrival Hgb 6.5. On exam patient feeling improved and denies weakness though she had not yet received her transfusion. Anemia likely due to anemia of chronic disease as she has worsening CKD stage 4. She denies blood in stool and declined exam from ED provider. Admitting patient for transfusion and observation, anticipate discharge tomorrow if no concern for acute active bleeding - admit to FPTS attending Dr. Erin Hearing - transfuse 1 u pRBC - HH - Vit B12, Folate - Anemia panel  - am BMP, CBC - vitals per floor - Tylenol 650mg  prm    HTN BP 159/97. Home medication includes clonidine 0.2mg  tablet daily, amlodipine 10mg .  - continue home meds   CKD Cr 2.77, GFR 17. Downward progression over last year. Baseline Cr ~ 2.4 - am BMP - avoid nephrotoxic agents   Dementia MoCA 5/30 on 06/14/21. Today patient alert to name and location but not year and could not recall president (stated it was Eulas Post). Currently taking Donepezil 10mg  - continue Donepezil    FEN/GI: NPO  Prophylaxis: None   Disposition: med-surg   History of Present Illness:  Megan Stevenson is  a 80 y.o. female presenting with 2 day hx of generalized weakness. PMHx significant for moderate stage dementia (on donepezil), HLD, HTN, gout, osteoarthritis, CKD stage IV, T2DM,   States she has been feeling weak and dizzy for the past 2 weeks. Was seen by kidney doctor a week ago and stated that her iron was low. Was seen again today and blood level was low and was advised to come to ED FOR   Denies N/V, diarrhea, constipation. Denies pain numbness tingling   Review Of Systems: Per HPI   Review of Systems   Patient Active Problem List   Diagnosis Date Noted   Anemia 02/07/2022   Hearing screen with abnormal findings 06/18/2021   At risk for falls 06/18/2021   Sarcopenia 06/18/2021   Impaired functional mobility, balance, gait, and endurance 06/18/2021   Dementia ([place]) 04/17/2021   Osteoarthritis of both knees 04/17/2021   Type 2 diabetes mellitus with diabetic chronic kidney disease ([place]) 06/13/2018   CKD (chronic kidney disease) stage 4, GFR 15-29 ml/min (HCC) 06/13/2018   Microcytic anemia 06/13/2018   Heart murmur 06/13/2018   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   Macular hole, 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   OSTEOARTHRITIS, 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   Hyperlipidemia associated with type 2 diabetes mellitus ([place]) 07/24/2007   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   Hypertension associated with diabetes ([place]) 07/24/2007    Past Medical History: Past Medical History:  Diagnosis Date   Anemia    Arthritis    Ataxia 06/13/2018   Bradycardia 06/13/2018   Bradycardia, sinus 06/13/2018   CKD (chronic kidney disease) stage 4, GFR 15-29  ml/min ([place]) 06/13/2018   Cognitive decline    COLONIC POLYPS, HX OF 07/24/2007   Annotation: 6/06 Qualifier: History of  By: Radene Ou MD, [place]     Diabetes mellitus without complication ([place])    no medications   DIVERTICULOSIS, SIGMOID COLON 07/24/2007   Qualifier: Diagnosis of  By: Radene Ou MD, [place]     Dysrhythmia    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    Qualifier: Diagnosis of  By: Radene Ou MD, [place]     Hyperlipidemia associated with type 2 diabetes mellitus ([place]) 07/24/2007   Qualifier: Diagnosis of  By: Radene Ou MD, [place]     Hypertension    Hypertension associated with diabetes ([place]) 07/24/2007   Qualifier: Diagnosis of  By: Radene Ou MD, [place]     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   Brain MRI 06/13/2018: Small deep white matter and basal ganglia lacunar infarcts   Macular hole    OD   Macular hole, 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   Metatarsal fracture, Right foot 08/25/2020   Osteoarthritis of right knee 04/17/2021   PUD 07/24/2007   Annotation: 10/97 Qualifier: Diagnosis of  By: Radene Ou MD, [place]     Renal disorder    Sees someone [facility]   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   ROTATOR CUFF SYNDROME, 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   Qualifier: Diagnosis of  By: Radene Ou MD, Jordan Hawks     SUBACROMIAL BURSITIS, 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   Qualifier: Diagnosis of  By: Radene Ou MD, [place]     Type 2 diabetes mellitus with diabetic chronic kidney disease ([place]) 06/13/2018   Wears glasses     Past Surgical History: Past Surgical History:  Procedure Laterality Date   25 GAUGE PARS PLANA VITRECTOMY WITH 20 GAUGE MVR PORT FOR MACULAR HOLE Right 06/14/2014   Procedure: 25 GAUGE PARS PLANA VITRECTOMY WITH 20 GAUGE MVR PORT FOR MACULAR HOLE; serum patch; gas injection; laser treament; C3F8 gas fluid exchange.;  Surgeon: Hayden Pedro, MD;  Location: [facility];  Service: Ophthalmology;  Laterality: Right;   ABDOMINAL HYSTERECTOMY     EYE SURGERY     GANGLION CYST EXCISION Left 02/02/2015   Procedure: EXCISION OF VOLAR GANGLION CYST LEFT WRIST;  Surgeon: Leanora Cover, MD;  Location: [facility];  Service: Orthopedics;  Laterality: Left;   GAS INSERTION Right 07/19/2014   Procedure: INSERTION OF GAS;  Surgeon: Hayden Pedro, MD;  Location: [facility];  Service: Ophthalmology;  Laterality: Right;  C3F8   LASER PHOTO ABLATION Right  07/19/2014   Procedure: LASER PHOTO ABLATION;  Surgeon: Hayden Pedro, MD;  Location: [facility];  Service: Ophthalmology;  Laterality: Right;   PARS PLANA VITRECTOMY W/ REPAIR OF MACULAR HOLE Right 06/14/2014   DR MATTHEWS   SCLERAL BUCKLE Right 07/19/2014   Procedure: SCLERAL BUCKLE;  Surgeon: Hayden Pedro, MD;  Location: [facility] [facility];  Service: Ophthalmology;  Laterality: Right;    Social History: Social History   Tobacco Use   Smoking status: Former    Packs/day: 0.25    Years: 3.00    Pack years: 0.75    Types: Cigarettes    Quit date: 12/09/1993    Years since quitting: 28.1   Smokeless tobacco: Never  Vaping Use   Vaping Use: Never used  Substance Use Topics   Alcohol use: No   Drug use: No   Additional social history:  Please also refer to relevant sections of EMR.  Family History: Family History  Problem Relation Age of Onset   Healthy Mother  Healthy Father    Hypertension Sister     Allergies and Medications: Allergies  Allergen Reactions   Nsaids Other (See Comments)    REACTION: Has chronic renal disease   Benazepril Hcl Other (See Comments)    REACTION: Dizziness   Furosemide Other (See Comments)    REACTION: Hypernatremia   Atenolol Other (See Comments)    REACTION: Headaches   No current facility-administered medications on file prior to encounter.   Current Outpatient Medications on File Prior to Encounter  Medication Sig Dispense Refill   allopurinol (ZYLOPRIM) 100 MG tablet TAKE 1 TABLET BY MOUTH EVERY DAY (Patient not taking: Reported on 02/07/2022) 90 tablet 2   atorvastatin (LIPITOR) 10 MG tablet Take 1 tablet (10 mg total) by mouth at bedtime. (Patient not taking: Reported on 02/07/2022) 90 tablet 3   cloNIDine (CATAPRES) 0.2 MG tablet Take 1 tablet (0.2 mg total) by mouth daily. (Patient not taking: Reported on 02/07/2022) 90 tablet 0   diclofenac Sodium (VOLTAREN) 1 % GEL Apply to right knee 2-3 times daily as needed for pain (Patient not taking:  Reported on 02/07/2022) 50 g 0   donepezil (ARICEPT) 10 MG tablet TAKE 1 TABLET(10 MG) BY MOUTH DAILY (Patient not taking: Reported on 02/07/2022) 30 tablet 2   mupirocin cream (BACTROBAN) 2 % Apply 1 application topically 2 (two) times daily. (Patient not taking: Reported on 02/07/2022) 15 g 0    Objective: BP (!) 159/67    Pulse (!) 56    Temp 98.4 F (36.9 C) (Oral)    Resp 17    Ht 5\' 9"  (1.753 m)    Wt 68 kg    SpO2 100%    BMI 22.15 kg/m  Exam: General: pleasant, NAD Eyes: PERRL, EOMI. Normal conjunctiva  ENTM: oropharynx non erythematous and without lesions  Neck: supple normal ROM Cardiovascular: RRR Respiratory: CTAB normal WOB Gastrointestinal: soft, non distended, non tender  MSK: Normal ROM and strength of upper and lower extremities bilaterally  Derm: warm and dry  Neuro: alert and oriented x 2. CN 2-12 in tact. Normal sensation bilaterally  Psych: mood and affect appropriate, speech non tangential. Thought content normal   Labs and Imaging: CBC BMET  Recent Labs  Lab 02/07/22 1425  WBC 6.2  HGB 6.5*  HCT 22.5*  PLT 366   Recent Labs  Lab 02/07/22 1425  NA 142  K 4.2  CL 114*  CO2 19*  BUN 35*  CREATININE 2.77*  GLUCOSE 89  CALCIUM 10.0     EKG: My own interpretation (not copied from electronic read) NSR    Shary Key, DO 02/07/2022, 8:05 PM PGY-2, [place] Intern pager: [phone], text pages welcome

## 2022-02-07 NOTE — Hospital Course (Addendum)
Megan Stevenson is an 81 year-old female who presented with weakness and dizziness in the setting of anemia, admitted to the [facility] for blood transfusion. Hospital course is outlined below: ? ?Microcytic anemia likely 2/2 CKD ?Seen outpatient at [facility] and found to have decreased hemoglobin, sent to [facility] for transfusion. Hgb on arrival 6.5. Admitted for transfusion and observation. Received 1u pRBC with post-transfusion Hgb 7.1. Pt had morning Hgb of 6.9, received a second unit of pRBCs with repeat Hgb of 8.7  ? ?Hypertension ?BP elevated but stable. Home clonidine 0.2mg  daily, amlodipine 10mg  daily continued. ? ?CKD  ?Cr 2.77 on arrival with baseline 2.4. ? ?Dementia ?Patient alert to name and location on admission, but not year or president. MoCA in July 2022 of 5/30. Home Donepizil 10mg  continued. ? ? ?Follow-up items for PCP ?Repeat CBC  ?Ensure GI follow up for colonoscopy  ?

## 2022-02-07 NOTE — Progress Notes (Signed)
FPTS Brief Progress Note ? ?S: Patient reports feeling much better after receiving the transfusion. No other concerns. Hoping to go home tomorrow. ? ? ?O: ?BP (!) 141/53   Pulse (!) 52   Temp 98.2 ?F (36.8 ?C) (Oral)   Resp 20   Ht 5\' 9"  (1.753 m)   Wt 68 kg   SpO2 100%   BMI 22.15 kg/m?   ?Resting in bed comfortably ? ?A/P: ?Symptomatic anemia ?Labs suggestive of iron deficiency anemia. Symptomatically improving after transfusion. ?- f/u H/H ?- Orders reviewed. Labs for AM ordered, which was adjusted as needed.  ?- If condition changes, plan includes transfuse if Hgb < 7.0.  ? ?Zola Button, MD ?02/07/2022, 10:45 PM ?PGY-2, [place] Night Resident  ?Please page [phone] with questions.  ?  ?

## 2022-02-07 NOTE — ED Provider Triage Note (Signed)
Emergency Medicine Provider Triage Evaluation Note ? ?Megan Stevenson , a 81 y.o. female  was evaluated in triage.  Pt sent from [facility] for increasing generalized weakness over the last 1-2 weeks.  Patient without complaints.  Hx DM II, dementia, CKD stage 4. ? ?Review of Systems  ?Positive: Generalized weakness ?Negative: Fever ? ?Physical Exam  ?BP (!) 147/59 (BP Location: Left Arm)   Pulse 62   Temp 98 ?F (36.7 ?C) (Oral)   Resp 16   SpO2 100%  ?Gen:   Awake, no distress   ?Resp:  Normal effort  ?MSK:   Moves extremities without difficulty  ?Other:  Systolic murmur  ? ?Medical Decision Making  ?Medically screening exam initiated at 2:40 PM.  Appropriate orders placed.  JHANE LORIO was informed that the remainder of the evaluation will be completed by another provider, this initial triage assessment does not replace that evaluation, and the importance of remaining in the ED until their evaluation is complete. ? ?Labs and EKG ordered ?  ?Prince Rome, PA-C ?68/03/21 1446 ? ?

## 2022-02-08 DIAGNOSIS — D649 Anemia, unspecified: Secondary | ICD-10-CM

## 2022-02-08 DIAGNOSIS — D509 Iron deficiency anemia, unspecified: Secondary | ICD-10-CM | POA: Diagnosis not present

## 2022-02-08 LAB — CBC
HCT: 23.4 % — ABNORMAL LOW (ref 36.0–46.0)
Hemoglobin: 6.9 g/dL — CL (ref 12.0–15.0)
MCH: 20.2 pg — ABNORMAL LOW (ref 26.0–34.0)
MCHC: 29.5 g/dL — ABNORMAL LOW (ref 30.0–36.0)
MCV: 68.6 fL — ABNORMAL LOW (ref 80.0–100.0)
Platelets: 283 10*3/uL (ref 150–400)
RBC: 3.41 MIL/uL — ABNORMAL LOW (ref 3.87–5.11)
RDW: 24.3 % — ABNORMAL HIGH (ref 11.5–15.5)
WBC: 6 10*3/uL (ref 4.0–10.5)
nRBC: 0 % (ref 0.0–0.2)

## 2022-02-08 LAB — BASIC METABOLIC PANEL
Anion gap: 8 (ref 5–15)
BUN: 35 mg/dL — ABNORMAL HIGH (ref 8–23)
CO2: 20 mmol/L — ABNORMAL LOW (ref 22–32)
Calcium: 9.6 mg/dL (ref 8.9–10.3)
Chloride: 114 mmol/L — ABNORMAL HIGH (ref 98–111)
Creatinine, Ser: 2.53 mg/dL — ABNORMAL HIGH (ref 0.44–1.00)
GFR, Estimated: 19 mL/min — ABNORMAL LOW (ref >60)
Glucose, Bld: 90 mg/dL (ref 70–99)
Potassium: 4.6 mmol/L (ref 3.5–5.1)
Sodium: 142 mmol/L (ref 135–145)

## 2022-02-08 LAB — FOLATE: Folate: 14.6 ng/mL (ref >5.9)

## 2022-02-08 LAB — PREPARE RBC (CROSSMATCH)

## 2022-02-08 LAB — HEMOGLOBIN AND HEMATOCRIT, BLOOD
HCT: 29.1 % — ABNORMAL LOW (ref 36.0–46.0)
Hemoglobin: 8.7 g/dL — ABNORMAL LOW (ref 12.0–15.0)

## 2022-02-08 MED ORDER — AMLODIPINE BESYLATE 10 MG PO TABS: 10.0000 mg | ORAL_TABLET | Freq: Every day | ORAL | 0 refills | Status: DC

## 2022-02-08 MED ORDER — FERROUS SULFATE 325 (65 FE) MG PO TABS: 325.0000 mg | ORAL_TABLET | Freq: Every day | ORAL | 0 refills | Status: DC

## 2022-02-08 MED ORDER — SODIUM CHLORIDE 0.9 % IV SOLN
250.0000 mg | Freq: Once | INTRAVENOUS | Status: AC
  Administered 2022-02-08: 250 mg via INTRAVENOUS
  Filled 2022-02-08: qty 20

## 2022-02-08 MED ORDER — SODIUM CHLORIDE 0.9% IV SOLUTION: Freq: Once | INTRAVENOUS | Status: AC

## 2022-02-08 NOTE — Progress Notes (Signed)
Spoke with sister who is her healthcare power of attorney regarding goals of care, and she does believe that she would want further work-up done for her anemia.  Told her that I would speak to the specialist to see if anything should be done in patient while she is here which she was agreeable to.  Had a discussion with Dr. Benson Norway and he recommended outpatient follow-up with [facility] whom patient has seen in the past since she is stable and not needing anything done acutely.  Called sister back and left a voicemail that we will be discharging patient today. ?

## 2022-02-08 NOTE — ED Notes (Signed)
Dameron MD paged regarding critical HGB 6.9 ?

## 2022-02-08 NOTE — ED Notes (Signed)
Dameron MD made aware regarding pts HR 44. Pt is sinus brady at baseline and is asleep ?

## 2022-02-08 NOTE — ED Notes (Signed)
Confirmed with Dr. Arby Barrette pt is ready for discharge. Pt being picked up by granddaughter.  ?

## 2022-02-08 NOTE — Discharge Instructions (Addendum)
Dear Megan Stevenson, ? ?Thank you for letting us participate in your care. You were hospitalized for low hemoglobin and diagnosed with Anemia. You were treated with 2 blood transfusions and IV iron.  ? ?POST-HOSPITAL & CARE INSTRUCTIONS ?Take the oral iron supplement prescribed to you ?Follow up with your primary care doctor within one week ?Follow up with your GI doctor, Dr. Michail Sermon at [facility] ? ?Take care and be well! ? ?Family Medicine Teaching Service Inpatient Team ?[place]  ?[facility]  ?[address] [place], [place] [address] ?([phone] ? ? ? ? ?

## 2022-02-08 NOTE — ED Notes (Signed)
Pt ambulatory to and from restroom with one person assist; sister at bedside ?

## 2022-02-08 NOTE — Discharge Summary (Signed)
Family Medicine Teaching Service ?Hospital Discharge Summary ? ?Patient name: Megan Stevenson Medical record number: [id number] ?Date of birth: [birth year] Age: 81 y.o. Gender: female ?Date of Admission: 02/07/2022  Date of Discharge: 02/08/22 ?Admitting Physician: Shary Key, DO ? ?Primary Care Provider: [name], MD ?Consultants: None ? ?Indication for Hospitalization: Symptomatic Anemia  ? ?Discharge Diagnoses/Problem List:  ?Principal Problem: ?  Anemia ?  ?Disposition: Home and ultimately SNF in [place] when bed available (already approved)  ? ?Discharge Condition: Stable ? ?Discharge Exam:  ? ?Temp:  [98 ?F (36.7 ?C)-98.4 ?F (36.9 ?C)] 98.4 ?F (36.9 ?C) (03/03 7829) ?Pulse Rate:  [44-62] 46 (03/03 0700) ?Resp:  [11-23] 11 (03/03 0700) ?BP: (103-160)/(53-75) 130/62 (03/03 0700) ?SpO2:  [93 %-100 %] 98 % (03/03 0700) ?Weight:  [68 kg] 68 kg (03/02 1744) ?Physical Exam: ?General: Lying in bed, pleasant, NAD ?Cardiovascular: RRR ?Respiratory: CTAB, normal WOB ?Abdomen: Soft, nontender to palpation, nondistended ?Extremities: no LE edema ? ?Brief Hospital Course:  ? ?Megan Stevenson is an 81 year-old female who presented with weakness and dizziness in the setting of anemia, admitted to the [facility] for blood transfusion. Hospital course is outlined below: ? ?Microcytic anemia likely 2/2 CKD ?Seen outpatient at [facility] and found to have decreased hemoglobin, sent to [facility] for transfusion. Hgb on arrival 6.5. Labs consistent with iron deficiency anemia: Iron of 19, ferritin of 5, MCV 68.6. B12 and folate normal. Admitted for transfusion and observation. Received 1u pRBC with post-transfusion Hgb 7.1. Pt had morning Hgb of 6.9, received a second unit of pRBCs with repeat Hgb of 8.7. GI was contacted and stated since she is stable she can do a repeat colonoscopy outpatient. Will ensure follow up with PCP for repeat CBC next week and ensure follow up with [facility] GI outpatient.   ? ?Hypertension ?BP elevated but stable. Home clonidine 0.2mg  daily, amlodipine 10mg  daily continued. ? ?CKD  ?Cr 2.77 on arrival with baseline 2.4. Cr improved to 2.53 and patient remained stable at time of discharge.  ? ?Dementia ?Patient alert to name and location on admission, but not year or president. MoCA in July 2022 of 5/30. Home Donepizil 10mg  continued. ? ? ?Issues for Follow Up:  ?Repeat CBC  ?Ensure GI follow up for colonoscopy  ? ?Significant Procedures: None ? ?Significant Labs and Imaging:  ?Recent Labs  ?Lab 02/07/22 ?1425 02/07/22 ?2321 02/08/22 ?5621 02/08/22 ?1040  ?WBC 6.2  --  6.0  --   ?HGB 6.5* 7.1* 6.9* 8.7*  ?HCT 22.5* 23.8* 23.4* 29.1*  ?PLT 366  --  283  --   ? ?Recent Labs  ?Lab 02/07/22 ?1425 02/08/22 ?3086  ?NA 142 142  ?K 4.2 4.6  ?CL 114* 114*  ?CO2 19* 20*  ?GLUCOSE 89 90  ?BUN 35* 35*  ?CREATININE 2.77* 2.53*  ?CALCIUM 10.0 9.6  ?ALKPHOS 64  --   ?AST 27  --   ?ALT 44  --   ?ALBUMIN 3.9  --   ? ? ?None new  ? ?Results/Tests Pending at Time of Discharge: none ? ?Discharge Medications:  ?Allergies as of 02/08/2022   ? ?   Reactions  ? Nsaids Other (See Comments)  ? REACTION: Has chronic renal disease  ? Benazepril Hcl Other (See Comments)  ? REACTION: Dizziness  ? Furosemide Other (See Comments)  ? REACTION: Hypernatremia  ? Atenolol Other (See Comments)  ? REACTION: Headaches  ? ?  ? ?  ?Medication List  ?  ? ?  STOP taking these medications   ? ?allopurinol 100 MG tablet ?Commonly known as: ZYLOPRIM ?  ?mupirocin cream 2 % ?Commonly known as: BACTROBAN ?  ? ?  ? ?TAKE these medications   ? ?amLODipine 10 MG tablet ?Commonly known as: NORVASC ?Take 1 tablet (10 mg total) by mouth daily. ?  ?atorvastatin 10 MG tablet ?Commonly known as: LIPITOR ?Take 1 tablet (10 mg total) by mouth at bedtime. ?  ?cloNIDine 0.2 MG tablet ?Commonly known as: Catapres ?Take 1 tablet (0.2 mg total) by mouth daily. ?  ?diclofenac Sodium 1 % Gel ?Commonly known as: Voltaren ?Apply to right knee 2-3 times  daily as needed for pain ?  ?donepezil 10 MG tablet ?Commonly known as: ARICEPT ?TAKE 1 TABLET(10 MG) BY MOUTH DAILY ?  ?ferrous sulfate 325 (65 FE) MG tablet ?Take 1 tablet (325 mg total) by mouth daily. ?  ? ?  ? ? ?Discharge Instructions: Please refer to Patient Instructions section of EMR for full details.  Patient was counseled important signs and symptoms that should prompt return to medical care, changes in medications, dietary instructions, activity restrictions, and follow up appointments.  ? ?Follow-Up Appointments: ? Follow-up Information   ? ? Brimage, Ronnette Juniper, DO. Go on 02/13/2022.   ?Specialty: Family Medicine ?Why: Please arrive at 2:15 PM for a 2:30 PM appt ?Contact information: ?[address] [place] [address] ?[phone] ? ? ?  ?  ? ? Wilford Corner, MD. Call today.   ?Specialty: Gastroenterology ?Why: Make a follow up appointment for consultation for colonoscopy ?Contact information: ?[address] ?[place] [address] ?[phone] ? ? ?  ?  ? ?  ?  ? ?  ? ? ?Shary Key, DO ?02/08/2022, 2:47 PM ?PGY-2, [place]  ?

## 2022-02-08 NOTE — ED Notes (Signed)
ED TO INPATIENT HANDOFF REPORT  ED Nurse Name and Phone #: [phone]  S Name/Age/Gender Megan Stevenson 81 y.o. female Room/Bed: 035C/035C  Code Status   Code Status: Full Code  Home/SNF/Other Home Patient oriented to: self, place, and situation Is this baseline? Yes   Triage Complete: Triage complete  Chief Complaint Anemia [D64.9]  Triage Note Pt sent from [facility] for possible blood transfusion/to have blood work drawn. Pt endorses generalized weakness x 2 days. Denies blood in stool or other sources of bleeding.    Allergies Allergies  Allergen Reactions   Nsaids Other (See Comments)    REACTION: Has chronic renal disease   Benazepril Hcl Other (See Comments)    REACTION: Dizziness   Furosemide Other (See Comments)    REACTION: Hypernatremia   Atenolol Other (See Comments)    REACTION: Headaches    Level of Care/Admitting Diagnosis ED Disposition     ED Disposition  Admit   Condition  --   Comment  Hospital Area: [facility] [[id number]]  Level of Care: Med-Surg [16]  May place patient in observation at [facility] or [facility] if equivalent level of care is available:: Yes  Covid Evaluation: Asymptomatic Screening Protocol (No Symptoms)  Diagnosis: Anemia [725366]  Admitting Physician: Cora Collum [[id number]]  Attending Physician: Carney Living [[id number]]          B Medical/Surgery History Past Medical History:  Diagnosis Date   Anemia    Arthritis    Ataxia 06/13/2018   Bradycardia 06/13/2018   Bradycardia, sinus 06/13/2018   CKD (chronic kidney disease) stage 4, GFR 15-29 ml/min (HCC) 06/13/2018   Cognitive decline    COLONIC POLYPS, HX OF 07/24/2007   Annotation: 6/06 Qualifier: History of  By: Barbaraann Barthel MD, [place]     Diabetes mellitus without complication (HCC)    no medications   DIVERTICULOSIS, SIGMOID COLON 07/24/2007   Qualifier: Diagnosis of  By: Barbaraann Barthel MD, [place]     Dysrhythmia    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    Qualifier: Diagnosis of  By: Barbaraann Barthel MD, [place]     Hyperlipidemia associated with type 2 diabetes mellitus (HCC) 07/24/2007   Qualifier: Diagnosis of  By: Barbaraann Barthel MD, [place]     Hypertension    Hypertension associated with diabetes (HCC) 07/24/2007   Qualifier: Diagnosis of  By: Barbaraann Barthel MD, [place]     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   Brain MRI 06/13/2018: Small deep white matter and basal ganglia lacunar infarcts   Macular hole    OD   Macular hole, 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   Metatarsal fracture, Right foot 08/25/2020   Osteoarthritis of right knee 04/17/2021   PUD 07/24/2007   Annotation: 10/97 Qualifier: Diagnosis of  By: Barbaraann Barthel MD, [place]     Renal disorder    Sees someone [facility]   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   ROTATOR CUFF SYNDROME, 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   Qualifier: Diagnosis of  By: Barbaraann Barthel MD, Benetta Spar     SUBACROMIAL BURSITIS, 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   Qualifier: Diagnosis of  By: Barbaraann Barthel MD, [place]     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   Wears glasses    Past Surgical History:  Procedure Laterality Date   25 GAUGE PARS PLANA VITRECTOMY WITH 20 GAUGE MVR PORT FOR MACULAR HOLE Right 06/14/2014   Procedure: 25 GAUGE PARS PLANA VITRECTOMY WITH 20 GAUGE MVR PORT FOR MACULAR HOLE; serum patch; gas injection; laser treament; C3F8 gas fluid  exchange.;  Surgeon: Sherrie George, MD;  Location[facility] OR;  Service: Ophthalmology;  Laterality: Right;   ABDOMINAL HYSTERECTOMY     EYE SURGERY     GANGLION CYST EXCISION Left 02/02/2015   Procedure: EXCISION OF VOLAR GANGLION CYST LEFT WRIST;  Surgeon: Betha Loa, MD;  Location: [facility];  Service: Orthopedics;  Laterality: Left;   GAS INSERTION Right 07/19/2014   Procedure: INSERTION OF GAS;  Surgeon: Sherrie George, MD;  Location: [facility] OR;  Service: Ophthalmology;  Laterality: Right;  C3F8   LASER PHOTO ABLATION Right 07/19/2014   Procedure:  LASER PHOTO ABLATION;  Surgeon: Sherrie George, MD;  Location: [facility] OR;  Service: Ophthalmology;  Laterality: Right;   PARS PLANA VITRECTOMY W/ REPAIR OF MACULAR HOLE Right 06/14/2014   DR MATTHEWS   SCLERAL BUCKLE Right 07/19/2014   Procedure: SCLERAL BUCKLE;  Surgeon: Sherrie George, MD;  Location: [facility] OR;  Service: Ophthalmology;  Laterality: Right;     A IV Location/Drains/Wounds Patient Lines/Drains/Airways Status     Active Line/Drains/Airways     Name Placement date Placement time Site Days   Peripheral IV 02/07/22 20 G Right Antecubital 02/07/22  1802  Antecubital  1   Incision (Closed) 06/14/14 Eye Right 06/14/14  1226  -- 2796   Incision (Closed) 07/19/14 Eye Right 07/19/14  1516  -- 2761   Incision (Closed) 02/02/15 Hand Left 02/02/15  1000  -- 2563            Intake/Output Last 24 hours  Intake/Output Summary (Last 24 hours) at 02/08/2022 1247 Last data filed at 02/08/2022 1059 Gross per 24 hour  Intake 965.05 ml  Output --  Net 965.05 ml    Labs/Imaging Results for orders placed or performed during the hospital encounter of 02/07/22 (from the past 48 hour(s))  Comprehensive metabolic panel     Status: Abnormal   Collection Time: 02/07/22  2:25 PM  Result Value Ref Range   Sodium 142 135 - 145 mmol/L   Potassium 4.2 3.5 - 5.1 mmol/L   Chloride 114 (H) 98 - 111 mmol/L   CO2 19 (L) 22 - 32 mmol/L   Glucose, Bld 89 70 - 99 mg/dL    Comment: Glucose reference range applies only to samples taken after fasting for at least 8 hours.   BUN 35 (H) 8 - 23 mg/dL   Creatinine, Ser 2.95 (H) 0.44 - 1.00 mg/dL   Calcium 62.1 8.9 - 30.8 mg/dL   Total Protein 6.7 6.5 - 8.1 g/dL   Albumin 3.9 3.5 - 5.0 g/dL   AST 27 15 - 41 U/L   ALT 44 0 - 44 U/L   Alkaline Phosphatase 64 38 - 126 U/L   Total Bilirubin 0.1 (L) 0.3 - 1.2 mg/dL   GFR, Estimated 17 (L) >60 mL/min    Comment: (NOTE) Calculated using the CKD-EPI Creatinine Equation (2021)    Anion gap 9 5 - 15    Comment:  Performed at [facility], [address] [place], [place] [address]  CBC     Status: Abnormal   Collection Time: 02/07/22  2:25 PM  Result Value Ref Range   WBC 6.2 4.0 - 10.5 K/uL   RBC 3.38 (L) 3.87 - 5.11 MIL/uL   Hemoglobin 6.5 (LL) 12.0 - 15.0 g/dL    Comment: REPEATED TO VERIFY Reticulocyte Hemoglobin testing may be clinically indicated, consider ordering this additional test [id number] THIS CRITICAL RESULT HAS VERIFIED AND BEEN CALLED  TO Ivor Reining RN BY DANIELLE LONG ON 03 02 2023 AT 1501, AND HAS BEEN READ BACK.     HCT 22.5 (L) 36.0 - 46.0 %   MCV 66.6 (L) 80.0 - 100.0 fL   MCH 19.2 (L) 26.0 - 34.0 pg   MCHC 28.9 (L) 30.0 - 36.0 g/dL   RDW 11.9 (H) 14.7 - 82.9 %   Platelets 366 150 - 400 K/uL    Comment: REPEATED TO VERIFY   nRBC 0.0 0.0 - 0.2 %    Comment: Performed at [facility], [address] [place], [place] [address]  Type and screen [facility]     Status: None (Preliminary result)   Collection Time: 02/07/22  2:25 PM  Result Value Ref Range   ABO/RH(D) A POS    Antibody Screen NEG    Sample Expiration 02/10/2022,2359    Unit Number [id number]    Blood Component Type RED CELLS,LR    Unit division 00    Status of Unit ISSUED,FINAL    Transfusion Status OK TO TRANSFUSE    Crossmatch Result      Compatible Performed at [facility], [address] [place], [place] [address]    Unit Number [id number]    Blood Component Type RED CELLS,LR    Unit division 00    Status of Unit ISSUED    Transfusion Status OK TO TRANSFUSE    Crossmatch Result Compatible   ABO/Rh     Status: None   Collection Time: 02/07/22  2:52 PM  Result Value Ref Range   ABO/RH(D)      A POS Performed at [facility], [address] [place], [place] [address]   Prepare RBC (crossmatch)     Status: None   Collection Time: 02/07/22  5:42 PM  Result Value Ref Range   Order Confirmation      ORDER PROCESSED BY BLOOD BANK Performed at  [facility], [address], [place], [place] [address]   Vitamin B12     Status: None   Collection Time: 02/07/22  6:45 PM  Result Value Ref Range   Vitamin B-12 262 180 - 914 pg/mL    Comment: (NOTE) This assay is not validated for testing neonatal or myeloproliferative syndrome specimens for Vitamin B12 levels. Performed at [facility], [address] [place], [place] [address]   Folate     Status: None   Collection Time: 02/07/22  6:45 PM  Result Value Ref Range   Folate 14.6 >5.9 ng/mL    Comment: Performed at [facility], [address], [place], [place] [address]  Iron and TIBC     Status: Abnormal   Collection Time: 02/07/22  6:45 PM  Result Value Ref Range   Iron 19 (L) 28 - 170 ug/dL   TIBC 295 188 - 416 ug/dL   Saturation Ratios 5 (L) 10.4 - 31.8 %   UIBC 360 ug/dL    Comment: Performed at [facility], [address] [place], [place] [address]  Ferritin     Status: Abnormal   Collection Time: 02/07/22  6:45 PM  Result Value Ref Range   Ferritin 5 (L) 11 - 307 ng/mL    Comment: Performed at [facility], [address] [place], [place] [address]  Reticulocytes     Status: Abnormal   Collection Time: 02/07/22  6:45 PM  Result Value Ref Range  Retic Ct Pct 1.4 0.4 - 3.1 %   RBC. 4.25 3.87 - 5.11 MIL/uL   Retic Count, Absolute 59.1 19.0 - 186.0 K/uL   Immature Retic Fract 25.0 (H) 2.3 - 15.9 %    Comment: Performed at [facility], [address] [place], [place] [address]  Resp Panel by RT-PCR (Flu A&B, Covid) Nasopharyngeal Swab     Status: None   Collection Time: 02/07/22 10:05 PM   Specimen: Nasopharyngeal Swab; Nasopharyngeal(NP) swabs in vial transport medium  Result Value Ref Range   SARS Coronavirus 2 by RT PCR NEGATIVE NEGATIVE    Comment: (NOTE) SARS-CoV-2 target nucleic acids are NOT DETECTED.  The SARS-CoV-2 RNA is generally detectable in upper respiratory specimens during the acute phase of infection. The  lowest concentration of SARS-CoV-2 viral copies this assay can detect is 138 copies/mL. A negative result does not preclude SARS-Cov-2 infection and should not be used as the sole basis for treatment or other patient management decisions. A negative result may occur with  improper specimen collection/handling, submission of specimen other than nasopharyngeal swab, presence of viral mutation(s) within the areas targeted by this assay, and inadequate number of viral copies(<138 copies/mL). A negative result must be combined with clinical observations, patient history, and epidemiological information. The expected result is Negative.  Fact Sheet for Patients:  BloggerCourse.com  Fact Sheet for Healthcare Providers:  SeriousBroker.it  This test is no t yet approved or cleared by the [place] FDA and  has been authorized for detection and/or diagnosis of SARS-CoV-2 by FDA under an Emergency Use Authorization (EUA). This EUA will remain  in effect (meaning this test can be used) for the duration of the COVID-19 declaration under Section 564(b)(1) of the Act, 21 U.S.C.section 360bbb-3(b)(1), unless the authorization is terminated  or revoked sooner.       Influenza A by PCR NEGATIVE NEGATIVE   Influenza B by PCR NEGATIVE NEGATIVE    Comment: (NOTE) The Xpert Xpress SARS-CoV-2/FLU/RSV plus assay is intended as an aid in the diagnosis of influenza from Nasopharyngeal swab specimens and should not be used as a sole basis for treatment. Nasal washings and aspirates are unacceptable for Xpert Xpress SARS-CoV-2/FLU/RSV testing.  Fact Sheet for Patients: BloggerCourse.com  Fact Sheet for Healthcare Providers: SeriousBroker.it  This test is not yet approved or cleared by the [place] FDA and has been authorized for detection and/or diagnosis of SARS-CoV-2 by FDA under an Emergency  Use Authorization (EUA). This EUA will remain in effect (meaning this test can be used) for the duration of the COVID-19 declaration under Section 564(b)(1) of the Act, 21 U.S.C. section 360bbb-3(b)(1), unless the authorization is terminated or revoked.  Performed at [facility], [address], [place], [place] [address]   Hemoglobin and hematocrit, blood     Status: Abnormal   Collection Time: 02/07/22 11:21 PM  Result Value Ref Range   Hemoglobin 7.1 (L) 12.0 - 15.0 g/dL   HCT 65.7 (L) 84.6 - 96.2 %    Comment: Performed at [facility], [address] [place], [place] [address]  CBC     Status: Abnormal   Collection Time: 02/08/22  3:46 AM  Result Value Ref Range   WBC 6.0 4.0 - 10.5 K/uL   RBC 3.41 (L) 3.87 - 5.11 MIL/uL   Hemoglobin 6.9 (LL) 12.0 - 15.0 g/dL    Comment: REPEATED TO VERIFY Reticulocyte Hemoglobin testing may be clinically indicated, consider ordering this additional test  [id number] THIS CRITICAL RESULT HAS VERIFIED AND BEEN CALLED TO A THOMPSON RN BY CANDACE HAYES ON 03 03 2023 AT 0414, AND HAS BEEN READ BACK.     HCT 23.4 (L) 36.0 - 46.0 %   MCV 68.6 (L) 80.0 - 100.0 fL   MCH 20.2 (L) 26.0 - 34.0 pg   MCHC 29.5 (L) 30.0 - 36.0 g/dL   RDW 47.8 (H) 29.5 - 62.1 %   Platelets 283 150 - 400 K/uL    Comment: REPEATED TO VERIFY   nRBC 0.0 0.0 - 0.2 %    Comment: Performed at [facility], [address], [place], [place] [address]  Basic metabolic panel     Status: Abnormal   Collection Time: 02/08/22  3:46 AM  Result Value Ref Range   Sodium 142 135 - 145 mmol/L   Potassium 4.6 3.5 - 5.1 mmol/L   Chloride 114 (H) 98 - 111 mmol/L   CO2 20 (L) 22 - 32 mmol/L   Glucose, Bld 90 70 - 99 mg/dL    Comment: Glucose reference range applies only to samples taken after fasting for at least 8 hours.   BUN 35 (H) 8 - 23 mg/dL   Creatinine, Ser 7.84 (H) 0.44 - 1.00 mg/dL   Calcium 9.6 8.9 - 69.6 mg/dL   GFR, Estimated 19 (L) >60 mL/min     Comment: (NOTE) Calculated using the CKD-EPI Creatinine Equation (2021)    Anion gap 8 5 - 15    Comment: Performed at [facility], [address] [place], [place] [address]  Prepare RBC (crossmatch)     Status: None   Collection Time: 02/08/22  4:20 AM  Result Value Ref Range   Order Confirmation      ORDER PROCESSED BY BLOOD BANK Performed at [facility], [address] [place], [place] [address]   Hemoglobin and hematocrit, blood     Status: Abnormal   Collection Time: 02/08/22 10:40 AM  Result Value Ref Range   Hemoglobin 8.7 (L) 12.0 - 15.0 g/dL    Comment: REPEATED TO VERIFY POST TRANSFUSION SPECIMEN    HCT 29.1 (L) 36.0 - 46.0 %    Comment: Performed at [facility], [address], [place], [place] [address]   No results found.  Pending Labs Unresulted Labs (From admission, onward)    None       Vitals/Pain Today's Vitals   02/08/22 0930 02/08/22 1035 02/08/22 1106 02/08/22 1200  BP: (!) 125/59 137/62 137/62   Pulse: (!) 54  (!) 51   Resp: 20  13   Temp:   97.7 F (36.5 C)   TempSrc:   Oral   SpO2: 96%     Weight:      Height:      PainSc:    0-No pain    Isolation Precautions No active isolations  Medications Medications  acetaminophen (TYLENOL) tablet 650 mg (has no administration in time range)    Or  acetaminophen (TYLENOL) suppository 650 mg (has no administration in time range)  amLODipine (NORVASC) tablet 10 mg (10 mg Oral Given 02/08/22 1035)  atorvastatin (LIPITOR) tablet 10 mg (10 mg Oral Given 02/07/22 2201)  cloNIDine (CATAPRES) tablet 0.2 mg (0.2 mg Oral Given 02/08/22 1035)  donepezil (ARICEPT) tablet 10 mg (10 mg Oral Given 02/07/22 2201)  0.9 %  sodium chloride infusion (Manually program via Guardrails IV Fluids) (0 mLs Intravenous Stopped 02/07/22 2159)  0.9 %  sodium chloride infusion (Manually program via Guardrails IV Fluids) (0 mLs Intravenous Stopped 02/08/22 0741)  ferric gluconate (FERRLECIT) 250 mg in sodium  chloride 0.9 % 250 mL IVPB (0 mg Intravenous Stopped 02/08/22 1059)    Mobility walks with person assist Low fall risk   Focused Assessments   R Recommendations: See Admitting Provider Note  Report given to:   Additional Notes:

## 2022-02-08 NOTE — Progress Notes (Signed)
Family Medicine Teaching Service ?Daily Progress Note ?Intern Pager: [phone] ? ?Patient name: Megan Stevenson Medical record number: [id number] ?Date of birth: [birth year] Age: 81 y.o. Gender: female ? ?Primary Care Provider: [name], MD ?Consultants: none ?Code Status: Full ? ?Pt Overview and Major Events to Date:  ?02/07/2022-admitted ? ?Assessment and Plan: ?Patient is an 81 year old female presenting from [facility] with 2-day history of generalized weakness and found to have microcytic anemia.  PMH significant for moderate stage dementia, CKD stage IV ? ?Acute on chronic microcytic anemia likely secondary to chronic kidney disease ?Hemoglobin 6.5 on admission received 1 unit of blood with repeat of 7.1.  Hemoglobin of 6.9 this morning and is being transfused with a second unit of blood.  ?Labs consistent with iron deficiency anemia: Iron of 19, ferritin of 5, MCV 68.6. B12 is normal and folate pending. ?Per chart review, pt had colonoscopy in 2006 with plans for repeat in 10 years. Another note mentioned repeating colonoscopy in 2011 but unsure if this was done.  Pt states she is unsure when she last had a colonoscopy. Will refer to GI out patient if POA feels like pt would want to further explore possible reasons for blood loss. ?-f/u post transfusion H&H ?-IV ferrlecit 250 mg ?-d/c with oral iron ?-f/u with GI out patient  ? ?HTN ?BP today of 130/65.  Patient states she has not been taking her home medications in approximately the past year but is willing to start taking them at discharge. ?-Continue home clonidine 0.2 mg daily ?-Continue home amlodipine 10 mg daily ? ?CKD Stage IV ?Creatinine 2.77>2.53 baseline appears to be around 2.4. ?-A.m. BMP ?-Avoid nephrotoxic agents ? ?Dementia ?[place] of 5/30 on 06/14/2021.  Patient states she has not been taking her home medications over the past year but she is willing to start taking them again at discharge.  ?-Continue donepezil ? ?Goals of care ?Will talk  with family about goals of care and consider palliative consult out patient. I will call pts POA, Welford Roche, patients sister.  ? ? ?FEN/GI: Regular ?PPx: SCDs ?Dispo: Home pending post H&H after blood transfusion this morning. ? ?Subjective:  ?Patient states she is feeling much better after receiving 1 blood transfusion and agrees to receive a second blood transfusion as well as IV iron.  Patient states if possible, she would prefer to go home later today. ? ?Objective: ?Temp:  [98 ?F (36.7 ?C)-98.4 ?F (36.9 ?C)] 98.4 ?F (36.9 ?C) (03/03 1660) ?Pulse Rate:  [44-62] 46 (03/03 0700) ?Resp:  [11-23] 11 (03/03 0700) ?BP: (103-160)/(53-75) 130/62 (03/03 0700) ?SpO2:  [93 %-100 %] 98 % (03/03 0700) ?Weight:  [68 kg] 68 kg (03/02 1744) ?Physical Exam: ?General: Patient lying in bed, pleasant to speak with, NAD ?Cardiovascular: RRR, normal S1/S2 ?Respiratory: CTAB, normal effort ?Abdomen: Soft, nontender to palpation, nondistended, normal bowel sounds ?Extremities: No edema BLEs ? ?Laboratory: ?Recent Labs  ?Lab 02/07/22 ?1425 02/07/22 ?2321 02/08/22 ?6301  ?WBC 6.2  --  6.0  ?HGB 6.5* 7.1* 6.9*  ?HCT 22.5* 23.8* 23.4*  ?PLT 366  --  283  ? ?Recent Labs  ?Lab 02/07/22 ?1425 02/08/22 ?6010  ?NA 142 142  ?K 4.2 4.6  ?CL 114* 114*  ?CO2 19* 20*  ?BUN 35* 35*  ?CREATININE 2.77* 2.53*  ?CALCIUM 10.0 9.6  ?PROT 6.7  --   ?BILITOT 0.1*  --   ?ALKPHOS 64  --   ?ALT 44  --   ?AST 27  --   ?GLUCOSE  89 90  ? ? ? ?Precious Gilding, DO ?02/08/2022, 7:21 AM ?PGY-1, [facility] ?[place] Intern pager: [phone], text pages welcome ? ?

## 2022-02-09 LAB — BPAM RBC
Blood Product Expiration Date: 202303082359
Blood Product Expiration Date: 202303102359
ISSUE DATE / TIME: 202303021854
ISSUE DATE / TIME: 202303030525
Unit Type and Rh: 6200
Unit Type and Rh: 6200

## 2022-02-09 LAB — TYPE AND SCREEN
ABO/RH(D): A POS
Antibody Screen: NEGATIVE
Unit division: 0
Unit division: 0

## 2022-02-12 DIAGNOSIS — F32A Depression, unspecified: Secondary | ICD-10-CM | POA: Diagnosis not present

## 2022-02-12 DIAGNOSIS — N189 Chronic kidney disease, unspecified: Secondary | ICD-10-CM | POA: Diagnosis not present

## 2022-02-12 DIAGNOSIS — M170 Bilateral primary osteoarthritis of knee: Secondary | ICD-10-CM | POA: Diagnosis not present

## 2022-02-12 DIAGNOSIS — F03B3 Unspecified dementia, moderate, with mood disturbance: Secondary | ICD-10-CM | POA: Diagnosis not present

## 2022-02-12 DIAGNOSIS — E1122 Type 2 diabetes mellitus with diabetic chronic kidney disease: Secondary | ICD-10-CM | POA: Diagnosis not present

## 2022-02-12 DIAGNOSIS — I129 Hypertensive chronic kidney disease with stage 1 through stage 4 chronic kidney disease, or unspecified chronic kidney disease: Secondary | ICD-10-CM | POA: Diagnosis not present

## 2022-02-13 ENCOUNTER — Ambulatory Visit (INDEPENDENT_AMBULATORY_CARE_PROVIDER_SITE_OTHER): Payer: Medicare Other | Admitting: Family Medicine

## 2022-02-13 ENCOUNTER — Encounter: Payer: Self-pay | Admitting: Family Medicine

## 2022-02-13 ENCOUNTER — Other Ambulatory Visit: Payer: Self-pay

## 2022-02-13 VITALS — BP 135/67 | HR 66 | Ht 69.0 in | Wt 145.8 lb

## 2022-02-13 DIAGNOSIS — D631 Anemia in chronic kidney disease: Secondary | ICD-10-CM | POA: Diagnosis not present

## 2022-02-13 DIAGNOSIS — N189 Chronic kidney disease, unspecified: Secondary | ICD-10-CM

## 2022-02-13 DIAGNOSIS — Z09 Encounter for follow-up examination after completed treatment for conditions other than malignant neoplasm: Secondary | ICD-10-CM | POA: Diagnosis not present

## 2022-02-13 NOTE — Progress Notes (Signed)
? ?  SUBJECTIVE:  ? ?CHIEF COMPLAINT / HPI:  ? ?Chief Complaint  ?Patient presents with  ? hosp f/u  ? ? ? ?Megan Stevenson is a 81 y.o. female here for hospital follow up.   ? ?Pt reports patient admitted for symptomatic anemia found to have a hemoglobin in the ED of 6.5.  She received blood transfusions.  Denies hematuria, hematochezia, melena.  She has an upcoming appointment with [facility]. ? ?Here with her family member who reports that she does not need anything at home.  The therapist have been coming out to the house.  Does not need any DME at home.  She does have some questions about whether she should be receiving daily aide.  Goal is for her and her husband to move to a facility in [place]. ? ?Has knee pain.  Has not been using her Voltaren gel as she reports she does not have anymore.  Family member reports she has some at her bedside. ? ?PERTINENT  PMH / PSH: reviewed and updated as appropriate  ? ?OBJECTIVE:  ? ?BP 135/67   Pulse 66   Ht '5\' 9"'$  (1.753 m)   Wt 145 lb 12.8 oz (66.1 kg)   SpO2 100%   BMI 21.53 kg/m?   ? ?GEN: pleasant well appearing elderly female, in no acute distress  ?CV: regular rate and rhythm ?RESP: no increased work of breathing, clear to ascultation bilaterally ?MSK: no edema, or calf tenderness ?SKIN: warm, dry, well-perfused ? ? ? ?ASSESSMENT/PLAN:  ? ? ?Hospital discharge follow-up ?Patient is a 81 year old female with known dementia who was admitted to the hospital for symptomatic anemia.  She received 2 units of PRBCs.  She is to follow-up outpatient for colonoscopy.  She does not need any DME.  There are questions about what assistance she should be receiving.  Apparently, she was supposed to have her shower changed for better assess ability.  I will reach out to her case manager Mrs. Lazaro Arms for assistance. ? ?Symptomatic anemia: Hemoglobin on discharge 8.7.  Repeat CBC today.  She has follow up scheduled with [facility] gastroenterology regarding a  colonoscopy. ? ?Hypertension ?Chronic and stable.  Per discharge summary patient had some elevated blood pressures in the hospital.  BP at goal today.  Continue home clonidine 0.2 mg, amlodipine 10 mg daily ? ? ?Lyndee Hensen, DO ?PGY-3, [facility]   ?02/13/2022  ? ? ? ? ? ? ? ? ?

## 2022-02-13 NOTE — Patient Instructions (Signed)
It was great seeing you today! ? ?Visit Remembers: ?- Stop by the pharmacy to pick up your prescriptions  ?- Continue to work on your healthy eating habits and incorporating exercise into your daily life.  ?- Your goal is to have an BP < 140/80 ?- Medicine Changes: None  ? ?Regarding lab work today:  ?Due to recent changes in healthcare laws, you may see the results of your imaging and laboratory studies on MyChart before your provider has had a chance to review them.  I understand that in some cases there may be results that are confusing or concerning to you. Not all laboratory results come back in the same time frame and you may be waiting for multiple results in order to interpret others.  Please give [place] 72 hours in order for your provider to thoroughly review all the results before contacting the office for clarification of your results. If everything is normal, you will get a letter in the mail or a message in My Chart. Please give [place] a call if you do not hear from [place] after 2 weeks. ? ?Please bring all of your medications with you to each visit.  ?  ?If you haven't already, sign up for My Chart to have easy access to your labs results, and communication with your primary care physician. ? ?Feel free to call with any questions or concerns at any time, at [phone]. ?  ?Take care,  ?Dr. Susa Simmonds ?[place]  ?

## 2022-02-14 ENCOUNTER — Encounter: Payer: Self-pay | Admitting: Family Medicine

## 2022-02-14 LAB — CBC
Hematocrit: 31.2 % — ABNORMAL LOW (ref 34.0–46.6)
Hemoglobin: 9.6 g/dL — ABNORMAL LOW (ref 11.1–15.9)
MCH: 21.7 pg — ABNORMAL LOW (ref 26.6–33.0)
MCHC: 30.8 g/dL — ABNORMAL LOW (ref 31.5–35.7)
MCV: 71 fL — ABNORMAL LOW (ref 79–97)
Platelets: 319 10*3/uL (ref 150–450)
RBC: 4.42 x10E6/uL (ref 3.77–5.28)
RDW: 26.1 % — ABNORMAL HIGH (ref 11.7–15.4)
WBC: 7.9 10*3/uL (ref 3.4–10.8)

## 2022-02-18 DIAGNOSIS — M170 Bilateral primary osteoarthritis of knee: Secondary | ICD-10-CM | POA: Diagnosis not present

## 2022-02-18 DIAGNOSIS — N189 Chronic kidney disease, unspecified: Secondary | ICD-10-CM | POA: Diagnosis not present

## 2022-02-18 DIAGNOSIS — E1122 Type 2 diabetes mellitus with diabetic chronic kidney disease: Secondary | ICD-10-CM | POA: Diagnosis not present

## 2022-02-18 DIAGNOSIS — F32A Depression, unspecified: Secondary | ICD-10-CM | POA: Diagnosis not present

## 2022-02-18 DIAGNOSIS — I129 Hypertensive chronic kidney disease with stage 1 through stage 4 chronic kidney disease, or unspecified chronic kidney disease: Secondary | ICD-10-CM | POA: Diagnosis not present

## 2022-02-18 DIAGNOSIS — F03B3 Unspecified dementia, moderate, with mood disturbance: Secondary | ICD-10-CM | POA: Diagnosis not present

## 2022-02-20 ENCOUNTER — Telehealth: Payer: Medicare Other

## 2022-02-20 DIAGNOSIS — E1122 Type 2 diabetes mellitus with diabetic chronic kidney disease: Secondary | ICD-10-CM | POA: Diagnosis not present

## 2022-02-20 DIAGNOSIS — F32A Depression, unspecified: Secondary | ICD-10-CM | POA: Diagnosis not present

## 2022-02-20 DIAGNOSIS — I129 Hypertensive chronic kidney disease with stage 1 through stage 4 chronic kidney disease, or unspecified chronic kidney disease: Secondary | ICD-10-CM | POA: Diagnosis not present

## 2022-02-20 DIAGNOSIS — F03B3 Unspecified dementia, moderate, with mood disturbance: Secondary | ICD-10-CM | POA: Diagnosis not present

## 2022-02-20 DIAGNOSIS — N189 Chronic kidney disease, unspecified: Secondary | ICD-10-CM | POA: Diagnosis not present

## 2022-02-20 DIAGNOSIS — M170 Bilateral primary osteoarthritis of knee: Secondary | ICD-10-CM | POA: Diagnosis not present

## 2022-02-24 DIAGNOSIS — E785 Hyperlipidemia, unspecified: Secondary | ICD-10-CM | POA: Diagnosis not present

## 2022-02-24 DIAGNOSIS — I129 Hypertensive chronic kidney disease with stage 1 through stage 4 chronic kidney disease, or unspecified chronic kidney disease: Secondary | ICD-10-CM | POA: Diagnosis not present

## 2022-02-24 DIAGNOSIS — F03B3 Unspecified dementia, moderate, with mood disturbance: Secondary | ICD-10-CM | POA: Diagnosis not present

## 2022-02-24 DIAGNOSIS — N189 Chronic kidney disease, unspecified: Secondary | ICD-10-CM | POA: Diagnosis not present

## 2022-02-24 DIAGNOSIS — F32A Depression, unspecified: Secondary | ICD-10-CM | POA: Diagnosis not present

## 2022-02-24 DIAGNOSIS — E1122 Type 2 diabetes mellitus with diabetic chronic kidney disease: Secondary | ICD-10-CM | POA: Diagnosis not present

## 2022-02-24 DIAGNOSIS — M170 Bilateral primary osteoarthritis of knee: Secondary | ICD-10-CM | POA: Diagnosis not present

## 2022-02-25 DIAGNOSIS — F03B3 Unspecified dementia, moderate, with mood disturbance: Secondary | ICD-10-CM | POA: Diagnosis not present

## 2022-02-25 DIAGNOSIS — E1122 Type 2 diabetes mellitus with diabetic chronic kidney disease: Secondary | ICD-10-CM | POA: Diagnosis not present

## 2022-02-25 DIAGNOSIS — F32A Depression, unspecified: Secondary | ICD-10-CM | POA: Diagnosis not present

## 2022-02-25 DIAGNOSIS — N189 Chronic kidney disease, unspecified: Secondary | ICD-10-CM | POA: Diagnosis not present

## 2022-02-25 DIAGNOSIS — M170 Bilateral primary osteoarthritis of knee: Secondary | ICD-10-CM | POA: Diagnosis not present

## 2022-02-25 DIAGNOSIS — I129 Hypertensive chronic kidney disease with stage 1 through stage 4 chronic kidney disease, or unspecified chronic kidney disease: Secondary | ICD-10-CM | POA: Diagnosis not present

## 2022-02-26 DIAGNOSIS — I129 Hypertensive chronic kidney disease with stage 1 through stage 4 chronic kidney disease, or unspecified chronic kidney disease: Secondary | ICD-10-CM | POA: Diagnosis not present

## 2022-02-26 DIAGNOSIS — E1122 Type 2 diabetes mellitus with diabetic chronic kidney disease: Secondary | ICD-10-CM | POA: Diagnosis not present

## 2022-02-26 DIAGNOSIS — F03B3 Unspecified dementia, moderate, with mood disturbance: Secondary | ICD-10-CM | POA: Diagnosis not present

## 2022-02-26 DIAGNOSIS — M170 Bilateral primary osteoarthritis of knee: Secondary | ICD-10-CM | POA: Diagnosis not present

## 2022-02-26 DIAGNOSIS — N189 Chronic kidney disease, unspecified: Secondary | ICD-10-CM | POA: Diagnosis not present

## 2022-02-26 DIAGNOSIS — F32A Depression, unspecified: Secondary | ICD-10-CM | POA: Diagnosis not present

## 2022-02-28 DIAGNOSIS — Z20822 Contact with and (suspected) exposure to covid-19: Secondary | ICD-10-CM | POA: Diagnosis not present

## 2022-03-04 DIAGNOSIS — I129 Hypertensive chronic kidney disease with stage 1 through stage 4 chronic kidney disease, or unspecified chronic kidney disease: Secondary | ICD-10-CM | POA: Diagnosis not present

## 2022-03-04 DIAGNOSIS — M170 Bilateral primary osteoarthritis of knee: Secondary | ICD-10-CM | POA: Diagnosis not present

## 2022-03-04 DIAGNOSIS — E1122 Type 2 diabetes mellitus with diabetic chronic kidney disease: Secondary | ICD-10-CM | POA: Diagnosis not present

## 2022-03-04 DIAGNOSIS — F32A Depression, unspecified: Secondary | ICD-10-CM | POA: Diagnosis not present

## 2022-03-04 DIAGNOSIS — N189 Chronic kidney disease, unspecified: Secondary | ICD-10-CM | POA: Diagnosis not present

## 2022-03-04 DIAGNOSIS — F03B3 Unspecified dementia, moderate, with mood disturbance: Secondary | ICD-10-CM | POA: Diagnosis not present

## 2022-03-06 DIAGNOSIS — F32A Depression, unspecified: Secondary | ICD-10-CM | POA: Diagnosis not present

## 2022-03-06 DIAGNOSIS — I129 Hypertensive chronic kidney disease with stage 1 through stage 4 chronic kidney disease, or unspecified chronic kidney disease: Secondary | ICD-10-CM | POA: Diagnosis not present

## 2022-03-06 DIAGNOSIS — E1122 Type 2 diabetes mellitus with diabetic chronic kidney disease: Secondary | ICD-10-CM | POA: Diagnosis not present

## 2022-03-06 DIAGNOSIS — F03B3 Unspecified dementia, moderate, with mood disturbance: Secondary | ICD-10-CM | POA: Diagnosis not present

## 2022-03-06 DIAGNOSIS — M170 Bilateral primary osteoarthritis of knee: Secondary | ICD-10-CM | POA: Diagnosis not present

## 2022-03-06 DIAGNOSIS — N189 Chronic kidney disease, unspecified: Secondary | ICD-10-CM | POA: Diagnosis not present

## 2022-03-08 ENCOUNTER — Telehealth: Payer: Self-pay | Admitting: Family Medicine

## 2022-03-08 NOTE — Telephone Encounter (Signed)
Clinical info completed on PCS form.  Placed form in Dr. Well's box for completion.    When form is completed, please route note to "RN Team" and place in wall pocket in front office.   Megan Stevenson, CMA  

## 2022-03-08 NOTE — Telephone Encounter (Signed)
Patient's sister dropped off form to be completed for assistance for patient in home sent paper work back to doctor today.  She was last seen at [facility] on 02/13/22.  Any questions call patient.  Paperwork was put in white folder today. By GG ?

## 2022-03-12 DIAGNOSIS — N189 Chronic kidney disease, unspecified: Secondary | ICD-10-CM | POA: Diagnosis not present

## 2022-03-12 DIAGNOSIS — F32A Depression, unspecified: Secondary | ICD-10-CM | POA: Diagnosis not present

## 2022-03-12 DIAGNOSIS — F03B3 Unspecified dementia, moderate, with mood disturbance: Secondary | ICD-10-CM | POA: Diagnosis not present

## 2022-03-12 DIAGNOSIS — E1122 Type 2 diabetes mellitus with diabetic chronic kidney disease: Secondary | ICD-10-CM | POA: Diagnosis not present

## 2022-03-12 DIAGNOSIS — M170 Bilateral primary osteoarthritis of knee: Secondary | ICD-10-CM | POA: Diagnosis not present

## 2022-03-12 DIAGNOSIS — I129 Hypertensive chronic kidney disease with stage 1 through stage 4 chronic kidney disease, or unspecified chronic kidney disease: Secondary | ICD-10-CM | POA: Diagnosis not present

## 2022-03-12 NOTE — Telephone Encounter (Signed)
PCS form completed and placed in RN bin in front office. ? ?[name], MD ?

## 2022-03-14 NOTE — Telephone Encounter (Signed)
Sister contacted and advised of form ready for pick.  ? ?Copy has been made for batch scanning.  ?

## 2022-03-19 ENCOUNTER — Telehealth: Payer: Self-pay

## 2022-03-19 ENCOUNTER — Other Ambulatory Visit: Payer: Self-pay | Admitting: Family Medicine

## 2022-03-19 DIAGNOSIS — I129 Hypertensive chronic kidney disease with stage 1 through stage 4 chronic kidney disease, or unspecified chronic kidney disease: Secondary | ICD-10-CM | POA: Diagnosis not present

## 2022-03-19 DIAGNOSIS — N189 Chronic kidney disease, unspecified: Secondary | ICD-10-CM | POA: Diagnosis not present

## 2022-03-19 DIAGNOSIS — R4189 Other symptoms and signs involving cognitive functions and awareness: Secondary | ICD-10-CM

## 2022-03-19 DIAGNOSIS — F03B3 Unspecified dementia, moderate, with mood disturbance: Secondary | ICD-10-CM | POA: Diagnosis not present

## 2022-03-19 DIAGNOSIS — M170 Bilateral primary osteoarthritis of knee: Secondary | ICD-10-CM | POA: Diagnosis not present

## 2022-03-19 DIAGNOSIS — E1122 Type 2 diabetes mellitus with diabetic chronic kidney disease: Secondary | ICD-10-CM | POA: Diagnosis not present

## 2022-03-19 DIAGNOSIS — F32A Depression, unspecified: Secondary | ICD-10-CM | POA: Diagnosis not present

## 2022-03-19 NOTE — Telephone Encounter (Signed)
[facility] OT calls nurse line requesting verbal orders for [facility] OT as follows.  ? ?Recert starting 4/64 for 1 week 5 ? ?Verbal order given per [facility]mc protocol.  ?

## 2022-03-25 DIAGNOSIS — F32A Depression, unspecified: Secondary | ICD-10-CM | POA: Diagnosis not present

## 2022-03-25 DIAGNOSIS — M170 Bilateral primary osteoarthritis of knee: Secondary | ICD-10-CM | POA: Diagnosis not present

## 2022-03-25 DIAGNOSIS — E1122 Type 2 diabetes mellitus with diabetic chronic kidney disease: Secondary | ICD-10-CM | POA: Diagnosis not present

## 2022-03-25 DIAGNOSIS — N189 Chronic kidney disease, unspecified: Secondary | ICD-10-CM | POA: Diagnosis not present

## 2022-03-25 DIAGNOSIS — F03B3 Unspecified dementia, moderate, with mood disturbance: Secondary | ICD-10-CM | POA: Diagnosis not present

## 2022-03-25 DIAGNOSIS — I129 Hypertensive chronic kidney disease with stage 1 through stage 4 chronic kidney disease, or unspecified chronic kidney disease: Secondary | ICD-10-CM | POA: Diagnosis not present

## 2022-03-27 ENCOUNTER — Telehealth: Payer: Self-pay | Admitting: *Deleted

## 2022-03-27 NOTE — Telephone Encounter (Signed)
Received fax back from [place] stating that the date of birth on recent form was incorrect in comparison to the one in their system.  Reviewed form and the DOB on the first page was not legible.  Form redone and placed in Dr. Carolyn Stare box to re-sign.   ? ?Thanks Khrystina Bonnes,CMA ? ?

## 2022-03-29 ENCOUNTER — Other Ambulatory Visit: Payer: Self-pay | Admitting: *Deleted

## 2022-03-29 MED ORDER — AMLODIPINE BESYLATE 10 MG PO TABS: 10.0000 mg | ORAL_TABLET | Freq: Every day | ORAL | 0 refills | Status: DC

## 2022-04-01 DIAGNOSIS — Z20822 Contact with and (suspected) exposure to covid-19: Secondary | ICD-10-CM | POA: Diagnosis not present

## 2022-04-05 ENCOUNTER — Telehealth: Payer: Self-pay | Admitting: Family Medicine

## 2022-04-05 NOTE — Telephone Encounter (Signed)
Patient's sister dropped off home healthcare form to be completed. Last DOS was 02/13/22. Placed in [employer] ?

## 2022-04-05 NOTE — Telephone Encounter (Signed)
Clinical info completed on PCS form.  Placed form in Dr. Well's box for completion.    When form is completed, please route note to "RN Team" and place in wall pocket in front office.   Chelly Dombeck Zimmerman Rumple, CMA  

## 2022-04-08 NOTE — Telephone Encounter (Signed)
Form completed and placed in RN bin in front office. ? ?[name], MD ?

## 2022-04-08 NOTE — Telephone Encounter (Signed)
Form placed up front for pick up and a copy was made for batch scanning.  ? ?Attempted to contact family, however I had to LVM on sisters phone.  ?

## 2022-04-13 DIAGNOSIS — Z20822 Contact with and (suspected) exposure to covid-19: Secondary | ICD-10-CM | POA: Diagnosis not present

## 2022-05-03 ENCOUNTER — Other Ambulatory Visit: Payer: Self-pay | Admitting: Family Medicine

## 2022-06-20 DIAGNOSIS — I129 Hypertensive chronic kidney disease with stage 1 through stage 4 chronic kidney disease, or unspecified chronic kidney disease: Secondary | ICD-10-CM | POA: Diagnosis not present

## 2022-06-20 DIAGNOSIS — N184 Chronic kidney disease, stage 4 (severe): Secondary | ICD-10-CM | POA: Diagnosis not present

## 2022-06-21 ENCOUNTER — Ambulatory Visit: Payer: Medicare Other | Admitting: Family Medicine

## 2022-06-25 ENCOUNTER — Telehealth: Payer: Self-pay | Admitting: Family Medicine

## 2022-06-25 NOTE — Telephone Encounter (Signed)
Clinical info completed on PCS form.  Placed form in Dr. Rock Nephew' box for completion.  Patient has an upcoming appointment on 06-27-22.  When form is completed, please route note to "RN Team" and place in wall pocket in front office.   Megan Stevenson, CMA

## 2022-06-25 NOTE — Telephone Encounter (Signed)
Sister dropped off form at front desk for [employer] .  Verified that patient section of form has been completed.  Last DOS/WCC with PCP was 10/30/21.  Placed form in green team folder to be completed by clinical staff.  Creig Hines

## 2022-06-26 NOTE — Telephone Encounter (Signed)
Form completed and placed in RN triage bin in front office.  Ahmoni Edge, MD  

## 2022-06-27 ENCOUNTER — Encounter: Payer: Self-pay | Admitting: Family Medicine

## 2022-06-27 ENCOUNTER — Ambulatory Visit (INDEPENDENT_AMBULATORY_CARE_PROVIDER_SITE_OTHER): Payer: Medicare Other | Admitting: Family Medicine

## 2022-06-27 VITALS — BP 180/80 | HR 60 | Ht 69.0 in | Wt 148.6 lb

## 2022-06-27 DIAGNOSIS — Z23 Encounter for immunization: Secondary | ICD-10-CM

## 2022-06-27 DIAGNOSIS — I10 Essential (primary) hypertension: Secondary | ICD-10-CM | POA: Diagnosis not present

## 2022-06-27 DIAGNOSIS — F02B0 Dementia in other diseases classified elsewhere, moderate, without behavioral disturbance, psychotic disturbance, mood disturbance, and anxiety: Secondary | ICD-10-CM | POA: Diagnosis not present

## 2022-06-27 DIAGNOSIS — Z9181 History of falling: Secondary | ICD-10-CM | POA: Diagnosis not present

## 2022-06-27 NOTE — Patient Instructions (Addendum)
It was great to see you!  I have placed a new referral for home health physical therapy.  I will re-connect you with our social worker/nurse case manager regarding assisted living or SNF placement and additional resources.  Continue your current medications.  -Dr Rock Nephew

## 2022-06-27 NOTE — Progress Notes (Signed)
    SUBJECTIVE:   CHIEF COMPLAINT / HPI:   HTN Home meds include: amlodipine '10mg'$  and clonidine 0.'2mg'$  daily. Compliance is unclear due to history of dementia, does not have 24/7 care. Sister sets up pill box, tries to come over daily to check in but isn't 100% sure whether she always gets appropriate meds. Saw her nephrologist recently but unable to give any details regarding that visit. Does not check BP at home.  Dementia Patient lives with her husband (who is disabled, blind, needs caregiver himself) and son. Independent in all ADLs although is high fall risk (cruises using furniture). Needs help with all iADLs. Level of care has been an ongoing discussion for the past 1 year. Patient wants to stay at home. Does not want PSC/home health aid but sister tries to encourage it anyway.  PERTINENT  PMH / PSH: CKD stage 4, anemia, osteoarthritis  OBJECTIVE:   BP (!) 180/80   Pulse 60   Ht '5\' 9"'$  (1.753 m)   Wt 148 lb 9.6 oz (67.4 kg)   SpO2 100%   BMI 21.94 kg/m   Gen: NAD, pleasant Neck: supple CV: RRR, normal M3/T5, III/VI systolic murmur Resp: Normal effort, lungs CTAB GI: abdomen soft, non-tender, non-distended Extremities: no pedal edema Skin: warm and dry, no rashes noted Neuro: alert, no obvious focal deficits, oriented to person and place  ASSESSMENT/PLAN:   Hypertension BP significantly elevated today x2. Has been well-controlled at prior visits. Wonder whether sub-optimal medication compliance 2/2 dementia may be contributing. Continue current meds without change for now (amlodipine '10mg'$  daily, clonidine 0.'2mg'$  daily). Ideally would not be on clonidine, but unable to safely taper off as outpatient due to unreliable med management. If additional help at home, would recommend transitioning to hydralazine in place of home clonidine at some point.  Dementia ([place]) Established problem. [place] 5/30 in July 2022. -continue Donepezil -will re-connect with CCM regarding resources,  long term placement options (previously wanted [name] but bed never became available? May need updated FL-2).  At risk for falls Referral to home health PT/OT   Health Maintenance PCV-20 given today.  [name], MD [place]

## 2022-06-27 NOTE — Telephone Encounter (Signed)
Forms given to patient and a copy made for batch scanning.

## 2022-06-28 ENCOUNTER — Encounter: Payer: Self-pay | Admitting: Family Medicine

## 2022-06-28 DIAGNOSIS — R7303 Prediabetes: Secondary | ICD-10-CM | POA: Insufficient documentation

## 2022-06-28 DIAGNOSIS — I10 Essential (primary) hypertension: Secondary | ICD-10-CM | POA: Insufficient documentation

## 2022-06-28 DIAGNOSIS — E785 Hyperlipidemia, unspecified: Secondary | ICD-10-CM | POA: Insufficient documentation

## 2022-06-28 NOTE — Assessment & Plan Note (Addendum)
Established problem. [place] 5/30 in July 2022. -continue Donepezil -will re-connect with CCM regarding resources, long term placement options (previously wanted [name] but bed never became available? May need updated FL-2).

## 2022-06-28 NOTE — Assessment & Plan Note (Signed)
Referral to home health PT/OT

## 2022-06-28 NOTE — Assessment & Plan Note (Signed)
BP significantly elevated today x2. Has been well-controlled at prior visits. Wonder whether sub-optimal medication compliance 2/2 dementia may be contributing. Continue current meds without change for now (amlodipine '10mg'$  daily, clonidine 0.'2mg'$  daily). Ideally would not be on clonidine, but unable to safely taper off as outpatient due to unreliable med management. If additional help at home, would recommend transitioning to hydralazine in place of home clonidine at some point.

## 2022-07-04 DIAGNOSIS — M6281 Muscle weakness (generalized): Secondary | ICD-10-CM | POA: Diagnosis not present

## 2022-07-04 DIAGNOSIS — M7551 Bursitis of right shoulder: Secondary | ICD-10-CM | POA: Diagnosis not present

## 2022-07-04 DIAGNOSIS — E785 Hyperlipidemia, unspecified: Secondary | ICD-10-CM | POA: Diagnosis not present

## 2022-07-04 DIAGNOSIS — Z9181 History of falling: Secondary | ICD-10-CM | POA: Diagnosis not present

## 2022-07-04 DIAGNOSIS — N184 Chronic kidney disease, stage 4 (severe): Secondary | ICD-10-CM | POA: Diagnosis not present

## 2022-07-04 DIAGNOSIS — M6284 Sarcopenia: Secondary | ICD-10-CM | POA: Diagnosis not present

## 2022-07-04 DIAGNOSIS — M109 Gout, unspecified: Secondary | ICD-10-CM | POA: Diagnosis not present

## 2022-07-04 DIAGNOSIS — F03B0 Unspecified dementia, moderate, without behavioral disturbance, psychotic disturbance, mood disturbance, and anxiety: Secondary | ICD-10-CM | POA: Diagnosis not present

## 2022-07-04 DIAGNOSIS — M170 Bilateral primary osteoarthritis of knee: Secondary | ICD-10-CM | POA: Diagnosis not present

## 2022-07-04 DIAGNOSIS — Z87891 Personal history of nicotine dependence: Secondary | ICD-10-CM | POA: Diagnosis not present

## 2022-07-04 DIAGNOSIS — M461 Sacroiliitis, not elsewhere classified: Secondary | ICD-10-CM | POA: Diagnosis not present

## 2022-07-04 DIAGNOSIS — D509 Iron deficiency anemia, unspecified: Secondary | ICD-10-CM | POA: Diagnosis not present

## 2022-07-04 DIAGNOSIS — I129 Hypertensive chronic kidney disease with stage 1 through stage 4 chronic kidney disease, or unspecified chronic kidney disease: Secondary | ICD-10-CM | POA: Diagnosis not present

## 2022-07-04 DIAGNOSIS — E1169 Type 2 diabetes mellitus with other specified complication: Secondary | ICD-10-CM | POA: Diagnosis not present

## 2022-07-04 DIAGNOSIS — E1122 Type 2 diabetes mellitus with diabetic chronic kidney disease: Secondary | ICD-10-CM | POA: Diagnosis not present

## 2022-07-04 DIAGNOSIS — M75121 Complete rotator cuff tear or rupture of right shoulder, not specified as traumatic: Secondary | ICD-10-CM | POA: Diagnosis not present

## 2022-07-09 ENCOUNTER — Telehealth: Payer: Self-pay

## 2022-07-09 NOTE — Telephone Encounter (Signed)
Megan Stevenson PT calls nurse line requesting VO for [facility] PT as follows.   1 week 1  2 week 2  1 week 4   Verbal order given.

## 2022-07-10 ENCOUNTER — Ambulatory Visit: Payer: Self-pay | Admitting: Licensed Clinical Social Worker

## 2022-07-10 NOTE — Patient Outreach (Signed)
  Care Coordination   Follow Up Visit Note   07/10/2022 Name: Megan Stevenson MRN: [id number] DOB: 1941/12/04  Megan Stevenson is a 81 y.o. year old female who sees [name], MD for primary care. I spoke with representative from [place] and verified PCS application was received. SW drove to [facility] clinic on today to receive copy of PCS application from box. SW attempted patient on today to inquire if patient had heard from [place]. SW will attempt another call on 08/03.  What matters to the patients health and wellness today?  PCS     SDOH assessments and interventions completed:  Yes     Care Coordination Interventions Activated:  Yes  Care Coordination Interventions:  Yes, provided   Follow up plan: Follow up call scheduled for 08/03    Encounter Outcome:  Pt. Visit Completed

## 2022-07-11 ENCOUNTER — Ambulatory Visit: Payer: Self-pay | Admitting: Licensed Clinical Social Worker

## 2022-07-11 DIAGNOSIS — I129 Hypertensive chronic kidney disease with stage 1 through stage 4 chronic kidney disease, or unspecified chronic kidney disease: Secondary | ICD-10-CM | POA: Diagnosis not present

## 2022-07-11 DIAGNOSIS — M6281 Muscle weakness (generalized): Secondary | ICD-10-CM | POA: Diagnosis not present

## 2022-07-11 DIAGNOSIS — N184 Chronic kidney disease, stage 4 (severe): Secondary | ICD-10-CM | POA: Diagnosis not present

## 2022-07-11 DIAGNOSIS — D509 Iron deficiency anemia, unspecified: Secondary | ICD-10-CM | POA: Diagnosis not present

## 2022-07-11 DIAGNOSIS — F03B0 Unspecified dementia, moderate, without behavioral disturbance, psychotic disturbance, mood disturbance, and anxiety: Secondary | ICD-10-CM | POA: Diagnosis not present

## 2022-07-11 DIAGNOSIS — E1122 Type 2 diabetes mellitus with diabetic chronic kidney disease: Secondary | ICD-10-CM | POA: Diagnosis not present

## 2022-07-11 NOTE — Patient Instructions (Signed)
Visit Information  Instructions:   Patient was given the following information about care management and care coordination services today, agreed to services, and gave verbal consent: 1.care management/care coordination services include personalized support from designated clinical staff supervised by their physician, including individualized plan of care and coordination with other care providers 2. 24/7 contact phone numbers for assistance for urgent and routine care needs. 3. The patient may stop care management/care coordination services at any time by phone call to the office staff.  Patient verbalizes understanding of instructions and care plan provided today and agrees to view in MyChart. Active MyChart status and patient understanding of how to access instructions and care plan via MyChart confirmed with patient.     No further follow up required: .  Teandra Harlan, BSW , MSW Social Worker [facility] Care Management  [phone]      

## 2022-07-11 NOTE — Patient Outreach (Signed)
  Care Coordination   Follow Up Visit Note   07/11/2022 Name: Megan Stevenson MRN: [id number] DOB: Apr 27, 1941  Megan Stevenson is a 81 y.o. year old female who sees [name], MD for primary care. I spoke with  Lorenso Courier by phone today  What matters to the patients health and wellness today?  SW spoke with Patients sister on today. Patient is working with [facility] care for [facility] services , Patient is working with Vida Rigger, [place] sent to [name]. NO additional needs or interventions needed from SW. SW provided contact information and removed self from careteam. SW advise patient to contact PCP if SW assistance is needed in the future.      SDOH assessments and interventions completed:  Yes     Care Coordination Interventions Activated:  Yes  Care Coordination Interventions:  Yes, provided   Follow up plan: No further intervention required.   Encounter Outcome:  Pt. Visit Completed   Lenor Derrick, MSW  Social Worker [facility] Care Management  [phone]

## 2022-07-12 DIAGNOSIS — E1122 Type 2 diabetes mellitus with diabetic chronic kidney disease: Secondary | ICD-10-CM | POA: Diagnosis not present

## 2022-07-12 DIAGNOSIS — M6281 Muscle weakness (generalized): Secondary | ICD-10-CM | POA: Diagnosis not present

## 2022-07-12 DIAGNOSIS — D509 Iron deficiency anemia, unspecified: Secondary | ICD-10-CM | POA: Diagnosis not present

## 2022-07-12 DIAGNOSIS — I129 Hypertensive chronic kidney disease with stage 1 through stage 4 chronic kidney disease, or unspecified chronic kidney disease: Secondary | ICD-10-CM | POA: Diagnosis not present

## 2022-07-12 DIAGNOSIS — N184 Chronic kidney disease, stage 4 (severe): Secondary | ICD-10-CM | POA: Diagnosis not present

## 2022-07-12 DIAGNOSIS — F03B0 Unspecified dementia, moderate, without behavioral disturbance, psychotic disturbance, mood disturbance, and anxiety: Secondary | ICD-10-CM | POA: Diagnosis not present

## 2022-07-15 DIAGNOSIS — I129 Hypertensive chronic kidney disease with stage 1 through stage 4 chronic kidney disease, or unspecified chronic kidney disease: Secondary | ICD-10-CM | POA: Diagnosis not present

## 2022-07-15 DIAGNOSIS — E1122 Type 2 diabetes mellitus with diabetic chronic kidney disease: Secondary | ICD-10-CM | POA: Diagnosis not present

## 2022-07-15 DIAGNOSIS — F03B0 Unspecified dementia, moderate, without behavioral disturbance, psychotic disturbance, mood disturbance, and anxiety: Secondary | ICD-10-CM | POA: Diagnosis not present

## 2022-07-15 DIAGNOSIS — N184 Chronic kidney disease, stage 4 (severe): Secondary | ICD-10-CM | POA: Diagnosis not present

## 2022-07-15 DIAGNOSIS — M6281 Muscle weakness (generalized): Secondary | ICD-10-CM | POA: Diagnosis not present

## 2022-07-15 DIAGNOSIS — D509 Iron deficiency anemia, unspecified: Secondary | ICD-10-CM | POA: Diagnosis not present

## 2022-07-18 DIAGNOSIS — F03B0 Unspecified dementia, moderate, without behavioral disturbance, psychotic disturbance, mood disturbance, and anxiety: Secondary | ICD-10-CM | POA: Diagnosis not present

## 2022-07-18 DIAGNOSIS — E1122 Type 2 diabetes mellitus with diabetic chronic kidney disease: Secondary | ICD-10-CM | POA: Diagnosis not present

## 2022-07-18 DIAGNOSIS — N184 Chronic kidney disease, stage 4 (severe): Secondary | ICD-10-CM | POA: Diagnosis not present

## 2022-07-18 DIAGNOSIS — M6281 Muscle weakness (generalized): Secondary | ICD-10-CM | POA: Diagnosis not present

## 2022-07-18 DIAGNOSIS — I129 Hypertensive chronic kidney disease with stage 1 through stage 4 chronic kidney disease, or unspecified chronic kidney disease: Secondary | ICD-10-CM | POA: Diagnosis not present

## 2022-07-18 DIAGNOSIS — D509 Iron deficiency anemia, unspecified: Secondary | ICD-10-CM | POA: Diagnosis not present

## 2022-07-19 ENCOUNTER — Telehealth: Payer: Self-pay | Admitting: Family Medicine

## 2022-07-19 DIAGNOSIS — N184 Chronic kidney disease, stage 4 (severe): Secondary | ICD-10-CM | POA: Diagnosis not present

## 2022-07-19 DIAGNOSIS — E1122 Type 2 diabetes mellitus with diabetic chronic kidney disease: Secondary | ICD-10-CM | POA: Diagnosis not present

## 2022-07-19 DIAGNOSIS — I129 Hypertensive chronic kidney disease with stage 1 through stage 4 chronic kidney disease, or unspecified chronic kidney disease: Secondary | ICD-10-CM | POA: Diagnosis not present

## 2022-07-19 DIAGNOSIS — M6281 Muscle weakness (generalized): Secondary | ICD-10-CM | POA: Diagnosis not present

## 2022-07-19 DIAGNOSIS — F03B0 Unspecified dementia, moderate, without behavioral disturbance, psychotic disturbance, mood disturbance, and anxiety: Secondary | ICD-10-CM | POA: Diagnosis not present

## 2022-07-19 DIAGNOSIS — D509 Iron deficiency anemia, unspecified: Secondary | ICD-10-CM | POA: Diagnosis not present

## 2022-07-19 NOTE — Telephone Encounter (Signed)
Sister dropped off form at front desk for [facility].  Verified that patient section of form has been completed.  Last DOS/WCC with PCP was 06/27/22.  Placed form in green team folder to be completed by clinical staff.  Creig Hines

## 2022-07-23 NOTE — Telephone Encounter (Signed)
Clinical info completed on PCS form.  Placed form in Dr. Astrid Drafts box for completion.    When form is completed, please route note to "RN Team" and place in wall pocket in front office.   Esco Joslyn Zimmerman Rumple, CMA

## 2022-07-25 NOTE — Telephone Encounter (Signed)
Form completed and placed in RN triage bin in front office.  Harmonee Tozer, MD  

## 2022-07-25 NOTE — Telephone Encounter (Signed)
[name] contacted and informed of PCS form ready for pick up.   Copy made for batch scanning.   Form placed up front.

## 2022-08-01 ENCOUNTER — Ambulatory Visit: Payer: Medicaid Other | Admitting: Licensed Clinical Social Worker

## 2022-08-01 DIAGNOSIS — N184 Chronic kidney disease, stage 4 (severe): Secondary | ICD-10-CM | POA: Diagnosis not present

## 2022-08-01 DIAGNOSIS — D509 Iron deficiency anemia, unspecified: Secondary | ICD-10-CM | POA: Diagnosis not present

## 2022-08-01 DIAGNOSIS — I129 Hypertensive chronic kidney disease with stage 1 through stage 4 chronic kidney disease, or unspecified chronic kidney disease: Secondary | ICD-10-CM | POA: Diagnosis not present

## 2022-08-01 DIAGNOSIS — E1122 Type 2 diabetes mellitus with diabetic chronic kidney disease: Secondary | ICD-10-CM | POA: Diagnosis not present

## 2022-08-01 DIAGNOSIS — M6281 Muscle weakness (generalized): Secondary | ICD-10-CM | POA: Diagnosis not present

## 2022-08-01 DIAGNOSIS — F03B0 Unspecified dementia, moderate, without behavioral disturbance, psychotic disturbance, mood disturbance, and anxiety: Secondary | ICD-10-CM | POA: Diagnosis not present

## 2022-08-01 NOTE — Patient Outreach (Signed)
  Care Coordination   Follow Up Visit Note   08/01/2022 Name: Megan Stevenson MRN: [id number] DOB: [birth year]  Megan Stevenson is a 81 y.o. year old female who sees [name], MD for primary care. I spoke with  Lorenso Courier by phone today  What matters to the patients health and wellness today?  CAP Services   Goals Addressed               This Visit's Progress     MSW Care Coordination (pt-stated)        Patient Interested in receiving CAP services. Application is pending. SW requested provider to complete final requested document. Document placed in PCP box on today.        SDOH assessments and interventions completed:  Yes     Care Coordination Interventions Activated:  Yes  Care Coordination Interventions:  Yes, provided   Follow up plan: Follow up call scheduled for within the next 90 days.    Encounter Outcome:  Pt. Visit Completed   Lenor Derrick, MSW  Social Worker [facility] Care Management  [phone]

## 2022-08-03 DIAGNOSIS — M109 Gout, unspecified: Secondary | ICD-10-CM | POA: Diagnosis not present

## 2022-08-03 DIAGNOSIS — Z87891 Personal history of nicotine dependence: Secondary | ICD-10-CM | POA: Diagnosis not present

## 2022-08-03 DIAGNOSIS — Z9181 History of falling: Secondary | ICD-10-CM | POA: Diagnosis not present

## 2022-08-03 DIAGNOSIS — N184 Chronic kidney disease, stage 4 (severe): Secondary | ICD-10-CM | POA: Diagnosis not present

## 2022-08-03 DIAGNOSIS — E1169 Type 2 diabetes mellitus with other specified complication: Secondary | ICD-10-CM | POA: Diagnosis not present

## 2022-08-03 DIAGNOSIS — M75121 Complete rotator cuff tear or rupture of right shoulder, not specified as traumatic: Secondary | ICD-10-CM | POA: Diagnosis not present

## 2022-08-03 DIAGNOSIS — E1122 Type 2 diabetes mellitus with diabetic chronic kidney disease: Secondary | ICD-10-CM | POA: Diagnosis not present

## 2022-08-03 DIAGNOSIS — M7551 Bursitis of right shoulder: Secondary | ICD-10-CM | POA: Diagnosis not present

## 2022-08-03 DIAGNOSIS — E785 Hyperlipidemia, unspecified: Secondary | ICD-10-CM | POA: Diagnosis not present

## 2022-08-03 DIAGNOSIS — D509 Iron deficiency anemia, unspecified: Secondary | ICD-10-CM | POA: Diagnosis not present

## 2022-08-03 DIAGNOSIS — M6284 Sarcopenia: Secondary | ICD-10-CM | POA: Diagnosis not present

## 2022-08-03 DIAGNOSIS — M6281 Muscle weakness (generalized): Secondary | ICD-10-CM | POA: Diagnosis not present

## 2022-08-03 DIAGNOSIS — M170 Bilateral primary osteoarthritis of knee: Secondary | ICD-10-CM | POA: Diagnosis not present

## 2022-08-03 DIAGNOSIS — I129 Hypertensive chronic kidney disease with stage 1 through stage 4 chronic kidney disease, or unspecified chronic kidney disease: Secondary | ICD-10-CM | POA: Diagnosis not present

## 2022-08-03 DIAGNOSIS — M461 Sacroiliitis, not elsewhere classified: Secondary | ICD-10-CM | POA: Diagnosis not present

## 2022-08-03 DIAGNOSIS — F03B0 Unspecified dementia, moderate, without behavioral disturbance, psychotic disturbance, mood disturbance, and anxiety: Secondary | ICD-10-CM | POA: Diagnosis not present

## 2022-08-07 ENCOUNTER — Other Ambulatory Visit: Payer: Self-pay | Admitting: Family Medicine

## 2022-08-08 DIAGNOSIS — D509 Iron deficiency anemia, unspecified: Secondary | ICD-10-CM | POA: Diagnosis not present

## 2022-08-08 DIAGNOSIS — I129 Hypertensive chronic kidney disease with stage 1 through stage 4 chronic kidney disease, or unspecified chronic kidney disease: Secondary | ICD-10-CM | POA: Diagnosis not present

## 2022-08-08 DIAGNOSIS — M6281 Muscle weakness (generalized): Secondary | ICD-10-CM | POA: Diagnosis not present

## 2022-08-08 DIAGNOSIS — E1122 Type 2 diabetes mellitus with diabetic chronic kidney disease: Secondary | ICD-10-CM | POA: Diagnosis not present

## 2022-08-08 DIAGNOSIS — F03B0 Unspecified dementia, moderate, without behavioral disturbance, psychotic disturbance, mood disturbance, and anxiety: Secondary | ICD-10-CM | POA: Diagnosis not present

## 2022-08-08 DIAGNOSIS — N184 Chronic kidney disease, stage 4 (severe): Secondary | ICD-10-CM | POA: Diagnosis not present

## 2022-08-12 DIAGNOSIS — E1122 Type 2 diabetes mellitus with diabetic chronic kidney disease: Secondary | ICD-10-CM | POA: Diagnosis not present

## 2022-08-12 DIAGNOSIS — N184 Chronic kidney disease, stage 4 (severe): Secondary | ICD-10-CM | POA: Diagnosis not present

## 2022-08-12 DIAGNOSIS — F03B0 Unspecified dementia, moderate, without behavioral disturbance, psychotic disturbance, mood disturbance, and anxiety: Secondary | ICD-10-CM | POA: Diagnosis not present

## 2022-08-12 DIAGNOSIS — D509 Iron deficiency anemia, unspecified: Secondary | ICD-10-CM | POA: Diagnosis not present

## 2022-08-12 DIAGNOSIS — I129 Hypertensive chronic kidney disease with stage 1 through stage 4 chronic kidney disease, or unspecified chronic kidney disease: Secondary | ICD-10-CM | POA: Diagnosis not present

## 2022-08-12 DIAGNOSIS — M6281 Muscle weakness (generalized): Secondary | ICD-10-CM | POA: Diagnosis not present

## 2022-08-19 ENCOUNTER — Telehealth: Payer: Self-pay | Admitting: *Deleted

## 2022-08-19 NOTE — Telephone Encounter (Signed)
Received voicemail on Friday from granddaughter stating that they submitted the paperwork requesting additional hours of care for patient but were denied.  They were informed by the insurance that provider will need to write a letter of medical necessity for them to send in.  Will forward to MD.  Johnney Ou

## 2022-08-22 ENCOUNTER — Telehealth: Payer: Self-pay | Admitting: Family Medicine

## 2022-08-22 NOTE — Telephone Encounter (Signed)
Patient's sister dropped off home care forms to be completed. Last DOS was 06/27/22. Placed in [employer]

## 2022-08-23 NOTE — Telephone Encounter (Signed)
Granddaughter aware that note is ready for pick up at the front desk.  Quade Ramirez,CMA

## 2022-08-23 NOTE — Telephone Encounter (Signed)
Clinical info completed on [place] form.  Placed form in Dr. Astrid Drafts box for completion.    When form is completed, please route note to "RN Team" and place in wall pocket in front office.   Ottis Stain, CMA

## 2022-08-26 NOTE — Telephone Encounter (Signed)
Form will be completed by tomorrow am (Tuesday, 9/19).

## 2022-08-26 NOTE — Telephone Encounter (Signed)
Patients sister called checking the status of this form. She says it has to be turned in by Wednesday, the 20th. Please advise.

## 2022-08-27 NOTE — Telephone Encounter (Signed)
Patient called to check on status for forms being completed. I informed they were complete and ready to be picked up at the front desk.  Copy made and placed in batch scanning.

## 2022-08-27 NOTE — Telephone Encounter (Signed)
Form completed and placed in RN triage bin in front office.  Ezinne Yogi, MD  

## 2022-10-07 NOTE — Telephone Encounter (Signed)
According to Otila Kluver with the agency, the ICD-10 code provided for dementia is not the most updated number.  She is faxing the form back to our office in order to get this updated.  They are unable to correct it on their end.  Will forward to MD to know why the form is being returned.    Thanks [employer]

## 2022-10-08 NOTE — Telephone Encounter (Signed)
Form updated & placed in "to be faxed" bin in front office.   [name], MD PGY-3, [place]

## 2022-10-17 ENCOUNTER — Ambulatory Visit (INDEPENDENT_AMBULATORY_CARE_PROVIDER_SITE_OTHER): Payer: Medicare Other | Admitting: Family Medicine

## 2022-10-17 VITALS — BP 160/72 | HR 73 | Wt 147.0 lb

## 2022-10-17 DIAGNOSIS — Z23 Encounter for immunization: Secondary | ICD-10-CM

## 2022-10-17 DIAGNOSIS — F02B0 Dementia in other diseases classified elsewhere, moderate, without behavioral disturbance, psychotic disturbance, mood disturbance, and anxiety: Secondary | ICD-10-CM

## 2022-10-17 DIAGNOSIS — R4189 Other symptoms and signs involving cognitive functions and awareness: Secondary | ICD-10-CM | POA: Diagnosis not present

## 2022-10-17 DIAGNOSIS — Z01118 Encounter for examination of ears and hearing with other abnormal findings: Secondary | ICD-10-CM | POA: Diagnosis not present

## 2022-10-17 MED ORDER — DONEPEZIL HCL 10 MG PO TABS: ORAL_TABLET | ORAL | 3 refills | Status: DC

## 2022-10-17 NOTE — Progress Notes (Signed)
   Covid-19 Vaccination Clinic  Name:  AMANADA PHILBRICK    MRN: [id number] DOB: [birth year]  10/17/2022  Ms. Casad was observed post Covid-19 immunization for 15 minutes without incident. She was provided with Vaccine Information Sheet and instruction to access the V-Safe system.   Ms. Rief was instructed to call 911 with any severe reactions post vaccine: Difficulty breathing  Swelling of face and throat  A fast heartbeat  A bad rash all over body  Dizziness and weakness     Talbot Grumbling, RN

## 2022-10-17 NOTE — Progress Notes (Signed)
    SUBJECTIVE:   CHIEF COMPLAINT / HPI:   Request for personal care services Megan Stevenson presents today with her sister and granddaughter to request help with completing the personal care services forms.  Chart review shows a letter was completed by her PCP Dr. Rock Nephew 08/23/2022, however the granddaughter reports the ICD-10 code used for the [facility] services paperwork was incorrect.  We need to redo the application.  I have confirmed that Megan Stevenson's dementia has gotten to the point that she is no longer to do IADLs and needs hands-on assistance with ADLs.  She cannot shower, cook food, toilet, or dress independently.  Her sister relates that she is still able to recognize close family members and caregivers  Health maintenance Patient is a candidate for flu, COVID, and shingles vaccines Also due for Medicare annual wellness, last completed July 2020  Tinnitus, difficulty hearing Patient reports continued tinnitus and decreased hearing. Some ear fullness. Would like me to examine her ears.  PERTINENT  PMH / PSH: Dementia, OA of both knees, prediabetes, HTN, CKD4, right eye retinal detachment, gout, sarcopenia   OBJECTIVE:   BP (!) 160/72   Pulse 73   Wt 147 lb (66.7 kg)   SpO2 99%   BMI 21.71 kg/m    General: Awake, alert, oriented to self and place, no acute distress HEENT: TMs pearly pink bilaterally, no cerumen noted in either external auditory canal  ASSESSMENT/PLAN:   Dementia [facility]) Completed personal care services request form and provided physician letter.  Originals given to granddaughter, copies made and placed in chart.  We will fax our copies to the number we have on the paperwork.  Hearing screen with abnormal findings Patient endorses decreased hearing and tinnitus.  Failed hearing screen July 2022.  No cerumen noted in external auditory canals, TMs unremarkable.  Suspect this is due to ongoing hearing loss.  Need for immunization against influenza Flu vaccine given,  tolerated quite well.  No adverse side effects.  Encounter for immunization COVID-vaccine administered, tolerated without adverse side effects.     Ezequiel Essex, MD [place]

## 2022-10-17 NOTE — Patient Instructions (Addendum)
It was wonderful to see you today. Thank you for allowing me to be a part of your care. Below is a short summary of what we discussed at your visit today:  Home care services It looks like we ordered home health services for you this summer and that he started care with [facility] home services July 04, 2022.  If you need a new orders for this, please have your home health agency fax them to [place] at [phone].  Personal care services I will complete and fax the request for personal care services as required by the state.  If you do not hear from anybody within 2 weeks, please call us back in follow-up.  You can also look into supervised day time care through places like [facility] here in [place].   Vaccines Today you received the annual flu vaccine and Covid booster vaccine. You may experience some residual soreness at the injection site.  Gentle stretches and regular use of that arm will help speed up your recovery.  As the vaccines are giving your immune system a "practice run" against specific infections, you may feel a little under the weather for the next several days.  We recommend rest as needed and hydrating.  Refills I have refilled the donepezil (Aricept) as you requested.   Health Maintenance We like to think about ways to keep you healthy for years to come. Below are some interventions and screenings we can offer to keep you healthy: -Shingles vaccine (available at your pharmacy) -Annual Medicare wellness visit with our nurses here at clinic.  This visit is usually 1 hour long at most and done over the phone.  Please bring all of your medications to every appointment!  If you have any questions or concerns, please do not hesitate to contact us via phone or MyChart message.   Ezequiel Essex, MD

## 2022-10-18 ENCOUNTER — Encounter: Payer: Self-pay | Admitting: Family Medicine

## 2022-10-18 DIAGNOSIS — Z23 Encounter for immunization: Secondary | ICD-10-CM | POA: Insufficient documentation

## 2022-10-18 HISTORY — DX: Encounter for immunization: Z23

## 2022-10-18 NOTE — Assessment & Plan Note (Signed)
COVID-vaccine administered, tolerated without adverse side effects.

## 2022-10-18 NOTE — Assessment & Plan Note (Signed)
Completed personal care services request form and provided physician letter.  Originals given to granddaughter, copies made and placed in chart.  We will fax our copies to the number we have on the paperwork.

## 2022-10-18 NOTE — Assessment & Plan Note (Signed)
Patient endorses decreased hearing and tinnitus.  Failed hearing screen July 2022.  No cerumen noted in external auditory canals, TMs unremarkable.  Suspect this is due to ongoing hearing loss.

## 2022-10-18 NOTE — Assessment & Plan Note (Signed)
Flu vaccine given, tolerated quite well.  No adverse side effects.

## 2022-10-28 ENCOUNTER — Other Ambulatory Visit: Payer: Self-pay | Admitting: Family Medicine

## 2022-11-11 ENCOUNTER — Other Ambulatory Visit: Payer: Self-pay | Admitting: Family Medicine

## 2022-12-12 ENCOUNTER — Telehealth: Payer: Self-pay

## 2022-12-12 NOTE — Telephone Encounter (Signed)
Patient's sister, [name], LVM on nurse line regarding concerns with patient. She states that the patient has been denied PCS/home care services. Patient needs assistance in the home as she is unable to care for herself.   Attempted to return call to [place]. She did not answer, VM left to return call to office.   Will forward to Education officer, museum and PCP to help determine next steps.   Talbot Grumbling, RN

## 2022-12-19 NOTE — Addendum Note (Signed)
Addended by: [name] on: 12/19/2022 07:59 PM   Modules accepted: Orders

## 2022-12-19 NOTE — Telephone Encounter (Signed)
Discussed with Bianca. I was informed patient needs separate referral as she is a [facility] patient. New referral to social work ordered to help with placement and/or additional resources.

## 2022-12-20 ENCOUNTER — Telehealth: Payer: Self-pay | Admitting: *Deleted

## 2022-12-20 NOTE — Progress Notes (Signed)
  Care Coordination   Note   12/20/2022 Name: Megan Stevenson MRN: [id number] DOB: [birth year]  Megan Stevenson is a 82 y.o. year old female who sees [name], MD for primary care. I reached out to Lorenso Courier by phone today to offer care coordination services.  [name] was given information about Care Coordination services today including:   The Care Coordination services include support from the care team which includes your Nurse Coordinator, Clinical Social Worker, or Pharmacist.  The Care Coordination team is here to help remove barriers to the health concerns and goals most important to you. Care Coordination services are voluntary, and the patient may decline or stop services at any time by request to their care team member.   Care Coordination Consent Status: Patient agreed to services and verbal consent obtained.   Follow up plan:  Telephone appointment with care coordination team member scheduled for:  12/25/22  Encounter Outcome:  Pt. Scheduled  [place]  Direct Dial: [phone]

## 2022-12-25 ENCOUNTER — Telehealth: Payer: Self-pay | Admitting: Family Medicine

## 2022-12-25 ENCOUNTER — Ambulatory Visit: Payer: Self-pay

## 2022-12-25 NOTE — Telephone Encounter (Signed)
Sister dropped off form at front desk for [facility].  Verified that patient section of form has been completed.  Last DOS/WCC with PCP was 12/25/22.  Placed form in green team folder to be completed by clinical staff.  Creig Hines

## 2022-12-25 NOTE — Patient Outreach (Signed)
  Care Coordination   Initial Visit Note   12/25/2022 Name: Megan Stevenson MRN: [id number] DOB: Sep 08, 1941  Liliane Shi Veno is a 82 y.o. year old female who sees [name], MD for primary care. I  spoke with patients sister and POA [name] by phone.  What matters to the patients health and wellness today?  My sister needs more help than I can provide    Goals Addressed             This Visit's Progress    Care Coordination Activities       Care Coordination Interventions: Spoke with patients sister and POA [name] who indicated the patient has been denied for [facility] services on three difference occasions as the patient tells the assessor she can performs her own ADL's Discussed the patient needs more assistance in the home than her sister can provide. The patients spouse is placed into long term care and her son is not a reliable caregiver Education provided on the difference in long term placement and [facility] [place] is unsure how to proceed as she knows the patient does not prefer any help but she is unable to provide the assistance needed. [name] indicates the patients grand-daughter is a CNA and has offered to assist some in the home but she is unclear how often that assistance will be available Reviewed patient may start with PACE and then be placed at a later time if it is determined that PACE is not the appropriate level of care Discussed plan for SW to place a referral to [facility] for an assessment as the family is unsure about placement at this time Referral placed to [place] [facility] with [facility]         SDOH assessments and interventions completed:  No     Care Coordination Interventions:  Yes, provided   Follow up plan:  SW will continue to follow    Encounter Outcome:  Pt. Visit Completed   Nadara Eaton, CDP Social Worker, Certified Dementia Practitioner [place] Coordination [phone]

## 2022-12-25 NOTE — Patient Instructions (Signed)
Visit Information  Thank you for taking time to visit with me today. Please don't hesitate to contact me if I can be of assistance to you.   Following are the goals we discussed today:   Goals Addressed             This Visit's Progress    Care Coordination Activities       Care Coordination Interventions: Spoke with patients sister and POA [name] who indicated the patient has been denied for [facility] services on three difference occasions as the patient tells the assessor she can performs her own ADL's Discussed the patient needs more assistance in the home than her sister can provide. The patients spouse is placed into long term care and her son is not a reliable caregiver Education provided on the difference in long term placement and [facility] [place] is unsure how to proceed as she knows the patient does not prefer any help but she is unable to provide the assistance needed. [name] indicates the patients grand-daughter is a CNA and has offered to assist some in the home but she is unclear how often that assistance will be available Reviewed patient may start with PACE and then be placed at a later time if it is determined that PACE is not the appropriate level of care Discussed plan for SW to place a referral to [facility] for an assessment as the family is unsure about placement at this time Referral placed to [place] [facility] with [facility]         Our next appointment is by telephone on 1/24  Please call the care guide team at [phone] if you need to cancel or reschedule your appointment.   If you are experiencing a Mental Health or [place] or need someone to talk to, please call 911  The patient verbalized understanding of instructions, educational materials, and care plan provided today and DECLINED offer to receive copy of patient instructions, educational materials, and care plan.   Telephone follow up appointment with  care management team member scheduled for:1/24  Daneen Schick, Arita Miss, CDP Social Worker, Certified Dementia Practitioner [place] [facility]  Care Coordination [phone]

## 2022-12-26 NOTE — Telephone Encounter (Signed)
Clinical info completed on PCS form.  Placed form in Dr Lala Lund box for completion.    When form is completed, please route note to "RN Team" and place in wall pocket in front office.   Ottis Stain, CMA

## 2022-12-31 NOTE — Telephone Encounter (Signed)
Form completed and placed in RN triage bin in front office.  Sakoya Win, MD  

## 2023-01-01 ENCOUNTER — Ambulatory Visit: Payer: Self-pay

## 2023-01-01 NOTE — Telephone Encounter (Signed)
Form placed up front for pick up.   Copy made for batch scanning.   Attempted to contact numbers listed, however no answer or option for VM.

## 2023-01-01 NOTE — Patient Outreach (Signed)
  Care Coordination   Follow Up Visit Note   01/01/2023 Name: Megan Stevenson MRN: [id number] DOB: 03/03/1941  Megan Stevenson is a 82 y.o. year old female who sees [name], MD for primary care. I  spoke with patients sister and POA Megan Stevenson  What matters to the patients health and wellness today?  My sister needs more help at home    Goals Addressed             This Visit's Progress    Care Coordination Activities       Care Coordination Interventions: Spoke with patients sister and POA Megan Stevenson who indicated she is still unsure what to do with patients care  Megan Stevenson has been in touch with [facility] by phone but has not proceeded as she is unsure when wants to enroll patient in PACE Reviewed best options for the patient include PACE or placement Megan Stevenson indicates she spoke with patients sister in law who helped place the patients spouse in long term care; patients sister in law has someone calling Megan Stevenson on Friday- Megan Stevenson unsure who is calling or why  Discussed plan for SW to contact the patients sister on Monday 1/29 to determine next steps        SDOH assessments and interventions completed:  No     Care Coordination Interventions:  Yes, provided   Follow up plan: Follow up call scheduled for 01/06/23    Encounter Outcome:  Pt. Visit Completed   Daneen Schick, Arita Miss, CDP Social Worker, Certified Dementia Practitioner [place] [facility]  Care Coordination [phone]

## 2023-01-01 NOTE — Patient Instructions (Signed)
Visit Information  Thank you for taking time to visit with me today. Please don't hesitate to contact me if I can be of assistance to you.   Following are the goals we discussed today:   Goals Addressed             This Visit's Progress    Care Coordination Activities       Care Coordination Interventions: Spoke with patients sister and POA [name] who indicated she is still unsure what to do with patients care  [name] has been in touch with [facility] by phone but has not proceeded as she is unsure when wants to enroll patient in PACE Reviewed best options for the patient include PACE or placement [name] indicates she spoke with patients sister in law who helped place the patients spouse in long term care; patients sister in law has someone calling [name] on Friday- [name] unsure who is calling or why  Discussed plan for SW to contact the patients sister on Monday 1/29 to determine next steps        Our next appointment is by telephone on 1/29 at 12:30 pm  Please call the care guide team at [phone] if you need to cancel or reschedule your appointment.   If you are experiencing a Mental Health or [place] or need someone to talk to, please call 911  The patient verbalized understanding of instructions, educational materials, and care plan provided today and DECLINED offer to receive copy of patient instructions, educational materials, and care plan.   Telephone follow up appointment with care management team member scheduled for:01/06/23  Daneen Schick, Arita Miss, CDP Social Worker, Certified Dementia Practitioner [place] [facility]  Care Coordination [phone]

## 2023-01-06 ENCOUNTER — Telehealth: Payer: Self-pay

## 2023-01-06 ENCOUNTER — Ambulatory Visit: Payer: Self-pay

## 2023-01-06 ENCOUNTER — Other Ambulatory Visit: Payer: Self-pay | Admitting: Family Medicine

## 2023-01-06 NOTE — Patient Outreach (Signed)
  Care Coordination   Follow Up Visit Note   01/06/2023 Name: Megan Stevenson MRN: [id number] DOB: 1941/02/14  Megan Stevenson is a 82 y.o. year old female who sees [name], MD for primary care. I  spoke with patients sister and POA Megan Stevenson  What matters to the patients health and wellness today?  I need more help with her care    Goals Addressed             This Visit's Progress    Care Coordination Activities       Care Coordination Interventions: Spoke with patients sister and POA Megan Stevenson who indicated she is having a hard time continuing to manage patients needs. She does not want to force patient into placement or PACE as the patient prefer to stay home  Reviewed the importance of making a decision on next steps in order to care for patient as her memory decline Megan Stevenson in agreement to move forward with the referral to [facility] Outbound call placed to intake specialist Megan [facility] with [facility], voice message left requesting she move forward with referral         SDOH assessments and interventions completed:  No     Care Coordination Interventions:  Yes, provided   Follow up plan: Referral made to [facility]    Encounter Outcome:  Pt. Visit Completed   Daneen Schick, Arita Miss, CDP Social Worker, Certified Dementia Practitioner [facility] Care Management  Care Coordination [phone]

## 2023-01-06 NOTE — Patient Instructions (Signed)
Visit Information  Thank you for taking time to visit with me today. Please don't hesitate to contact me if I can be of assistance to you.   Following are the goals we discussed today:   Goals Addressed             This Visit's Progress    Care Coordination Activities       Care Coordination Interventions: Spoke with patients sister and POA [name] who indicated she is having a hard time continuing to manage patients needs. She does not want to force patient into placement or PACE as the patient prefer to stay home  Reviewed the importance of making a decision on next steps in order to care for patient as her memory decline [name] in agreement to move forward with the referral to [facility] Outbound call placed to intake specialist [place] with [facility], voice message left requesting she move forward with referral         If you are experiencing a Mental Health or [place] or need someone to talk to, please call 911  The patient verbalized understanding of instructions, educational materials, and care plan provided today and DECLINED offer to receive copy of patient instructions, educational materials, and care plan.   Daneen Schick, BSW, CDP Social Worker, Certified Dementia Practitioner [place] [facility]  Care Coordination [phone]

## 2023-01-06 NOTE — Telephone Encounter (Signed)
Left message for patient to call back to schedule Medicare Annual Wellness Visit   Last AWV  07/03/21  If patient calls the office back, please transfer call to me at [phone].

## 2023-01-09 ENCOUNTER — Ambulatory Visit: Payer: Self-pay

## 2023-01-09 NOTE — Patient Instructions (Signed)
Visit Information  Thank you for taking time to visit with me today. Please don't hesitate to contact me if I can be of assistance to you.   Following are the goals we discussed today:   Goals Addressed             This Visit's Progress    Care Coordination Activities       Care Coordination Interventions: Spoke with patients sister and POA [name] who indicated she is scheduled to visit [facility] on Monday to move forward with enrollment into the program  Discussed plan for SW to follow up next week to assess goal progression        If you are experiencing a Mental Health or [place] or need someone to talk to, please call 911  The patient verbalized understanding of instructions, educational materials, and care plan provided today and DECLINED offer to receive copy of patient instructions, educational materials, and care plan.   Daneen Schick, BSW, CDP Social Worker, Certified Dementia Practitioner [place] [facility]  Care Coordination [phone]

## 2023-01-09 NOTE — Patient Outreach (Signed)
  Care Coordination   Follow Up Visit Note   01/09/2023 Name: Megan Stevenson MRN: [id number] DOB: May 12, 1941  Megan Stevenson is a 82 y.o. year old female who sees [name], MD for primary care. I  spoke with patients sister and caregiver Megan Stevenson by phone.  What matters to the patients health and wellness today?  Caregiver resources    Goals Addressed             This Visit's Progress    Care Coordination Activities       Care Coordination Interventions: Spoke with patients sister and POA Megan Stevenson who indicated she is scheduled to visit [facility] on Monday to move forward with enrollment into the program  Discussed plan for SW to follow up next week to assess goal progression        SDOH assessments and interventions completed:  No     Care Coordination Interventions:  Yes, provided   Follow up plan:  SW will follow up over the next week    Encounter Outcome:  Pt. Visit Completed   Nadara Eaton, CDP Social Worker, Certified Dementia Practitioner [facility] Care Management  Care Coordination [phone]

## 2023-01-15 ENCOUNTER — Ambulatory Visit: Payer: Self-pay

## 2023-01-15 ENCOUNTER — Telehealth: Payer: Self-pay

## 2023-01-15 NOTE — Patient Outreach (Signed)
  Care Coordination   Follow Up Visit Note   01/15/2023 Name: JENNFIER ABDULLA MRN: [id number] DOB: Mar 17, 1941  Liliane Shi Short is a 82 y.o. year old female who sees [name], MD for primary care. I  spoke with patients sister and caregiver [name] by phone.  What matters to the patients health and wellness today?  To complete PACE enrollment    Goals Addressed             This Visit's Progress    Care Coordination Activities   On track    Care Coordination Interventions: Spoke with patients sister and POA [name] who indicated she has completed the intake process with [facility] with plans for patient to begin program on March 1 Collaboration with Dr. Rock Nephew advising of plan for patient to move forward with [facility]ACE enrollment Determined patients spouse passed away last week; family in process of planning funeral and will have patient participate Reviewed sister is overwhelmed with care patient requires Reviewed once patients enrollment into the [facility]ACE program is finalized, [name] will have less stress. [name] is able to continue checking on patient and providing support until enrollment is complete SW will follow up after March 1 to confirm PACE enrollment        SDOH assessments and interventions completed:  No     Care Coordination Interventions:  Yes, provided   Interventions Today    Flowsheet Row Most Recent Value  Chronic Disease Discussed/Reviewed   Chronic disease discussed/reviewed during today's visit Hypertension (HTN), Chronic Kidney Disease/End Stage Renal Disease (ESRD), Other  [Dementia]  General Interventions   General Interventions Discussed/Reviewed General Interventions Reviewed, Community Resources        Follow up plan: Follow up call scheduled for 3/4    Encounter Outcome:  Pt. Visit Completed   Daneen Schick, Arita Miss, CDP Social Worker, Certified Dementia Practitioner [place] Coordination [phone]

## 2023-01-15 NOTE — Patient Instructions (Signed)
Visit Information  Thank you for taking time to visit with me today. Please don't hesitate to contact me if I can be of assistance to you.   Following are the goals we discussed today:   Goals Addressed             This Visit's Progress    Care Coordination Activities   On track    Care Coordination Interventions: Spoke with patients sister and POA [name] who indicated she has completed the intake process with [facility] with plans for patient to begin program on March 1 Collaboration with Dr. Rock Nephew advising of plan for patient to move forward with PACE enrollment Determined patients spouse passed away last week; family in process of planning funeral and will have patient participate Reviewed sister is overwhelmed with care patient requires Reviewed once patients enrollment into the PACE program is finalized, [name] will have less stress. [name] is able to continue checking on patient and providing support until enrollment is complete SW will follow up after March 1 to confirm PACE enrollment        If you are experiencing a Mental Health or [place] or need someone to talk to, please call 911  The patient verbalized understanding of instructions, educational materials, and care plan provided today and DECLINED offer to receive copy of patient instructions, educational materials, and care plan.   Daneen Schick, BSW, CDP Social Worker, Certified Dementia Practitioner [place] [facility]  Care Coordination [phone]

## 2023-01-15 NOTE — Patient Outreach (Signed)
  Care Coordination   01/15/2023 Name: CARRYE GOLLER MRN: [id number] DOB: February 06, 1941   Care Coordination Outreach Attempts:  An unsuccessful telephone outreach was attempted for a scheduled appointment today. SW contacted patients sister to follow up on outcome on enrollment with [facility].   Follow Up Plan:  Additional outreach attempts will be made to offer the patient care coordination information and services.   Encounter Outcome:  No Answer   Care Coordination Interventions:  No, not indicated    Daneen Schick, BSW, CDP Social Worker, Certified Dementia Practitioner [place] [facility]  Care Coordination [phone]

## 2023-01-31 ENCOUNTER — Ambulatory Visit: Payer: Medicare Other | Admitting: Family Medicine

## 2023-01-31 DIAGNOSIS — N184 Chronic kidney disease, stage 4 (severe): Secondary | ICD-10-CM | POA: Diagnosis not present

## 2023-01-31 DIAGNOSIS — I129 Hypertensive chronic kidney disease with stage 1 through stage 4 chronic kidney disease, or unspecified chronic kidney disease: Secondary | ICD-10-CM | POA: Diagnosis not present

## 2023-02-02 ENCOUNTER — Other Ambulatory Visit: Payer: Self-pay | Admitting: Family Medicine

## 2023-02-03 ENCOUNTER — Ambulatory Visit (INDEPENDENT_AMBULATORY_CARE_PROVIDER_SITE_OTHER): Payer: Medicare Other | Admitting: Family Medicine

## 2023-02-03 ENCOUNTER — Encounter: Payer: Self-pay | Admitting: Family Medicine

## 2023-02-03 VITALS — BP 154/73 | HR 90 | Ht 69.0 in | Wt 148.8 lb

## 2023-02-03 DIAGNOSIS — K0889 Other specified disorders of teeth and supporting structures: Secondary | ICD-10-CM | POA: Diagnosis not present

## 2023-02-03 DIAGNOSIS — Z1211 Encounter for screening for malignant neoplasm of colon: Secondary | ICD-10-CM | POA: Diagnosis not present

## 2023-02-03 NOTE — Assessment & Plan Note (Signed)
-  slightly elevated BP, likely secondary to tooth pain -continue amlodipine and clonidine -follow up with PCP for BP recheck and adjustment of BP meds as appropriate

## 2023-02-03 NOTE — Assessment & Plan Note (Signed)
-  worsened with chewing, no signs of localized or systemic infection. Concern for tooth or gum pain, instructed on importance of maintaining routine dental care -list of dentists provided, instructed to call to schedule an appointment at their earliest convenience for dental evaluation -salt water gargle  -strict return precautions discussed

## 2023-02-03 NOTE — Progress Notes (Signed)
    SUBJECTIVE:   CHIEF COMPLAINT / HPI:   Patient presents with intermittent tooth and gun pain on her right side that started earlier this morning. Accompanied by her granddaughter. Biting down and touching her right cheek makes the pain worse. Relieving factors include not pressing down on it. Reports associated swelling. Denies redness, fever, chills, cough and congestion. Last went to the dentist years ago, she cannot recall exactly but it is been more than 2-3 years ago.   Denies chest pain, shortness of breath, headaches, vision changes or weakness. Compliant on amlodipine 10 mg daily and also takes clonidine.   OBJECTIVE:   BP (!) 154/73   Pulse 90   Ht 5' 9"$  (1.753 m)   Wt 148 lb 12.8 oz (67.5 kg)   SpO2 100%   BMI 21.97 kg/m   General: Patient well-appearing, in no acute distress. HEENT: very mild facial erythema noted without associated erythema or abscess, very mild tenderness upon deep palpation of right nasolabial fold, no abscess noted within buccal mucosa, no tonsilar exudate or erythema noted, no drainage or bleeding noted CV: RRR, no murmurs or gallops auscultated Resp: CTAB, no wheezing, rales or rhonchi  noted  ASSESSMENT/PLAN:   Tooth pain -worsened with chewing, no signs of localized or systemic infection. Concern for tooth or gum pain, instructed on importance of maintaining routine dental care -list of dentists provided, instructed to call to schedule an appointment at their earliest convenience for dental evaluation -salt water gargle  -strict return precautions discussed   Hypertension -slightly elevated BP, likely secondary to tooth pain -continue amlodipine and clonidine -follow up with PCP for BP recheck and adjustment of BP meds as appropriate   Health maintenance -GI referral placed for colonoscopy per patient and granddaughter preference, she was told to get this in the past given history of anemia.    Donney Dice, [place]

## 2023-02-03 NOTE — Patient Instructions (Signed)
It was great seeing you today!  Today we discussed your tooth pain, it does not look infected but I would like for you to get checked by a dentist to make sure your gums are not infected. Please see below for a dentist and schedule an appointment at your earliest convenience.  Dentists accepting Adult Medicaid patients   [facility]?   [phone]?   [address], [place], [place] [address]    [facility] - only for dentures or partials ?   [phone]?    [facility]?   [phone]?   ?  [facility] ?   [phone]?   [address], [place], [place] [address]     I have placed a referral to the GI specialist for a colonoscopy, they should be in contact with you within the next 1-2 weeks.   If you notice any worsening redness, swelling or fever then please return.   Please follow up at your next scheduled appointment, if anything arises between now and then, please don't hesitate to contact our office.   Thank you for allowing [place] to be a part of your medical care!  Thank you, Dr. Larae Grooms  Also a reminder of our clinic's no-show policy. Please make sure to arrive at least 15 minutes prior to your scheduled appointment time. Please try to cancel before 24 hours if you are not able to make it. If you no-show for 2 appointments then you will be receiving a warning letter. If you no-show after 3 visits, then you may be at risk of being dismissed from our clinic. This is to ensure that everyone is able to be seen in a timely manner. Thank you, we appreciate your assistance with this!

## 2023-02-07 ENCOUNTER — Encounter: Payer: Self-pay | Admitting: Family Medicine

## 2023-02-07 ENCOUNTER — Ambulatory Visit (INDEPENDENT_AMBULATORY_CARE_PROVIDER_SITE_OTHER): Payer: Medicare Other | Admitting: Family Medicine

## 2023-02-07 VITALS — BP 172/62 | HR 88 | Ht 69.0 in | Wt 149.8 lb

## 2023-02-07 DIAGNOSIS — I10 Essential (primary) hypertension: Secondary | ICD-10-CM

## 2023-02-07 DIAGNOSIS — H9313 Tinnitus, bilateral: Secondary | ICD-10-CM

## 2023-02-07 NOTE — Patient Instructions (Signed)
It was great to see you!  If you decide you're interested in hearing aids, let me know.  You can complete the blue advanced directives packet and return to our office at your earliest convenience.  Follow up with your kidney doctor regarding your blood pressure.  Take care, Dr Rock Nephew

## 2023-02-07 NOTE — Progress Notes (Unsigned)
    SUBJECTIVE:   CHIEF COMPLAINT / HPI:   Ringing in Ears -present for at least 1 month -bilateral -sometimes popping sensation in bilateral ears -not painful -does not use q-tips or other foreign bodies -some hearing loss as well which has been gradual over at least a year -no dizziness, fever, rhinorrhea, head trauma, or other complaints  PERTINENT  PMH / PSH: dementia, HTN  OBJECTIVE:   BP (!) 172/62   Pulse 88   Ht '5\' 9"'$  (1.753 m)   Wt 149 lb 12.8 oz (67.9 kg)   SpO2 96%   BMI 22.12 kg/m   Gen: alert, NAD Head: [place]/AT, no sinus tenderness Eyes: normal sclera and conjunctiva, PERRL Ears: external ears, canals, and TMs normal bilaterally Nose: nares patent, no appreciable turbinate hypertrophy Throat: oropharynx unremarkable without tonsillar edema, erythema or exudate CV: RRR, normal S1/S2 Resp: normal effort, lungs CTAB  Skin: no rashes on exposed skin Neuro: grossly intact  ASSESSMENT/PLAN:   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 Normal TMs on physical exam. Failed hearing screen when seen in geri clinic back in 2022. Patient is not interested in hearing aids but will let me know if she changes her mind.   Hypertension Longstanding and not well-controlled. BP elevated x2 today. Her nephrologist is managing and recently added hydralazine. Continue current meds and advised close follow-up with nephrology.   [name], MD [place]

## 2023-02-08 DIAGNOSIS — H9313 Tinnitus, bilateral: Secondary | ICD-10-CM | POA: Insufficient documentation

## 2023-02-08 NOTE — Assessment & Plan Note (Signed)
Likely secondary to presbycusis. Normal TMs on physical exam. Failed hearing screen when seen in geri clinic back in 2022. Patient is not interested in hearing aids but will let me know if she changes her mind.

## 2023-02-08 NOTE — Assessment & Plan Note (Signed)
Longstanding and not well-controlled. BP elevated x2 today. Her nephrologist is managing and recently added hydralazine. Continue current meds and advised close follow-up with nephrology.

## 2023-02-10 ENCOUNTER — Ambulatory Visit: Payer: Self-pay

## 2023-02-10 NOTE — Patient Outreach (Signed)
  Care Coordination   Documentation  Encounter   02/10/2023 Name: Megan Stevenson MRN: [id number] DOB: 1941-07-15  Megan Stevenson is a 82 y.o. year old female who sees [name], MD for primary care. I  received an inbound call from [place] with [facility] APS who indicates patients case has been screened and a Education officer, museum has been assigned.   SDOH assessments and interventions completed:  No     Care Coordination Interventions:  Yes, provided   Interventions Today    Flowsheet Row Most Recent Value  General Interventions   General Interventions Discussed/Reviewed Communication with  Communication with --  [APS]        Follow up plan:  SW will continue to follow.    Encounter Outcome:  Pt. Visit Completed   Daneen Schick, BSW, CDP Social Worker, Certified Dementia Practitioner [place] [facility]  Care Coordination [phone]
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
## 2023-02-11 ENCOUNTER — Telehealth: Payer: Self-pay | Admitting: Family Medicine

## 2023-02-11 NOTE — Telephone Encounter (Signed)
Craig Guess returned Advance Directives form for patient to be placed in doctor's box.

## 2023-02-12 ENCOUNTER — Telehealth: Payer: Self-pay | Admitting: Family Medicine

## 2023-02-12 ENCOUNTER — Ambulatory Visit: Payer: Self-pay

## 2023-02-12 NOTE — Patient Outreach (Signed)
  Care Coordination   Follow Up Visit Note   02/12/2023 Name: GEISHA BOGENSCHUTZ MRN: [id number] DOB: 1941-09-01  Megan Stevenson is a 82 y.o. year old female who sees [name], MD for primary care. I spoke with  Lorenso Courier by phone today.  What matters to the patients health and wellness today?  Long term care planning    Goals Addressed             This Visit's Progress    Care Coordination Activities       Care Coordination Interventions: Spoke with patients sister [name] to advise patient has not been enrolled into the PACE program due to concerns with safety in the home with lack of reliable caregiver  Discussed the patients sister has been in contact with patients grand-daughter to determine if her grand-daughter can move in to live with the patient or if placement is needed Reviewed plan for SW to follow up on progress over the next two weeks        SDOH assessments and interventions completed:  No     Care Coordination Interventions:  Yes, provided   Interventions Today    Flowsheet Row Most Recent Value  Chronic Disease   Chronic disease during today's visit Hypertension (HTN), Other  General Interventions   General Interventions Discussed/Reviewed General Interventions Reviewed, Level of Care        Follow up plan:  SW will continue to follow.    Encounter Outcome:  Pt. Visit Completed   Daneen Schick, BSW, CDP Social Worker, Certified Dementia Practitioner [place] [facility]  Care Coordination [phone]

## 2023-02-12 NOTE — Telephone Encounter (Signed)
Granddaughter Megan Stevenson walked in to inquire about phone call from Dr. Well.  Please call granddaughter

## 2023-02-12 NOTE — Patient Instructions (Signed)
Visit Information  Thank you for taking time to visit with me today. Please don't hesitate to contact me if I can be of assistance to you.   Following are the goals we discussed today:   Goals Addressed             This Visit's Progress    Care Coordination Activities       Care Coordination Interventions: Spoke with patients sister [name] to advise patient has not been enrolled into the PACE program due to concerns with safety in the home with lack of reliable caregiver  Discussed the patients sister has been in contact with patients grand-daughter to determine if her grand-daughter can move in to live with the patient or if placement is needed Reviewed plan for SW to follow up on progress over the next two weeks        If you are experiencing a Mental Health or [place] or need someone to talk to, please call 911  The patient verbalized understanding of instructions, educational materials, and care plan provided today and DECLINED offer to receive copy of patient instructions, educational materials, and care plan.   Daneen Schick, BSW, CDP Social Worker, Certified Dementia Practitioner [place] [facility]  Care Coordination [phone]

## 2023-02-12 NOTE — Telephone Encounter (Signed)
Patient dropped of incomplete Advanced Directives packet with sticky note to call granddaughter Craig Guess).  I attempted to reach granddaughter- no answer. Left voicemail and will try to reach her again later today as time allows.   [name], MD PGY-3, [place]

## 2023-02-13 NOTE — Telephone Encounter (Signed)
Called and spoke with patient's granddaughter.  She was just wondering how to go about signing/notarizing the form.  Patient will come into the office at her convenience to sign the advanced directives packet and have it notarized. It is in my box.   [name], MD PGY-3, [place]

## 2023-02-14 NOTE — Telephone Encounter (Signed)
FYI, Amy ( notary) will not be back until 02/28/23.  If they would like done before then will need to find other means. Christen Bame, CMA

## 2023-02-25 ENCOUNTER — Telehealth: Payer: Self-pay

## 2023-02-25 ENCOUNTER — Ambulatory Visit: Payer: Self-pay

## 2023-02-25 NOTE — Patient Instructions (Signed)
Visit Information  Thank you for taking time to visit with me today. Please don't hesitate to contact me if I can be of assistance to you.   Following are the goals we discussed today:   Goals Addressed             This Visit's Progress    COMPLETED: Care Coordination Activities       Care Coordination Interventions: Spoke with patients sister [name] to assess goal progression  Discussed the patient is now receiving  Medicaid PCS services; [name] unsure of awarded hours but reports patient has a caregiver daily Reviewed patients grand-daughter is also more present in helping with the care of the patient Determined [name] is very happy with current support in the home and feels patient is receiving adequate care at this time Discussed plan for SW to sign off; advised [name] to contact Dr. Rock Nephew if something changes and she feels patient needs placement Collaboration with Dr. Rock Nephew to advise of patients current disposition and plan to remain in the home with PCS and family support        If you are experiencing a Mental Health or [place] or need someone to talk to, please call 911  The patient verbalized understanding of instructions, educational materials, and care plan provided today and DECLINED offer to receive copy of patient instructions, educational materials, and care plan.   No further follow up required: Please contact your primary care provider as needed.  Daneen Schick, BSW, CDP Social Worker, Certified Dementia Practitioner [place] [facility]  Care Coordination [phone]

## 2023-02-25 NOTE — Patient Outreach (Signed)
  Care Coordination   Follow Up Visit Note   02/25/2023 Name: Megan Stevenson MRN: [id number] DOB: 1941/03/26  Megan Stevenson is a 82 y.o. year old female who sees [name], MD for primary care. I  spoke with patients sister and healthcare power of attorney Megan Stevenson by phone.  What matters to the patients health and wellness today?  Reviewed caregiver support and level of care    Goals Addressed             This Visit's Progress    COMPLETED: Care Coordination Activities       Care Coordination Interventions: Spoke with patients sister Megan Stevenson to assess goal progression  Discussed the patient is now receiving  Medicaid PCS services; Megan Stevenson unsure of awarded hours but reports patient has a caregiver daily Reviewed patients grand-daughter is also more present in helping with the care of the patient Determined Megan Stevenson is very happy with current support in the home and feels patient is receiving adequate care at this time Discussed plan for SW to sign off; advised Megan Stevenson to contact Dr. Rock Nephew if something changes and she feels patient needs placement Collaboration with Dr. Rock Nephew to advise of patients current disposition and plan to remain in the home with PCS and family support        SDOH assessments and interventions completed:  No     Care Coordination Interventions:  Yes, provided   Interventions Today    Flowsheet Row Most Recent Value  Chronic Disease   Chronic disease during today's visit Hypertension (HTN), Other  General Interventions   General Interventions Discussed/Reviewed General Interventions Reviewed, Communication with  Communication with PCP/Specialists  Safety Interventions   Safety Discussed/Reviewed Home Safety  [confirmed patient receiving Medicaid PCS]       Follow up plan: No further intervention required.   Encounter Outcome:  Pt. Visit Completed   Daneen Schick, BSW, CDP Social Worker, Certified Dementia Practitioner [place]  Management  Care Coordination [phone]

## 2023-02-25 NOTE — Patient Outreach (Signed)
  Care Coordination   02/25/2023 Name: Megan Stevenson MRN: [id number] DOB: 1941-08-27   Care Coordination Outreach Attempts:  An unsuccessful telephone outreach was attempted today to offer the patient information about available care coordination services as a benefit of their health plan. SW attempted to contact the patients POA and sister [name] to assess goal progression. HIPAA compliant voice message was left requesting a return call.  Follow Up Plan:  Additional outreach attempts will be made to offer the patient care coordination information and services.   Encounter Outcome:  No Answer   Care Coordination Interventions:  No, not indicated    Daneen Schick, BSW, CDP Social Worker, Certified Dementia Practitioner [place] [facility]  Care Coordination [phone]
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
## 2023-03-21 ENCOUNTER — Telehealth: Payer: Self-pay | Admitting: Family Medicine

## 2023-03-21 NOTE — Telephone Encounter (Signed)
Called patient to schedule Medicare Annual Wellness Visit (AWV). Left message for patient to call back and schedule Medicare Annual Wellness Visit (AWV).  Last date of AWV: 07/03/2021   Please schedule an AWVS appointment at any time with [facility] VISIT.  If any questions, please contact me at [phone].    Thank you,  [facility] Support [facility] Direct dial  [phone]

## 2023-03-24 ENCOUNTER — Telehealth: Payer: Self-pay | Admitting: Family Medicine

## 2023-03-24 NOTE — Telephone Encounter (Signed)
Contacted Megan Stevenson to schedule their annual wellness visit. Appointment made for 04/04/2023.  Thank you,  [facility] Support [facility] Direct dial  [phone]

## 2023-03-31 ENCOUNTER — Telehealth: Payer: Self-pay

## 2023-03-31 ENCOUNTER — Ambulatory Visit: Payer: Self-pay

## 2023-03-31 NOTE — Patient Outreach (Signed)
  Care Coordination    03/31/2023 Name: ZOEY BIDWELL MRN: [id number] DOB: [birth year]  Ludger Nutting Bench is a 82 y.o. year old female who sees Maury Dus, MD for primary care. I  spoke with patients sister who is her healthcare power of attorney [name] by phone to follow up on concerns with patient needing assistance in the home. [name] reports the patients grand-daughter sits with the patient Monday through Friday 10-3, patient called [name] this morning very confused as to why she was home alone. [name] went to the patients home to feed her breakfast and stay with her until her grand-daughter arrived. [name] is concerned the patient needs more assistance. Reviewed previous referral to PACE was denied, patient has tried to get PCS recently as well which was denied. Advised options include the family increasing their time spent with the patient or placement. [name] will speak with her family and contact the patients providers office if placement is desired.    SDOH assessments and interventions completed:  No     Care Coordination Interventions:  Yes, provided   Interventions Today    Flowsheet Row Most Recent Value  Chronic Disease   Chronic disease during today's visit Hypertension (HTN), Other  General Interventions   General Interventions Discussed/Reviewed Level of Care, Communication with  Communication with PCP/Specialists  Education Interventions   Education Provided Provided Education  Provided Verbal Education On --  [Level of Care Options]        Follow up plan:  Collaboration with Dr. Anner Crete advising of outcome of call with patients sister. No follow up planned at this time, [facility] is available to assist if placement is desired by the family.     Encounter Outcome:  Pt. Visit Completed   Bevelyn Ngo, BSW, CDP Social Worker, Certified Dementia Practitioner [facility] Care Management  Care Coordination [phone]

## 2023-03-31 NOTE — Telephone Encounter (Signed)
Patient's sister [name]) calls nurse line regarding concerns for patient continuing to live by herself.   She reports that patient is having increased falls and her dementia is worsening. She is concerned for her safety in caring for herself.   Will forward to social work and PCP for further advisement.   [name]: [phone]

## 2023-03-31 NOTE — Patient Instructions (Signed)
Visit Information  Thank you for taking time to visit with me today. Please don't hesitate to contact me if I can be of assistance to you.   Following are the goals we discussed today:  -Contact you primary care providers office if assistance is needed with placement   If you are experiencing a Mental Health or Behavioral Health Crisis or need someone to talk to, please call 911  The patient verbalized understanding of instructions, educational materials, and care plan provided today and DECLINED offer to receive copy of patient instructions, educational materials, and care plan.   Bevelyn Ngo, BSW, CDP Social Worker, Certified Dementia Practitioner [facility] Care Management  Care Coordination [phone]

## 2023-04-04 ENCOUNTER — Ambulatory Visit (INDEPENDENT_AMBULATORY_CARE_PROVIDER_SITE_OTHER): Payer: Medicare Other | Admitting: Family Medicine

## 2023-04-04 ENCOUNTER — Encounter: Payer: Self-pay | Admitting: Family Medicine

## 2023-04-04 VITALS — BP 172/92 | HR 64 | Ht 69.0 in | Wt 149.8 lb

## 2023-04-04 DIAGNOSIS — I10 Essential (primary) hypertension: Secondary | ICD-10-CM

## 2023-04-04 DIAGNOSIS — M170 Bilateral primary osteoarthritis of knee: Secondary | ICD-10-CM

## 2023-04-04 DIAGNOSIS — F02B11 Dementia in other diseases classified elsewhere, moderate, with agitation: Secondary | ICD-10-CM

## 2023-04-04 MED ORDER — ESCITALOPRAM OXALATE 5 MG PO TABS: 5.0000 mg | ORAL_TABLET | Freq: Every day | ORAL | 2 refills | Status: DC

## 2023-04-04 MED ORDER — METHYLPREDNISOLONE ACETATE 40 MG/ML IJ SUSP
40.0000 mg | Freq: Once | INTRAMUSCULAR | Status: AC
  Administered 2023-04-04: 40 mg via INTRAMUSCULAR

## 2023-04-04 NOTE — Patient Instructions (Addendum)
It was nice seeing you today!  Start the Lexapro once a day to help with mood and irritability.  Get your x-rays done here.  You do not need an appointment. [facility] Address: [address] [place], [place], [place] [address] Phone: [phone]   Stay well, Littie Deeds, MD [facility] [phone]  --  Make sure to check out at the front desk before you leave today.  Please arrive at least 15 minutes prior to your scheduled appointments.  If you had blood work today, I will send you a MyChart message or a letter if results are normal. Otherwise, I will give you a call.  If you had a referral placed, they will call you to set up an appointment. Please give [place] a call if you don't hear back in the next 2 weeks.  If you need additional refills before your next appointment, please call your pharmacy first.

## 2023-04-04 NOTE — Progress Notes (Unsigned)
SUBJECTIVE:   CHIEF COMPLAINT / HPI:  Chief Complaint  Patient presents with   Knee Pain   Joint Swelling    Patient accompanied today by sister and granddaughter.  Patient has had pain in the right knee for several years, seems to have gotten worse lately.  He has intermittent swelling to the knee.  She has had difficulty ambulating due to the pain.  She does use a rolling walker. Sometimes taking Tylenol with minimal relief. Denies fever, chills. She has gone through physical therapy in the past but does not think it was helpful. Most recent x-ray of the right knee in 2021 showing degenerative changes.  PERTINENT  PMH / PSH: Osteoarthritis, HTN, dementia, prediabetes, CKD stage IV  Patient Care Team: Maury Dus, MD as PCP - General (Family Medicine)   OBJECTIVE:   BP (!) 172/92   Pulse 64   Ht 5\' 9"  (1.753 m)   Wt 149 lb 12.8 oz (67.9 kg)   SpO2 100%   BMI 22.12 kg/m   Physical Exam Constitutional:      General: She is not in acute distress. Cardiovascular:     Rate and Rhythm: Normal rate and regular rhythm.     Heart sounds: Murmur heard.  Pulmonary:     Effort: Pulmonary effort is normal. No respiratory distress.     Breath sounds: Normal breath sounds.  Musculoskeletal:     Cervical back: Neck supple.     Comments: Notable swelling in bilateral knees worse in the right knee. Right knee with tenderness to palpation along the medial joint line and anterior proximal tibia.  Full ROM.  Full strength with resisted flexion and extension though resisted flexion does elicit pain.  Negative varus/valgus stress.  Negative anterior/posterior drawer.  Positive clunk with McMurray's laterally.  Neurological:     Mental Status: She is alert.         04/04/2023   11:18 AM  Depression screen PHQ 2/9  Decreased Interest 2  Down, Depressed, Hopeless 0  PHQ - 2 Score 2  Altered sleeping 0  Tired, decreased energy 3  Change in appetite 2  Feeling bad or failure  about yourself  0  Trouble concentrating 1  Moving slowly or fidgety/restless 3  Suicidal thoughts 0  PHQ-9 Score 11  Difficult doing work/chores Somewhat difficult    Knee Injection Procedure Note  Pre-operative Diagnosis: right knee osteoarthritis  Post-operative Diagnosis: same  Indications: Symptom relief from osteoarthritis  Anesthesia: Lidocaine 1% without epinephrine without added sodium bicarbonate  Procedure Details   Written consent was obtained for the procedure. The joint was prepped with alcohol swab. A 22 gauge needle was inserted into the anterior aspect of the joint from a medial approach.  4 ml 1% lidocaine and 1 ml of  methylprednisolone 40 mg  was then injected into the joint. The needle was removed and the area cleansed and dressed.  Complications:  None; patient tolerated the procedure well.  {Show previous vital signs (optional):23777}    ASSESSMENT/PLAN:   Osteoarthritis of both knees Chronic, worsening right knee pain likely secondary to underlying osteoarthritis.  Corticosteroid injection performed, noted above.  Offered referral to orthopedics, patient and family declined at this time but will consider in the future.  Hypertension Uncontrolled today, advised to monitor blood pressure at home and follow-up in 1 month.  Per chart review, appears nephrology is primarily managing hypertension.    Return in about 4 weeks (around 05/02/2023) for f/u HTN.  Zola Button, MD [place]

## 2023-04-05 NOTE — Assessment & Plan Note (Addendum)
Uncontrolled today, advised to monitor blood pressure at home and follow-up in 1 month.  Per chart review, appears nephrology is primarily managing hypertension.

## 2023-04-05 NOTE — Assessment & Plan Note (Signed)
Chronic, worsening right knee pain likely secondary to underlying osteoarthritis.  Corticosteroid injection performed, noted above.  Offered referral to orthopedics, patient and family declined at this time but will consider in the future.

## 2023-04-15 ENCOUNTER — Ambulatory Visit (INDEPENDENT_AMBULATORY_CARE_PROVIDER_SITE_OTHER): Payer: Medicare Other | Admitting: Family Medicine

## 2023-04-15 ENCOUNTER — Encounter: Payer: Self-pay | Admitting: Family Medicine

## 2023-04-15 VITALS — BP 143/62 | HR 64 | Ht 69.0 in | Wt 145.6 lb

## 2023-04-15 DIAGNOSIS — I10 Essential (primary) hypertension: Secondary | ICD-10-CM | POA: Diagnosis not present

## 2023-04-15 DIAGNOSIS — F02B18 Dementia in other diseases classified elsewhere, moderate, with other behavioral disturbance: Secondary | ICD-10-CM

## 2023-04-15 MED ORDER — QUETIAPINE FUMARATE 25 MG PO TABS: 25.0000 mg | ORAL_TABLET | Freq: Every day | ORAL | 0 refills | Status: DC

## 2023-04-15 NOTE — Patient Instructions (Signed)
It was great to see you!  Continue the Lexapro once daily.  We will add a new medication called seroquel-- take this once daily at bedtime.  Schedule a virtual visit in 1-2 weeks to follow-up.  We can adjust the dose at that time if needed.  I will have our social work team reach out to you regarding additional help at home.  Take care, Dr Anner Crete

## 2023-04-15 NOTE — Progress Notes (Unsigned)
    SUBJECTIVE:   CHIEF COMPLAINT / HPI:   Dementia Follow-Up Granddaughter reports patient has started having hallucinations more frequently. Becoming disoriented more often. No issues with aggression or wandering. Started Lexapro 5mg  about 2 weeks ago for concerns about anxiety but they haven't noticed any change with this yet. Completely dependent for all iADLs. Independent with ADLs but very high fall risk.  Also want to discuss level of care. Patient lives alone. Granddaughter and home health aide help during the day but patient is alone at night. Granddaughter increasingly more worried about this. Patient has always been adamant she does not want to live in a facility. She does not like leaving the house at all so adult daycare is not a great option (and regardless, this does not solve the night issue). Family unable to afford 24/7 care.   Granddaughter willing to move in, but they need to figure out several logistics first (patient's husband died, house deed has his name-- that sort of thing)  PERTINENT  PMH / PSH: CKD stage IV, HTN  OBJECTIVE:   BP (!) 143/62   Pulse 64   Ht 5\' 9"  (1.753 m)   Wt 145 lb 9.6 oz (66 kg)   SpO2 100%   BMI 21.50 kg/m   General: NAD, pleasant Respiratory: No respiratory distress Skin: warm and dry, no rashes noted Psych: Normal affect, normal speech Neuro: oriented to person and place, no focal deficits noted   ASSESSMENT/PLAN:   Dementia (HCC) With worsening behavioral disturbance. Start seroquel 25mg  at bedtime. Follow-up in 1-2 weeks via virtual visit-- can increase dose at that time if needed. Will reach out to social work team who is familiar with patient case for resources regarding additional care at home. Long term plan is most likely for granddaughter to move in.  Hypertension BP slightly elevated today although diastolic on low side upon repeat measurement. Given patient's dementia and comorbidities, would not recommend strict BP  control. Continue current medications.     Maury Dus, MD [facility]

## 2023-04-16 ENCOUNTER — Telehealth: Payer: Self-pay

## 2023-04-16 NOTE — Assessment & Plan Note (Signed)
BP slightly elevated today although diastolic on low side upon repeat measurement. Given patient's dementia and comorbidities, would not recommend strict BP control. Continue current medications.

## 2023-04-16 NOTE — Telephone Encounter (Signed)
Patients granddaughter calls nurse line requesting a letter of support for social security.   She reports the social security office will not allow her to access her funds etc.. without a letter stating patients diagnosis and stating patients representative [place].   Will forward to PCP.

## 2023-04-16 NOTE — Assessment & Plan Note (Signed)
With worsening behavioral disturbance. Start seroquel 25mg  at bedtime. Follow-up in 1-2 weeks via virtual visit-- can increase dose at that time if needed. Will reach out to social work team who is familiar with patient case for resources regarding additional care at home. Long term plan is most likely for granddaughter to move in.

## 2023-04-17 NOTE — Telephone Encounter (Signed)
Letter placed up front for pick up.   Granddaughter aware.

## 2023-04-17 NOTE — Telephone Encounter (Signed)
Letter written-- see communication tab.   Maury Dus, MD PGY-3, [facility]

## 2023-04-24 ENCOUNTER — Other Ambulatory Visit: Payer: Self-pay | Admitting: Family Medicine

## 2023-05-09 ENCOUNTER — Other Ambulatory Visit: Payer: Self-pay | Admitting: Family Medicine

## 2023-05-14 ENCOUNTER — Encounter: Payer: Self-pay | Admitting: Family Medicine

## 2023-05-15 ENCOUNTER — Other Ambulatory Visit: Payer: Self-pay | Admitting: Family Medicine

## 2023-05-18 NOTE — Progress Notes (Unsigned)
Subjective:   Megan Stevenson is a 82 y.o. female who presents for Medicare Annual (Subsequent) preventive examination.  Review of Systems    ***       Objective:    There were no vitals filed for this visit. There is no height or weight on file to calculate BMI.     04/15/2023    4:26 PM 04/04/2023   11:19 AM 02/07/2023    4:53 PM 02/03/2023    1:36 PM 02/13/2022    2:08 PM 02/07/2022    5:45 PM 10/30/2021   10:59 AM  Advanced Directives  Does Patient Have a Medical Advance Directive? No No No No No No No  Would patient like information on creating a medical advance directive?  No - Patient declined No - Patient declined No - Patient declined No - Patient declined No - Patient declined No - Patient declined    Current Medications (verified) Outpatient Encounter Medications as of 05/19/2023  Medication Sig   allopurinol (ZYLOPRIM) 100 MG tablet Take 100 mg by mouth daily.   amLODipine (NORVASC) 10 MG tablet TAKE 1 TABLET(10 MG) BY MOUTH DAILY   atorvastatin (LIPITOR) 10 MG tablet TAKE 1 TABLET(10 MG) BY MOUTH AT BEDTIME   cloNIDine (CATAPRES) 0.2 MG tablet Take 1 tablet (0.2 mg total) by mouth daily.   diclofenac Sodium (VOLTAREN) 1 % GEL Apply to right knee 2-3 times daily as needed for pain   donepezil (ARICEPT) 10 MG tablet TAKE 1 TABLET(10 MG) BY MOUTH DAILY   escitalopram (LEXAPRO) 5 MG tablet Take 1 tablet (5 mg total) by mouth daily.   FEROSUL 325 (65 Fe) MG tablet TAKE 1 TABLET(325 MG) BY MOUTH DAILY   hydrALAZINE (APRESOLINE) 25 MG tablet Take 25 mg by mouth 2 (two) times daily.   QUEtiapine (SEROQUEL) 25 MG tablet TAKE 1 TABLET(25 MG) BY MOUTH AT BEDTIME   No facility-administered encounter medications on file as of 05/19/2023.    Allergies (verified) Nsaids, Benazepril hcl, Furosemide, and Atenolol   History: Past Medical History:  Diagnosis Date   Anemia    Arthritis    Ataxia 06/13/2018   Bradycardia 06/13/2018   Bradycardia, sinus 06/13/2018   CKD (chronic  kidney disease) stage 4, GFR 15-29 ml/min (HCC) 06/13/2018   Cognitive decline    COLONIC POLYPS, HX OF 07/24/2007   Annotation: 6/06 Qualifier: History of  By: Barbaraann Barthel MD, Benetta Spar     DIVERTICULOSIS, SIGMOID COLON 07/24/2007   Qualifier: Diagnosis of  By: Barbaraann Barthel MD, [place]     Dysrhythmia    Encounter for immunization 10/18/2022   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   Qualifier: Diagnosis of  By: Barbaraann Barthel MD, [place]     Hypertension    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   Brain MRI 06/13/2018: Small deep white matter and basal ganglia lacunar infarcts   Macular hole    OD   Macular hole, 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   Metatarsal fracture, Right foot 08/25/2020   Need for immunization against influenza 10/18/2022   Osteoarthritis of right knee 04/17/2021   Prediabetes    PUD 07/24/2007   Annotation: 10/97 Qualifier: Diagnosis of  By: Barbaraann Barthel MD, [place]     Renal disorder    Sees someone [facility]   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   ROTATOR CUFF SYNDROME, 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   Qualifier: Diagnosis of  By: Barbaraann Barthel MD, Felicity Coyer BURSITIS, 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   Qualifier: Diagnosis of  By: Barbaraann Barthel MD, [place]  Wears glasses    Past Surgical History:  Procedure Laterality Date   25 GAUGE PARS PLANA VITRECTOMY WITH 20 GAUGE MVR PORT FOR MACULAR HOLE Right 06/14/2014   Procedure: 25 GAUGE PARS PLANA VITRECTOMY WITH 20 GAUGE MVR PORT FOR MACULAR HOLE; serum patch; gas injection; laser treament; C3F8 gas fluid exchange.;  Surgeon: Sherrie George, MD;  Location: [facility] OR;  Service: Ophthalmology;  Laterality: Right;   ABDOMINAL HYSTERECTOMY     EYE SURGERY     GANGLION CYST EXCISION Left 02/02/2015   Procedure: EXCISION OF VOLAR GANGLION CYST LEFT WRIST;  Surgeon: Betha Loa, MD;  Location: [facility];  Service: Orthopedics;  Laterality: Left;   GAS INSERTION Right 07/19/2014   Procedure: INSERTION OF GAS;  Surgeon: Sherrie George, MD;  Location: [facility] OR;   Service: Ophthalmology;  Laterality: Right;  C3F8   LASER PHOTO ABLATION Right 07/19/2014   Procedure: LASER PHOTO ABLATION;  Surgeon: Sherrie George, MD;  Location[facility];  Service: Ophthalmology;  Laterality: Right;   PARS PLANA VITRECTOMY W/ REPAIR OF MACULAR HOLE Right 06/14/2014   DR MATTHEWS   SCLERAL BUCKLE Right 07/19/2014   Procedure: SCLERAL BUCKLE;  Surgeon: Sherrie George, MD;  Location: [facility] OR;  Service: Ophthalmology;  Laterality: Right;   Family History  Problem Relation Age of Onset   Healthy Mother    Healthy Father    Hypertension Sister    Social History   Socioeconomic History   Marital status: Divorced    Spouse name: Not on file   Number of children: 1   Years of education: 9   Highest education level: 9th grade  Occupational History   Not on file  Tobacco Use   Smoking status: Former    Packs/day: 0.25    Years: 3.00    Additional pack years: 0.00    Total pack years: 0.75    Types: Cigarettes    Quit date: 12/09/1993    Years since quitting: 29.4   Smokeless tobacco: Never  Vaping Use   Vaping Use: Never used  Substance and Sexual Activity   Alcohol use: No   Drug use: No   Sexual activity: Not Currently  Other Topics Concern   Not on file  Social History Narrative   Social Information ( 06/07/21)   patient lives with husband who is blind and step son  . Patient enjoys watching TV and spends most of her time in bed . Primary family support person is sister [name] who lives near by, support is limited as sister also has health concerns and caring for her minor grandchildren.    Transportation to appointments provided by sister [name];        Basic Activities of Daily Living per sister Christianne Borrow   Dressing:Self-care   Eating: Self-care   Ambulation: Partial assistance ( uses a walker or cane)   Toileting: Self-care   Bathing: Self-care       Instrumental Activities of Daily Living   Shopping: Total assistance   House/Yard Work: Associate Professor of medications: Partial assistance   Finances: Total assistance   Telephone: Partial assistance   Transportation: Total assistance   Social Determinants of Health   Financial Resource Strain: Low Risk  (07/03/2021)   Overall Financial Resource Strain (CARDIA)    Difficulty of Paying Living Expenses: Not hard at all  Food Insecurity: No Food Insecurity (07/11/2022)   Hunger Vital Sign    Worried About Running Out of  Food in the Last Year: Never true    Ran Out of Food in the Last Year: Never true  Transportation Needs: No Transportation Needs (07/11/2022)   PRAPARE - Administrator, Civil Service (Medical): No    Lack of Transportation (Non-Medical): No  Physical Activity: Insufficiently Active (07/03/2021)   Exercise Vital Sign    Days of Exercise per Week: 2 days    Minutes of Exercise per Session: 30 min  Stress: No Stress Concern Present (07/03/2021)   [employer] of Occupational Health - Occupational Stress Questionnaire    Feeling of Stress : Only a little  Social Connections: Moderately Isolated (07/03/2021)   Social Connection and Isolation Panel [NHANES]    Frequency of Communication with Friends and Family: More than three times a week    Frequency of Social Gatherings with Friends and Family: More than three times a week    Attends Religious Services: More than 4 times per year    Active Member of [employer] or Organizations: No    Attends Banker Meetings: Never    Marital Status: Divorced    Tobacco Counseling Counseling given: Not Answered   Clinical Intake:                 Diabetic?No          Activities of Daily Living     No data to display          Patient Care Team: Maury Dus, MD as PCP - General (Family Medicine)  Indicate any recent Medical Services you may have received from other than [facility] providers in the past year (date may be approximate).     Assessment:   This is a  routine wellness examination for Megan Stevenson.  Hearing/Vision screen No results found.  Dietary issues and exercise activities discussed:     Goals Addressed   None    Depression Screen    04/04/2023   11:18 AM 02/07/2023    4:53 PM 02/03/2023    1:38 PM 02/03/2023    1:36 PM 06/27/2022    4:21 PM 02/13/2022    2:06 PM 10/30/2021   10:59 AM  PHQ 2/9 Scores  PHQ - 2 Score 2 3 3  5 1 2   PHQ- 9 Score 11 8 9  19 10 6   Exception Documentation    Patient refusal       Fall Risk    04/04/2023   11:19 AM 02/03/2023    1:36 PM 02/13/2022    2:07 PM 01/22/2022   11:24 AM 10/30/2021   11:01 AM  Fall Risk   Falls in the past year? 0 0 0 1 0  Number falls in past yr: 0 0 0 1 0  Injury with Fall? 0 0 0 0 0  Comment    Scar on her face   Risk for fall due to :    History of fall(s);Impaired balance/gait;Impaired mobility;Orthopedic patient   Risk for fall due to: Comment    Unstable knee but will not use walker or wheelchair   Follow up    Falls evaluation completed;Falls prevention discussed     FALL RISK PREVENTION PERTAINING TO THE HOME:  Any stairs in or around the home? {YES/NO:21197} If so, are there any without handrails? {YES/NO:21197} Home free of loose throw rugs in walkways, pet beds, electrical cords, etc? {YES/NO:21197} Adequate lighting in your home to reduce risk of falls? {YES/NO:21197}  ASSISTIVE DEVICES UTILIZED TO PREVENT FALLS:  Life  alert? {YES/NO:21197} Use of a cane, walker or w/c? {YES/NO:21197} Grab bars in the bathroom? {YES/NO:21197} Shower chair or bench in shower? {YES/NO:21197} Elevated toilet seat or a handicapped toilet? {YES/NO:21197}  TIMED UP AND GO:  Was the test performed? No . Telephonic visit   Cognitive Function:      06/18/2021   12:13 PM  Montreal Cognitive Assessment   Visuospatial/ Executive (0/5) 0  Naming (0/3) 1  Attention: Read list of digits (0/2) 0  Attention: Read list of letters (0/1) 1  Attention: Serial 7 subtraction  starting at 100 (0/3) 0  Language: Repeat phrase (0/2) 0  Language : Fluency (0/1) 0  Abstraction (0/2) 0  Delayed Recall (0/5) 0  Orientation (0/6) 2  Total 4  Adjusted Score (based on education) 5      07/03/2021   11:17 AM  6CIT Screen  What Year? 4 points  What month? 3 points  What time? 0 points  Count back from 20 0 points  Months in reverse 4 points  Repeat phrase 4 points  Total Score 15 points    Immunizations Immunization History  Administered Date(s) Administered   COVID-19, mRNA, vaccine(Comirnaty)12 years and older 10/17/2022   Fluad Quad(high Dose 65+) 10/30/2021, 10/17/2022   Influenza Whole 12/25/2007   PFIZER Comirnaty(Gray Top)Covid-19 Tri-Sucrose Vaccine 04/17/2021, 05/08/2021, 10/30/2021   PNEUMOCOCCAL CONJUGATE-20 06/27/2022   Pneumococcal Polysaccharide-23 12/10/2004   Td 12/09/1996, 08/11/2007   Tdap 08/02/2021    TDAP status: Up to date  Pneumococcal vaccine status: Up to date  Covid-19 vaccine status: Information provided on how to obtain vaccines.   Qualifies for Shingles Vaccine? Yes   Zostavax completed No   Shingrix Completed?: No.    Education has been provided regarding the importance of this vaccine. Patient has been advised to call insurance company to determine out of pocket expense if they have not yet received this vaccine. Advised may also receive vaccine at local pharmacy or Health Dept. Verbalized acceptance and understanding.  Screening Tests Health Maintenance  Topic Date Due   Zoster Vaccines- Shingrix (1 of 2) Never done   Medicare Annual Wellness (AWV)  07/03/2022   COVID-19 Vaccine (5 - 2023-24 season) 12/12/2022   INFLUENZA VACCINE  07/10/2023   DTaP/Tdap/Td (4 - Td or Tdap) 08/03/2031   Pneumonia Vaccine 77+ Years old  Completed   DEXA SCAN  Completed   HPV VACCINES  Aged Out    Health Maintenance  Health Maintenance Due  Topic Date Due   Zoster Vaccines- Shingrix (1 of 2) Never done   Medicare Annual  Wellness (AWV)  07/03/2022   COVID-19 Vaccine (5 - 2023-24 season) 12/12/2022    Colorectal cancer screening: No longer required.   Mammogram status: No longer required due to age and preference .  Bone Density status: Completed 04/10/11. Results reflect: {Bone density results:[id number]}  Lung Cancer Screening: (Low Dose CT Chest recommended if Age 77-80 years, 30 pack-year currently smoking OR have quit w/in 15years.) does not qualify.   Lung Cancer Screening Referral: n/a  Additional Screening:  Hepatitis C Screening: does not qualify;  Vision Screening: Recommended annual ophthalmology exams for early detection of glaucoma and other disorders of the eye. Is the patient up to date with their annual eye exam?  {YES/NO:21197} Who is the provider or what is the name of the office in which the patient attends annual eye exams? *** If pt is not established with a provider, would they like to be referred to a provider to establish  care? {YES/NO:21197}.   Dental Screening: Recommended annual dental exams for proper oral hygiene  Community Resource Referral / Chronic Care Management: CRR required this visit?  {YES/NO:21197}  CCM required this visit?  {YES/NO:21197}     Plan:     I have personally reviewed and noted the following in the patient's chart:   Medical and social history Use of alcohol, tobacco or illicit drugs  Current medications and supplements including opioid prescriptions. {Opioid Prescriptions:[phone]} Functional ability and status Nutritional status Physical activity Advanced directives List of other physicians Hospitalizations, surgeries, and ER visits in previous 12 months Vitals Screenings to include cognitive, depression, and falls Referrals and appointments  In addition, I have reviewed and discussed with patient certain preventive protocols, quality metrics, and best practice recommendations. A written personalized care plan for preventive services  as well as general preventive health recommendations were provided to patient.     Durwin Nora, [place]   08/15/2951   Due to this being a virtual visit, the after visit summary with patients personalized plan was offered to patient via mail or my-chart. ***Patient declined at this time./ Patient would like to access on my-chart/ per request, patient was mailed a copy of AVS./ Patient preferred to pick up at office at next visit  Nurse Notes: ***

## 2023-05-18 NOTE — Patient Instructions (Incomplete)
Ms. Megan Stevenson , Thank you for taking time to come for your Medicare Wellness Visit. I appreciate your ongoing commitment to your health goals. Please review the following plan we discussed and let me know if I can assist you in the future.   These are the goals we discussed:  Goals      Prevent falls        This is a list of the screening recommended for you and due dates:  Health Maintenance  Topic Date Due   Zoster (Shingles) Vaccine (1 of 2) Never done   COVID-19 Vaccine (5 - 2023-24 season) 12/12/2022   Flu Shot  07/10/2023   Medicare Annual Wellness Visit  05/18/2024   DTaP/Tdap/Td vaccine (4 - Td or Tdap) 08/03/2031   Pneumonia Vaccine  Completed   DEXA scan (bone density measurement)  Completed   HPV Vaccine  Aged Out    Advanced directives: We have a copy of your advanced directives available in your record should your provider ever need to access them.   Conditions/risks identified: Aim for 30 minutes of exercise or brisk walking, 6-8 glasses of water, and 5 servings of fruits and vegetables each day.  Next appointment: Follow up in one year for your annual wellness visit    Preventive Care 65 Years and Older, Female Preventive care refers to lifestyle choices and visits with your health care provider that can promote health and wellness. What does preventive care include? A yearly physical exam. This is also called an annual well check. Dental exams once or twice a year. Routine eye exams. Ask your health care provider how often you should have your eyes checked. Personal lifestyle choices, including: Daily care of your teeth and gums. Regular physical activity. Eating a healthy diet. Avoiding tobacco and drug use. Limiting alcohol use. Practicing safe sex. Taking low-dose aspirin every day. Taking vitamin and mineral supplements as recommended by your health care provider. What happens during an annual well check? The services and screenings done by your health  care provider during your annual well check will depend on your age, overall health, lifestyle risk factors, and family history of disease. Counseling  Your health care provider may ask you questions about your: Alcohol use. Tobacco use. Drug use. Emotional well-being. Home and relationship well-being. Sexual activity. Eating habits. History of falls. Memory and ability to understand (cognition). Work and work Astronomer. Reproductive health. Screening  You may have the following tests or measurements: Height, weight, and BMI. Blood pressure. Lipid and cholesterol levels. These may be checked every 5 years, or more frequently if you are over 70 years old. Skin check. Lung cancer screening. You may have this screening every year starting at age 82 if you have a 30-pack-year history of smoking and currently smoke or have quit within the past 15 years. Fecal occult blood test (FOBT) of the stool. You may have this test every year starting at age 82. Flexible sigmoidoscopy or colonoscopy. You may have a sigmoidoscopy every 5 years or a colonoscopy every 10 years starting at age 82. Hepatitis C blood test. Hepatitis B blood test. Sexually transmitted disease (STD) testing. Diabetes screening. This is done by checking your blood sugar (glucose) after you have not eaten for a while (fasting). You may have this done every 1-3 years. Bone density scan. This is done to screen for osteoporosis. You may have this done starting at age 82. Mammogram. This may be done every 1-2 years. Talk to your health care provider about  how often you should have regular mammograms. Talk with your health care provider about your test results, treatment options, and if necessary, the need for more tests. Vaccines  Your health care provider may recommend certain vaccines, such as: Influenza vaccine. This is recommended every year. Tetanus, diphtheria, and acellular pertussis (Tdap, Td) vaccine. You may need a Td  booster every 10 years. Zoster vaccine. You may need this after age 82. Pneumococcal 13-valent conjugate (PCV13) vaccine. One dose is recommended after age 82. Pneumococcal polysaccharide (PPSV23) vaccine. One dose is recommended after age 82. Talk to your health care provider about which screenings and vaccines you need and how often you need them. This information is not intended to replace advice given to you by your health care provider. Make sure you discuss any questions you have with your health care provider. Document Released: 12/22/2015 Document Revised: 08/14/2016 Document Reviewed: 09/26/2015 Elsevier Interactive Patient Education  2017 [place] Prevention in the Home Falls can cause injuries. They can happen to people of all ages. There are many things you can do to make your home safe and to help prevent falls. What can I do on the outside of my home? Regularly fix the edges of walkways and driveways and fix any cracks. Remove anything that might make you trip as you walk through a door, such as a raised step or threshold. Trim any bushes or trees on the path to your home. Use bright outdoor lighting. Clear any walking paths of anything that might make someone trip, such as rocks or tools. Regularly check to see if handrails are loose or broken. Make sure that both sides of any steps have handrails. Any raised decks and porches should have guardrails on the edges. Have any leaves, snow, or ice cleared regularly. Use sand or salt on walking paths during winter. Clean up any spills in your garage right away. This includes oil or grease spills. What can I do in the bathroom? Use night lights. Install grab bars by the toilet and in the tub and shower. Do not use towel bars as grab bars. Use non-skid mats or decals in the tub or shower. If you need to sit down in the shower, use a plastic, non-slip stool. Keep the floor dry. Clean up any water that spills on the floor  as soon as it happens. Remove soap buildup in the tub or shower regularly. Attach bath mats securely with double-sided non-slip rug tape. Do not have throw rugs and other things on the floor that can make you trip. What can I do in the bedroom? Use night lights. Make sure that you have a light by your bed that is easy to reach. Do not use any sheets or blankets that are too big for your bed. They should not hang down onto the floor. Have a firm chair that has side arms. You can use this for support while you get dressed. Do not have throw rugs and other things on the floor that can make you trip. What can I do in the kitchen? Clean up any spills right away. Avoid walking on wet floors. Keep items that you use a lot in easy-to-reach places. If you need to reach something above you, use a strong step stool that has a grab bar. Keep electrical cords out of the way. Do not use floor polish or wax that makes floors slippery. If you must use wax, use non-skid floor wax. Do not have throw rugs and other  things on the floor that can make you trip. What can I do with my stairs? Do not leave any items on the stairs. Make sure that there are handrails on both sides of the stairs and use them. Fix handrails that are broken or loose. Make sure that handrails are as long as the stairways. Check any carpeting to make sure that it is firmly attached to the stairs. Fix any carpet that is loose or worn. Avoid having throw rugs at the top or bottom of the stairs. If you do have throw rugs, attach them to the floor with carpet tape. Make sure that you have a light switch at the top of the stairs and the bottom of the stairs. If you do not have them, ask someone to add them for you. What else can I do to help prevent falls? Wear shoes that: Do not have high heels. Have rubber bottoms. Are comfortable and fit you well. Are closed at the toe. Do not wear sandals. If you use a stepladder: Make sure that it is  fully opened. Do not climb a closed stepladder. Make sure that both sides of the stepladder are locked into place. Ask someone to hold it for you, if possible. Clearly mark and make sure that you can see: Any grab bars or handrails. First and last steps. Where the edge of each step is. Use tools that help you move around (mobility aids) if they are needed. These include: Canes. Walkers. Scooters. Crutches. Turn on the lights when you go into a dark area. Replace any light bulbs as soon as they burn out. Set up your furniture so you have a clear path. Avoid moving your furniture around. If any of your floors are uneven, fix them. If there are any pets around you, be aware of where they are. Review your medicines with your doctor. Some medicines can make you feel dizzy. This can increase your chance of falling. Ask your doctor what other things that you can do to help prevent falls. This information is not intended to replace advice given to you by your health care provider. Make sure you discuss any questions you have with your health care provider. Document Released: 09/21/2009 Document Revised: 05/02/2016 Document Reviewed: 12/30/2014 Elsevier Interactive Patient Education  2017 [employer].

## 2023-05-19 ENCOUNTER — Ambulatory Visit (INDEPENDENT_AMBULATORY_CARE_PROVIDER_SITE_OTHER): Payer: Medicare Other

## 2023-05-19 VITALS — Ht 69.0 in | Wt 145.0 lb

## 2023-05-19 DIAGNOSIS — Z0000 Encounter for general adult medical examination without abnormal findings: Secondary | ICD-10-CM

## 2023-06-13 ENCOUNTER — Encounter: Payer: Self-pay | Admitting: Student

## 2023-06-13 ENCOUNTER — Ambulatory Visit (INDEPENDENT_AMBULATORY_CARE_PROVIDER_SITE_OTHER): Payer: Medicare Other | Admitting: Student

## 2023-06-13 VITALS — BP 106/55 | HR 55 | Ht 69.0 in | Wt 144.4 lb

## 2023-06-13 DIAGNOSIS — R5383 Other fatigue: Secondary | ICD-10-CM

## 2023-06-13 MED ORDER — ENSURE ACTIVE HIGH PROTEIN PO LIQD
1.00 | Freq: Two times a day (BID) | ORAL | 2 refills | Status: AC
Start: 2023-06-13 — End: ?

## 2023-06-13 NOTE — Patient Instructions (Addendum)
It was wonderful to meet you today. Thank you for allowing me to be a part of your care. Below is a short summary of what we discussed at your visit today:  Multiple things could cause the weakness and decreased appetite you are currently having, infection could be one of them or progression of your dementia.Marland Kitchen  However we will check your blood count today and also check your electrolyte levels to better understand what is going on.  It is important that you continue to eat and make sure that you are staying well-hydrated.  I think you would benefit from using Ensure supplements in addition to your food.  Sent in prescription for Ensure supplements.  You should take at least twice daily.  Follow-up in 2 weeks or earlier as needed.  Please bring all of your medications to every appointment!  If you have any questions or concerns, please do not hesitate to contact us via phone or MyChart message.   Jerre Simon, MD [facility]

## 2023-06-13 NOTE — Progress Notes (Cosign Needed Addendum)
    SUBJECTIVE:   CHIEF COMPLAINT / HPI:   82 year old female with history of dementia presenting today due to concerns of generalized fatigue and poor p.o. intake.  According to sister who accompanied patient today patient has had decreased p.o. intake within the last week and reports feeling generally weak.  Patient mostly lives alone and sister usually will cook and bring food for patient however patient barely eats the food.  Patient said this is due to poor appetite not necessarily issues with taste.  Appetite has mostly been up and down in the last couple of months.  Sister stated patient has gradually had decline in p.o. intake over the last couple of months.  She denies any fever, chills, diarrhea, constipation, vomiting, and no known sick contacts.  Sister has not noticed any blood in stool and denies use of NSAID.   PERTINENT  PMH / PSH: Reviewed   OBJECTIVE:   BP (!) 106/55   Pulse (!) 55   Ht 5\' 9"  (1.753 m)   Wt 144 lb 6.4 oz (65.5 kg)   SpO2 100%   BMI 21.32 kg/m    Physical Exam General: Alert, elderly lady, NAD Cardiovascular: RRR, No Murmurs, Normal S2/S2 Respiratory: CTAB, No wheezing or Rales Abdomen: No distension or tenderness Extremities: No edema on extremities   Skin: Warm and dry, Pale appearing Neuro: ANO x 1 to person, no neurological deficits.  ASSESSMENT/PLAN:   Generalized fatigue Unclear cause of patient's generalized fatigue however considered in the differential include normal process of aging, infection, poor PO intake, anemia or deconditioning . Most likely causes include possible anemia given generally pale on exam, poor p.o. intake as reported by patient and sister or deconditioning with old age .  Poor p.o. intake could be attributed to worsening dementia as patient currently lives alone. Discussed with daughter about need for patient to move to a facility where she could be monitored closely and p.o. intake can be verified adequately.  Sister  will discuss with family and follow-up at next visit. -Rx Ensure supplements to increase daily caloric intake -Encourage patient to continue eating -Ordered CBC and BMP -Discussed benefits of patient moving to a living facility -Reviewed fall precautions -Follow-up in 2 weeks   Jerre Simon, MD [facility]

## 2023-06-14 ENCOUNTER — Other Ambulatory Visit: Payer: Self-pay

## 2023-06-14 ENCOUNTER — Observation Stay (HOSPITAL_COMMUNITY)
Admission: EM | Admit: 2023-06-14 | Discharge: 2023-06-17 | Disposition: A | Discharge status: Home / Self Care | Payer: Medicare Other | Source: Home / Self Care | Attending: Emergency Medicine | Admitting: Emergency Medicine

## 2023-06-14 ENCOUNTER — Telehealth: Payer: Self-pay | Admitting: Student

## 2023-06-14 DIAGNOSIS — R5383 Other fatigue: Secondary | ICD-10-CM | POA: Diagnosis present

## 2023-06-14 DIAGNOSIS — Z9181 History of falling: Secondary | ICD-10-CM | POA: Insufficient documentation

## 2023-06-14 DIAGNOSIS — M6281 Muscle weakness (generalized): Secondary | ICD-10-CM | POA: Diagnosis not present

## 2023-06-14 DIAGNOSIS — F0390 Unspecified dementia without behavioral disturbance: Secondary | ICD-10-CM | POA: Insufficient documentation

## 2023-06-14 DIAGNOSIS — Z87891 Personal history of nicotine dependence: Secondary | ICD-10-CM | POA: Diagnosis not present

## 2023-06-14 DIAGNOSIS — I129 Hypertensive chronic kidney disease with stage 1 through stage 4 chronic kidney disease, or unspecified chronic kidney disease: Secondary | ICD-10-CM | POA: Insufficient documentation

## 2023-06-14 DIAGNOSIS — R2681 Unsteadiness on feet: Secondary | ICD-10-CM | POA: Diagnosis not present

## 2023-06-14 DIAGNOSIS — K449 Diaphragmatic hernia without obstruction or gangrene: Secondary | ICD-10-CM | POA: Diagnosis not present

## 2023-06-14 DIAGNOSIS — Z79899 Other long term (current) drug therapy: Secondary | ICD-10-CM | POA: Insufficient documentation

## 2023-06-14 DIAGNOSIS — I10 Essential (primary) hypertension: Secondary | ICD-10-CM

## 2023-06-14 DIAGNOSIS — D649 Anemia, unspecified: Secondary | ICD-10-CM | POA: Diagnosis not present

## 2023-06-14 DIAGNOSIS — R195 Other fecal abnormalities: Secondary | ICD-10-CM | POA: Diagnosis not present

## 2023-06-14 DIAGNOSIS — D509 Iron deficiency anemia, unspecified: Principal | ICD-10-CM | POA: Insufficient documentation

## 2023-06-14 DIAGNOSIS — N184 Chronic kidney disease, stage 4 (severe): Secondary | ICD-10-CM | POA: Insufficient documentation

## 2023-06-14 DIAGNOSIS — R001 Bradycardia, unspecified: Secondary | ICD-10-CM | POA: Diagnosis not present

## 2023-06-14 LAB — CBC
HCT: 22.7 % — ABNORMAL LOW (ref 36.0–46.0)
Hematocrit: 22.4 % — ABNORMAL LOW (ref 34.0–46.6)
Hemoglobin: 6.5 g/dL — CL (ref 11.1–15.9)
Hemoglobin: 6.5 g/dL — CL (ref 12.0–15.0)
MCH: 22.7 pg — ABNORMAL LOW (ref 26.6–33.0)
MCH: 23.5 pg — ABNORMAL LOW (ref 26.0–34.0)
MCHC: 29 g/dL — ABNORMAL LOW (ref 31.5–35.7)
MCHC: 28.6 g/dL — ABNORMAL LOW (ref 30.0–36.0)
MCV: 78 fL — ABNORMAL LOW (ref 79–97)
MCV: 81.9 fL (ref 80.0–100.0)
Platelets: 454 10*3/uL — ABNORMAL HIGH (ref 150–450)
Platelets: 405 10*3/uL — ABNORMAL HIGH (ref 150–400)
RBC: 2.86 x10E6/uL — ABNORMAL LOW (ref 3.77–5.28)
RBC: 2.77 MIL/uL — ABNORMAL LOW (ref 3.87–5.11)
RDW: 14.9 % (ref 11.7–15.4)
RDW: 15.8 % — ABNORMAL HIGH (ref 11.5–15.5)
WBC: 5.6 10*3/uL (ref 3.4–10.8)
WBC: 5.3 10*3/uL (ref 4.0–10.5)
nRBC: 0 % (ref 0.0–0.2)

## 2023-06-14 LAB — TYPE AND SCREEN

## 2023-06-14 LAB — BASIC METABOLIC PANEL
BUN/Creatinine Ratio: 11 — ABNORMAL LOW (ref 12–28)
BUN: 34 mg/dL — ABNORMAL HIGH (ref 8–27)
CO2: 18 mmol/L — ABNORMAL LOW (ref 20–29)
Calcium: 9.2 mg/dL (ref 8.7–10.3)
Chloride: 109 mmol/L — ABNORMAL HIGH (ref 96–106)
Creatinine, Ser: 3.08 mg/dL — ABNORMAL HIGH (ref 0.57–1.00)
Glucose: 95 mg/dL (ref 70–99)
Potassium: 4.2 mmol/L (ref 3.5–5.2)
Sodium: 142 mmol/L (ref 134–144)
eGFR: 15 mL/min/{1.73_m2} — ABNORMAL LOW (ref >59)

## 2023-06-14 LAB — BPAM RBC
Blood Product Expiration Date: 202407132359
ISSUE DATE / TIME: 202407061559
Unit Type and Rh: 6200
Unit Type and Rh: 6200

## 2023-06-14 LAB — COMPREHENSIVE METABOLIC PANEL
ALT: 11 U/L (ref 0–44)
AST: 12 U/L — ABNORMAL LOW (ref 15–41)
Albumin: 3.3 g/dL — ABNORMAL LOW (ref 3.5–5.0)
Alkaline Phosphatase: 58 U/L (ref 38–126)
Anion gap: 8 (ref 5–15)
BUN: 35 mg/dL — ABNORMAL HIGH (ref 8–23)
CO2: 19 mmol/L — ABNORMAL LOW (ref 22–32)
Calcium: 8.6 mg/dL — ABNORMAL LOW (ref 8.9–10.3)
Chloride: 113 mmol/L — ABNORMAL HIGH (ref 98–111)
Creatinine, Ser: 3.35 mg/dL — ABNORMAL HIGH (ref 0.44–1.00)
GFR, Estimated: 13 mL/min — ABNORMAL LOW (ref >60)
Glucose, Bld: 91 mg/dL (ref 70–99)
Potassium: 3.9 mmol/L (ref 3.5–5.1)
Sodium: 140 mmol/L (ref 135–145)
Total Bilirubin: 0.5 mg/dL (ref 0.3–1.2)
Total Protein: 6 g/dL — ABNORMAL LOW (ref 6.5–8.1)

## 2023-06-14 LAB — FERRITIN: Ferritin: 7 ng/mL — ABNORMAL LOW (ref 11–307)

## 2023-06-14 LAB — IRON AND TIBC
Iron: 21 ug/dL — ABNORMAL LOW (ref 28–170)
Saturation Ratios: 6 % — ABNORMAL LOW (ref 10.4–31.8)
TIBC: 347 ug/dL (ref 250–450)
UIBC: 326 ug/dL

## 2023-06-14 LAB — PROTIME-INR
INR: 1.1 (ref 0.8–1.2)
Prothrombin Time: 14.2 seconds (ref 11.4–15.2)

## 2023-06-14 LAB — RETICULOCYTES
Immature Retic Fract: 10.4 % (ref 2.3–15.9)
RBC.: 2.81 MIL/uL — ABNORMAL LOW (ref 3.87–5.11)
Retic Count, Absolute: 56.8 10*3/uL (ref 19.0–186.0)
Retic Ct Pct: 2 % (ref 0.4–3.1)

## 2023-06-14 LAB — PREPARE RBC (CROSSMATCH)

## 2023-06-14 LAB — CBG MONITORING, ED: Glucose-Capillary: 75 mg/dL (ref 70–99)

## 2023-06-14 LAB — POC OCCULT BLOOD, ED: Fecal Occult Bld: POSITIVE — AB

## 2023-06-14 LAB — VITAMIN B12: Vitamin B-12: 261 pg/mL (ref 180–914)

## 2023-06-14 LAB — FOLATE: Folate: 10 ng/mL (ref >5.9)

## 2023-06-14 MED ORDER — ACETAMINOPHEN 650 MG RE SUPP: 650.0000 mg | Freq: Four times a day (QID) | RECTAL | Status: DC | PRN

## 2023-06-14 MED ORDER — AMLODIPINE BESYLATE 10 MG PO TABS
10.0000 mg | ORAL_TABLET | Freq: Every day | ORAL | Status: DC
  Administered 2023-06-14 – 2023-06-17 (×4): 10 mg via ORAL
  Filled 2023-06-14 (×4): qty 1

## 2023-06-14 MED ORDER — ONDANSETRON HCL 4 MG/2ML IJ SOLN
4.0000 mg | Freq: Four times a day (QID) | INTRAMUSCULAR | Status: DC | PRN
  Administered 2023-06-16: 4 mg via INTRAVENOUS
  Filled 2023-06-14: qty 2

## 2023-06-14 MED ORDER — CLONIDINE HCL 0.1 MG PO TABS
0.2000 mg | ORAL_TABLET | Freq: Every day | ORAL | Status: DC
  Administered 2023-06-14 – 2023-06-17 (×4): 0.2 mg via ORAL
  Filled 2023-06-14 (×6): qty 2

## 2023-06-14 MED ORDER — ATORVASTATIN CALCIUM 10 MG PO TABS
10.0000 mg | ORAL_TABLET | Freq: Every day | ORAL | Status: DC
  Administered 2023-06-14 – 2023-06-17 (×4): 10 mg via ORAL
  Filled 2023-06-14 (×4): qty 1

## 2023-06-14 MED ORDER — QUETIAPINE FUMARATE 25 MG PO TABS
25.0000 mg | ORAL_TABLET | Freq: Every day | ORAL | Status: DC
  Administered 2023-06-14 – 2023-06-16 (×3): 25 mg via ORAL
  Filled 2023-06-14 (×3): qty 1

## 2023-06-14 MED ORDER — POLYETHYLENE GLYCOL 3350 17 G PO PACK: 17.0000 g | PACK | Freq: Every day | ORAL | Status: DC | PRN

## 2023-06-14 MED ORDER — DONEPEZIL HCL 10 MG PO TABS
10.0000 mg | ORAL_TABLET | Freq: Every day | ORAL | Status: DC
  Administered 2023-06-14 – 2023-06-16 (×3): 10 mg via ORAL
  Filled 2023-06-14 (×3): qty 1

## 2023-06-14 MED ORDER — HYDRALAZINE HCL 50 MG PO TABS
25.0000 mg | ORAL_TABLET | Freq: Two times a day (BID) | ORAL | Status: DC
  Administered 2023-06-14 – 2023-06-16 (×4): 25 mg via ORAL
  Filled 2023-06-14 (×6): qty 1

## 2023-06-14 MED ORDER — DICLOFENAC SODIUM 1 % EX GEL
4.0000 g | Freq: Four times a day (QID) | CUTANEOUS | Status: DC
  Administered 2023-06-14 – 2023-06-16 (×5): 4 g via TOPICAL
  Filled 2023-06-14: qty 100

## 2023-06-14 MED ORDER — ACETAMINOPHEN 325 MG PO TABS: 650.0000 mg | ORAL_TABLET | Freq: Four times a day (QID) | ORAL | Status: DC | PRN

## 2023-06-14 MED ORDER — PANTOPRAZOLE SODIUM 40 MG PO TBEC
40.0000 mg | DELAYED_RELEASE_TABLET | Freq: Two times a day (BID) | ORAL | Status: DC
  Administered 2023-06-14 – 2023-06-17 (×6): 40 mg via ORAL
  Filled 2023-06-14 (×6): qty 1

## 2023-06-14 MED ORDER — ESCITALOPRAM OXALATE 10 MG PO TABS
5.0000 mg | ORAL_TABLET | Freq: Every day | ORAL | Status: DC
  Administered 2023-06-14 – 2023-06-16 (×3): 5 mg via ORAL
  Filled 2023-06-14 (×2): qty 1
  Filled 2023-06-14: qty 0.5
  Filled 2023-06-14: qty 1

## 2023-06-14 MED ORDER — SODIUM CHLORIDE 0.9% IV SOLUTION: Freq: Once | INTRAVENOUS | Status: AC

## 2023-06-14 MED ORDER — ONDANSETRON HCL 4 MG PO TABS: 4.0000 mg | ORAL_TABLET | Freq: Four times a day (QID) | ORAL | Status: DC | PRN

## 2023-06-14 NOTE — Assessment & Plan Note (Addendum)
Hgb 6.5; 2 units RBC ordered in the ED. VS stable.  - Admit to [facility], attending Dr. Deirdre Priest - med-surg, Vital signs per floor - Regular diet  - PT/OT to treat - VTE prophylaxis: SCDs, high risk bleeding  - AM CBC/BMP  - Fall precautions - Delirium precautions - Protonix 40 mg BID  - Avoid NSAIDs  - Consider GI consult in the AM

## 2023-06-14 NOTE — ED Notes (Signed)
ED TO INPATIENT HANDOFF REPORT  ED Nurse Name and Phone #: Beatris Ship RN [phone]  S Name/Age/Gender Megan Stevenson 82 y.o. female Room/Bed: 029C/029C  Code Status   Code Status: Full Code  Home/SNF/Other Home Patient oriented to: self, place, time, and situation Is this baseline? Yes   Triage Complete: Triage complete  Chief Complaint Anemia [D64.9]  Triage Note Pt. Stated, sent from Dr's office due to HGB being 6.8 and Ive been really tired.  Hgb 6.5   Allergies Allergies  Allergen Reactions   Nsaids Other (See Comments)    REACTION: Has chronic renal disease   Benazepril Hcl Other (See Comments)    REACTION: Dizziness   Furosemide Other (See Comments)    REACTION: Hypernatremia   Atenolol Other (See Comments)    REACTION: Headaches    Level of Care/Admitting Diagnosis ED Disposition     ED Disposition  Admit   Condition  --   Comment  Hospital Area: [facility] [[id number]]  Level of Care: Med-Surg [[id number]]  May place patient in observation at [facility] or [facility] if equivalent level of care is available:: Yes  Covid Evaluation: Asymptomatic - no recent exposure (last 10 days) testing not required  Diagnosis: Anemia [960454]  Admitting Physician: Carney Living [[id number]]  Attending Physician: Carney Living [[id number]]          B Medical/Surgery History Past Medical History:  Diagnosis Date   Anemia    Arthritis    Ataxia 06/13/2018   Bradycardia 06/13/2018   Bradycardia, sinus 06/13/2018   CKD (chronic kidney disease) stage 4, GFR 15-29 ml/min (HCC) 06/13/2018   Cognitive decline    COLONIC POLYPS, HX OF 07/24/2007   Annotation: 6/06 Qualifier: History of  By: Barbaraann Barthel MD, Donnal Debar, SIGMOID COLON 07/24/2007   Qualifier: Diagnosis of  By: Barbaraann Barthel MD, [place]     Dysrhythmia    Encounter for immunization 10/18/2022   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   Qualifier: Diagnosis of  By: Barbaraann Barthel MD, [place]     Hypertension     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   Brain MRI 06/13/2018: Small deep white matter and basal ganglia lacunar infarcts   Macular hole    OD   Macular hole, 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   Metatarsal fracture, Right foot 08/25/2020   Need for immunization against influenza 10/18/2022   Osteoarthritis of right knee 04/17/2021   Prediabetes    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/24/2007   Annotation: 10/97 Qualifier: Diagnosis of  By: Barbaraann Barthel MD, [place]     Renal disorder    Sees someone [facility]   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   ROTATOR CUFF SYNDROME, 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   Qualifier: Diagnosis of  By: Barbaraann Barthel MD, Benetta Spar     SUBACROMIAL BURSITIS, 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   Qualifier: Diagnosis of  By: Barbaraann Barthel MD, [place]     Wears glasses    Past Surgical History:  Procedure Laterality Date   25 GAUGE PARS PLANA VITRECTOMY WITH 20 GAUGE MVR PORT FOR MACULAR HOLE Right 06/14/2014   Procedure: 25 GAUGE PARS PLANA VITRECTOMY WITH 20 GAUGE MVR PORT FOR MACULAR HOLE; serum patch; gas injection; laser treament; C3F8 gas fluid exchange.;  Surgeon: Sherrie George, MD;  Location: [facility] OR;  Service: Ophthalmology;  Laterality: Right;   ABDOMINAL HYSTERECTOMY     EYE SURGERY     GANGLION CYST EXCISION Left 02/02/2015   Procedure: EXCISION OF VOLAR GANGLION CYST LEFT WRIST;  Surgeon: Betha Loa, MD;  Location: [facility];  Service: Orthopedics;  Laterality: Left;   GAS INSERTION Right 07/19/2014   Procedure: INSERTION OF GAS;  Surgeon: Sherrie George, MD;  Location: [facility] OR;  Service: Ophthalmology;  Laterality: Right;  C3F8   LASER PHOTO ABLATION Right 07/19/2014   Procedure: LASER PHOTO ABLATION;  Surgeon: Sherrie George, MD;  Location: [facility] OR;  Service: Ophthalmology;  Laterality: Right;   PARS PLANA VITRECTOMY W/ REPAIR OF MACULAR HOLE Right 06/14/2014   DR MATTHEWS   SCLERAL BUCKLE Right 07/19/2014   Procedure: SCLERAL BUCKLE;  Surgeon: Sherrie George, MD;  Location[facility] OR;  Service:  Ophthalmology;  Laterality: Right;     A IV Location/Drains/Wounds Patient Lines/Drains/Airways Status     Active Line/Drains/Airways     Name Placement date Placement time Site Days   Peripheral IV 06/14/23 20 G Anterior;Right Forearm 06/14/23  1334  Forearm  less than 1   Incision (Closed) 06/14/14 Eye Right 06/14/14  1226  -- 3287   Incision (Closed) 07/19/14 Eye Right 07/19/14  1516  -- 3252   Incision (Closed) 02/02/15 Hand Left 02/02/15  1000  -- 3054            Intake/Output Last 24 hours No intake or output data in the 24 hours ending 06/14/23 1512  Labs/Imaging Results for orders placed or performed during the hospital encounter of 06/14/23 (from the past 48 hour(s))  CBC     Status: Abnormal   Collection Time: 06/14/23 12:10 PM  Result Value Ref Range   WBC 5.3 4.0 - 10.5 K/uL   RBC 2.77 (L) 3.87 - 5.11 MIL/uL   Hemoglobin 6.5 (LL) 12.0 - 15.0 g/dL    Comment: REPEATED TO VERIFY THIS CRITICAL RESULT HAS VERIFIED AND BEEN CALLED TO SCARLET COOKE, RN BY [employer] ON 07 06 2024 AT 1232, AND HAS BEEN READ BACK.     HCT 22.7 (L) 36.0 - 46.0 %   MCV 81.9 80.0 - 100.0 fL   MCH 23.5 (L) 26.0 - 34.0 pg   MCHC 28.6 (L) 30.0 - 36.0 g/dL   RDW 30.8 (H) 65.7 - 84.6 %   Platelets 405 (H) 150 - 400 K/uL   nRBC 0.0 0.0 - 0.2 %    Comment: Performed at [facility], [address] [place], [place] [address]  Comprehensive metabolic panel     Status: Abnormal   Collection Time: 06/14/23 12:10 PM  Result Value Ref Range   Sodium 140 135 - 145 mmol/L   Potassium 3.9 3.5 - 5.1 mmol/L   Chloride 113 (H) 98 - 111 mmol/L   CO2 19 (L) 22 - 32 mmol/L   Glucose, Bld 91 70 - 99 mg/dL    Comment: Glucose reference range applies only to samples taken after fasting for at least 8 hours.   BUN 35 (H) 8 - 23 mg/dL   Creatinine, Ser 2.84 (H) 0.44 - 1.00 mg/dL   Calcium 8.6 (L) 8.9 - 10.3 mg/dL   Total Protein 6.0 (L) 6.5 - 8.1 g/dL   Albumin 3.3 (L) 3.5 - 5.0 g/dL    AST 12 (L) 15 - 41 U/L   ALT 11 0 - 44 U/L   Alkaline Phosphatase 58 38 - 126 U/L   Total Bilirubin 0.5 0.3 - 1.2 mg/dL   GFR, Estimated 13 (L) >60 mL/min    Comment: (NOTE) Calculated using the CKD-EPI Creatinine Equation (2021)    Anion gap 8 5 - 15  Comment: Performed at [facility], [address] [place], [place] [address]  Protime-INR     Status: None   Collection Time: 06/14/23 12:10 PM  Result Value Ref Range   Prothrombin Time 14.2 11.4 - 15.2 seconds   INR 1.1 0.8 - 1.2    Comment: (NOTE) INR goal varies based on device and disease states. Performed at [facility], [address] [place], [place] [address]   Type and screen [facility]     Status: None (Preliminary result)   Collection Time: 06/14/23 12:10 PM  Result Value Ref Range   ABO/RH(D) A POS    Antibody Screen NEG    Sample Expiration 06/17/2023,2359    Unit Number [id number]    Blood Component Type RED CELLS,LR    Unit division 00    Status of Unit ISSUED    Transfusion Status OK TO TRANSFUSE    Crossmatch Result      Compatible Performed at [facility], [address] [place], [place] [address]    Unit Number [id number]    Blood Component Type RBC LR PHER2    Unit division 00    Status of Unit ALLOCATED    Transfusion Status OK TO TRANSFUSE    Crossmatch Result Compatible   Vitamin B12     Status: None   Collection Time: 06/14/23  1:09 PM  Result Value Ref Range   Vitamin B-12 261 180 - 914 pg/mL    Comment: (NOTE) This assay is not validated for testing neonatal or myeloproliferative syndrome specimens for Vitamin B12 levels. Performed at [facility], [address] [place], [place] [address]   Iron and TIBC     Status: Abnormal   Collection Time: 06/14/23  1:09 PM  Result Value Ref Range   Iron 21 (L) 28 - 170 ug/dL   TIBC 875 643 - 329 ug/dL   Saturation Ratios 6 (L) 10.4 - 31.8 %   UIBC 326 ug/dL    Comment: Performed at [facility], [address], [place], [place] [address]  Ferritin     Status: Abnormal   Collection Time: 06/14/23  1:09 PM  Result Value Ref Range   Ferritin 7 (L) 11 - 307 ng/mL    Comment: Performed at [facility], [address], [place], [place] [address]  Reticulocytes     Status: Abnormal   Collection Time: 06/14/23  1:09 PM  Result Value Ref Range   Retic Ct Pct 2.0 0.4 - 3.1 %   RBC. 2.81 (L) 3.87 - 5.11 MIL/uL   Retic Count, Absolute 56.8 19.0 - 186.0 K/uL   Immature Retic Fract 10.4 2.3 - 15.9 %    Comment: Performed at [facility], [address], [place], [place] [address]  CBG monitoring, ED     Status: None   Collection Time: 06/14/23  1:23 PM  Result Value Ref Range   Glucose-Capillary 75 70 - 99 mg/dL    Comment: Glucose reference range applies only to samples taken after fasting for at least 8 hours.  Prepare RBC (crossmatch)     Status: None   Collection Time: 06/14/23  1:30 PM  Result Value Ref Range   Order Confirmation      ORDERS RECEIVED TO CROSSMATCH Performed at [facility], [address], [place], [place] [address]   POC occult blood, ED     Status: Abnormal  Collection Time: 06/14/23  2:48 PM  Result Value Ref Range   Fecal Occult Bld POSITIVE (A) NEGATIVE   No results found.  Pending Labs Unresulted Labs (From admission, onward)     Start     Ordered   06/15/23 0500  CBC  Tomorrow morning,   R        06/14/23 1450   06/15/23 0500  Basic metabolic panel  Tomorrow morning,   R        06/14/23 1450   06/14/23 1309  Folate  (Anemia Panel (PNL))  Once,   URGENT        06/14/23 1309            Vitals/Pain Today's Vitals   06/14/23 1338 06/14/23 1345 06/14/23 1400 06/14/23 1415  BP:  (!) 174/52 (!) 167/55 (!) 174/54  Pulse: (!) 45 (!) 47 (!) 47 (!) 49  Resp: 12 11 14 17   Temp:  98.6 F (37 C)  98.4 F (36.9 C)  TempSrc:  Oral  Oral  SpO2: 100% 100% 100% 99%  Height:      PainSc:  0-No pain      Isolation  Precautions No active isolations  Medications Medications  QUEtiapine (SEROQUEL) tablet 25 mg (has no administration in time range)  donepezil (ARICEPT) tablet 10 mg (has no administration in time range)  escitalopram (LEXAPRO) tablet 5 mg (has no administration in time range)  acetaminophen (TYLENOL) tablet 650 mg (has no administration in time range)    Or  acetaminophen (TYLENOL) suppository 650 mg (has no administration in time range)  polyethylene glycol (MIRALAX / GLYCOLAX) packet 17 g (has no administration in time range)  ondansetron (ZOFRAN) tablet 4 mg (has no administration in time range)    Or  ondansetron (ZOFRAN) injection 4 mg (has no administration in time range)  0.9 %  sodium chloride infusion (Manually program via Guardrails IV Fluids) ( Intravenous New Bag/Given 06/14/23 1358)    Mobility walks      R Recommendations: See Admitting Provider Note  Report given to: 5C10

## 2023-06-14 NOTE — ED Triage Notes (Signed)
Pt. Stated, sent from Dr's office due to HGB being 6.8 and Ive been really tired.

## 2023-06-14 NOTE — Hospital Course (Signed)
Megan Stevenson is a 82 y.o.female with a history of dementia, anemia, CKD 4, bradycardia, PUD, lacunar infarction, and prediabetes who was admitted to the [facility] Medicine teaching Service at [facility] for symptomatic anemia. Her hospital course is detailed below:  Symptomatic Anemia Patient presented to the ED with chronic fatigue and hemoglobin of 6.5 with iron deficiency.  Per family, patient has had a lack of appetite. Transfused with 2 units of RBCs in the ED, improved Hgb to 8.6.  Due to her dementia, goals of care conversation was had with granddaughter.  I was consulted and plans to perform an EGD to rule out GI bleed.  EGD showed a small hiatal hernia, but otherwise normal findings without ulceration in stomach, duodenum.  Recommendation per GI was to follow-up as needed outpatient, and IV iron periodically for consideration of hematology consult.  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  CKD Stage 4 Creatinine elevated from baseline of around 2.5-3 upon admission. Nephrotoxic agents were avoided.  Other chronic conditions were medically managed with home medications and formulary alternatives as necessary (***)  PCP Follow-up Recommendations:  Consider hematology consult for iron deficiency anemia, and treatment with IV iron infusions outpatient

## 2023-06-14 NOTE — Progress Notes (Signed)
FMTS Brief Progress Note  S: To bedside for night rounds, pt asleep comfortably, did not awaken. Spoke with RN on the floor who reported her blood transfusion was completed at 1945   O: BP (!) 146/48 (BP Location: Right Arm)   Pulse (!) 52   Temp 98.2 F (36.8 C) (Oral)   Resp 16   Ht 5\' 7"  (1.702 m)   SpO2 100%   BMI 22.62 kg/m    NAD, asleep comfortably, not in any respiratory distress   A/P:  Anemia s/p 2u pRBC -F/u post H/H  Remainder per today's H&P  - Orders reviewed. Labs for AM ordered, which was adjusted as needed.  - If condition changes, plan includes page primary team.   Vonna Drafts, MD 06/14/2023, 10:07 PM PGY-2, [facility] Night Resident  Please page [phone] with questions.

## 2023-06-14 NOTE — ED Triage Notes (Signed)
Hgb 6.5

## 2023-06-14 NOTE — Telephone Encounter (Signed)
Called patient's sister [name] who verified patient information before discussion proceeded. [name] is patient's sister and care giver who was present during patient's clinic visit yesterday. Informed [name] patient lab came back and showed very low Hemoglobin and I advised she take patient to ED for transfusion, she would likely need more work up. [name] verbalized understanding and plans to take patient to the ED after meal.

## 2023-06-14 NOTE — Assessment & Plan Note (Signed)
Creatinine elevated from baseline of around 2.5-3.  Will recheck BMP tomorrow morning.  - Avoid nephrotoxic agents

## 2023-06-14 NOTE — ED Provider Notes (Signed)
[place] EMERGENCY DEPARTMENT AT [facility] Provider Note   CSN: [id number] Arrival date & time: 06/14/23  1058     History  Chief Complaint  Patient presents with   Abnormal Lab   Fatigue    Megan Stevenson is a 82 y.o. female.   Abnormal Lab    Patient presents to the ED for evaluation of low hemoglobin.  Patient was seen at the doctor's office yesterday for evaluation of fatigue and poor p.o. intake.  Patient had been feeling generally weak.  She did not report any blood in her stool.  No vomiting.  No fevers or chills.  Patient had laboratory test that showed her hemoglobin was decreased.  She was told to come to the ED for further evaluation.  Home Medications Prior to Admission medications   Medication Sig Start Date End Date Taking? Authorizing Provider  allopurinol (ZYLOPRIM) 100 MG tablet Take 100 mg by mouth daily. 04/12/22   [provider]  amLODipine (NORVASC) 10 MG tablet TAKE 1 TABLET(10 MG) BY MOUTH DAILY 05/09/23   Maury Dus, MD  atorvastatin (LIPITOR) 10 MG tablet TAKE 1 TABLET(10 MG) BY MOUTH AT BEDTIME 02/03/23   Maury Dus, MD  cloNIDine (CATAPRES) 0.2 MG tablet Take 1 tablet (0.2 mg total) by mouth daily. 10/30/21   Maury Dus, MD  diclofenac Sodium (VOLTAREN) 1 % GEL Apply to right knee 2-3 times daily as needed for pain 08/07/21   Maury Dus, MD  donepezil (ARICEPT) 10 MG tablet TAKE 1 TABLET(10 MG) BY MOUTH DAILY 10/17/22   Fayette Pho, MD  escitalopram (LEXAPRO) 5 MG tablet Take 1 tablet (5 mg total) by mouth daily. 04/04/23   Littie Deeds, MD  FEROSUL 325 (65 Fe) MG tablet TAKE 1 TABLET(325 MG) BY MOUTH DAILY 04/24/23   Maury Dus, MD  hydrALAZINE (APRESOLINE) 25 MG tablet Take 25 mg by mouth 2 (two) times daily. 01/31/23   [provider]  Nutritional Supplements (ENSURE ACTIVE HIGH PROTEIN) LIQD Take 1 Box by mouth 2 (two) times daily. 06/13/23   Jerre Simon, MD  QUEtiapine (SEROQUEL) 25 MG tablet TAKE  1 TABLET(25 MG) BY MOUTH AT BEDTIME 05/16/23   Maury Dus, MD      Allergies    Nsaids, Benazepril hcl, Furosemide, and Atenolol    Review of Systems   Review of Systems  Physical Exam Updated Vital Signs BP (!) 174/54   Pulse (!) 49   Temp 98.4 F (36.9 C) (Oral)   Resp 17   Ht 1.702 m (5\' 7" )   SpO2 99%   BMI 22.62 kg/m  Physical Exam Vitals and nursing note reviewed.  Constitutional:      General: She is not in acute distress.    Appearance: She is well-developed.  HENT:     Head: Normocephalic and atraumatic.     Right Ear: External ear normal.     Left Ear: External ear normal.  Eyes:     General: No scleral icterus.       Right eye: No discharge.        Left eye: No discharge.     Conjunctiva/sclera: Conjunctivae normal.  Neck:     Trachea: No tracheal deviation.  Cardiovascular:     Rate and Rhythm: Normal rate and regular rhythm.  Pulmonary:     Effort: Pulmonary effort is normal. No respiratory distress.     Breath sounds: Normal breath sounds. No stridor. No wheezing or rales.  Abdominal:  General: Bowel sounds are normal. There is no distension.     Palpations: Abdomen is soft.     Tenderness: There is no abdominal tenderness. There is no guarding or rebound.  Genitourinary:    Comments: Dark-colored stool although not melanotic Musculoskeletal:        General: No tenderness or deformity.     Cervical back: Neck supple.  Skin:    General: Skin is warm and dry.     Coloration: Skin is pale.     Findings: No rash.  Neurological:     General: No focal deficit present.     Mental Status: She is alert.     Cranial Nerves: No cranial nerve deficit, dysarthria or facial asymmetry.     Sensory: No sensory deficit.     Motor: No abnormal muscle tone or seizure activity.     Coordination: Coordination normal.  Psychiatric:        Mood and Affect: Mood normal.     ED Results / Procedures / Treatments   Labs (all labs ordered are listed, but  only abnormal results are displayed) Labs Reviewed  CBC - Abnormal; Notable for the following components:      Result Value   RBC 2.77 (*)    Hemoglobin 6.5 (*)    HCT 22.7 (*)    MCH 23.5 (*)    MCHC 28.6 (*)    RDW 15.8 (*)    Platelets 405 (*)    All other components within normal limits  COMPREHENSIVE METABOLIC PANEL - Abnormal; Notable for the following components:   Chloride 113 (*)    CO2 19 (*)    BUN 35 (*)    Creatinine, Ser 3.35 (*)    Calcium 8.6 (*)    Total Protein 6.0 (*)    Albumin 3.3 (*)    AST 12 (*)    GFR, Estimated 13 (*)    All other components within normal limits  RETICULOCYTES - Abnormal; Notable for the following components:   RBC. 2.81 (*)    All other components within normal limits  PROTIME-INR  VITAMIN B12  FOLATE  IRON AND TIBC  FERRITIN  CBG MONITORING, ED  POC OCCULT BLOOD, ED  TYPE AND SCREEN  PREPARE RBC (CROSSMATCH)    EKG None  Radiology No results found.  Procedures .Critical Care  Performed by: Linwood Dibbles, MD Authorized by: Linwood Dibbles, MD   Critical care provider statement:    Critical care time (minutes):  45   Critical care was time spent personally by me on the following activities:  Development of treatment plan with patient or surrogate, discussions with consultants, evaluation of patient's response to treatment, examination of patient, ordering and review of laboratory studies, ordering and review of radiographic studies, ordering and performing treatments and interventions, pulse oximetry, re-evaluation of patient's condition and review of old charts     Medications Ordered in ED Medications  0.9 %  sodium chloride infusion (Manually program via Guardrails IV Fluids) ( Intravenous New Bag/Given 06/14/23 1358)    ED Course/ Medical Decision Making/ A&P Clinical Course as of 06/14/23 1440  Sat Jun 14, 2023  1438 Case discussed with family medicine service.  They will admit the patient to the hospital [JK]     Clinical Course User Index [JK] Linwood Dibbles, MD                             Medical Decision  Making Problems Addressed: Symptomatic anemia: acute illness or injury that poses a threat to life or bodily functions  Amount and/or Complexity of Data Reviewed Labs: ordered.    Details: Labs notable for decreased hemoglobin.  Similar to previous.  Patient also has chronic kidney disease which appears stable  Risk Prescription drug management.   Patient presents ED for evaluation of new onset anemia.  No history of blood in her stool but GI bleed is still a consideration.  Hemoccult has been performed.  I have ordered 2 units of blood.  With the patient's chronic kidney disease she will need to be monitored closely.  EMEA panel sent off.  I have consulted with the family medicine service for admission and further treatment        Final Clinical Impression(s) / ED Diagnoses Final diagnoses:  Symptomatic anemia    Rx / DC Orders ED Discharge Orders     None         Linwood Dibbles, MD 06/14/23 1440

## 2023-06-14 NOTE — Plan of Care (Signed)

## 2023-06-14 NOTE — Plan of Care (Signed)
Patient alert/oriented X4. Patient compliant with medication administration and tolerated IV blood transfusion. BP slightly elevated on admission and HR in 50's. MD aware. VSS, will continue to monitor.

## 2023-06-14 NOTE — ED Provider Triage Note (Signed)
Emergency Medicine Provider Triage Evaluation Note  Megan Stevenson , a 82 y.o. female  was evaluated in triage.  Pt complains of anemia.  She has had fatigue for the last few months.  Presented to her primary care provider for physical yesterday.  Was found to have a hemoglobin of 6.5.  She denies any melena or hematochezia.  No vaginal bleeding.  She denies any shortness of breath or chest pain.  No fevers or chills.  She is not anticoagulated.  Review of Systems  Positive: As above Negative: As above  Physical Exam  BP (!) 138/59   Pulse (!) 48   Temp 98 F (36.7 C) (Oral)   Resp 16   SpO2 99%  Gen:   Awake, no distress   Resp:  Normal effort  MSK:   Moves extremities without difficulty  Other:  Pale skin  Medical Decision Making  Medically screening exam initiated at 12:01 PM.  Appropriate orders placed.  Megan Stevenson was informed that the remainder of the evaluation will be completed by another provider, this initial triage assessment does not replace that evaluation, and the importance of remaining in the ED until their evaluation is complete.  Workup initiated   Michelle Piper, Cordelia Poche 06/14/23 1202

## 2023-06-14 NOTE — H&P (Signed)
Hospital Admission History and Physical Service Pager: [phone]  Patient name: Megan Stevenson Medical record number: [id number] Date of Birth: [birth year] Age: 82 y.o. Gender: female  Primary Care Provider: Jerre Simon, MD Consultants: None Code Status: DNR Preferred Emergency Contact:  Contact Information     Name Relation Home Work [place] Sister [phone]  [phone]   Lynch,Brittany Granddaughter [phone]         Chief Complaint: Fatigue, Anemia   Assessment and Plan: Megan Stevenson is a 82 y.o. female presenting with chronic fatigue and hgb 6.5 with apparent iron deficiency. Differential for presentation of this includes gastric ulcer, malignancy, iron deficiency anemia, anemia of chronic disease 2/2 to CKD4.   Patient has documented history of dementia.  On my exam she is appropriate and appears to answer all questions.  Back in March 2023, patient had a low hemoglobin which was transfused without GI workup.  Will need to have discussion with patient and family on goals of care this admission.  If patient is not wanting to pursue colonoscopy/EGD, will monitor hemoglobin after transfusion and discharge home with PPI and iron supplementation.  Hospital Problem List      Hospital     * (Principal) Anemia     Hgb 6.5; 2 units RBC ordered in the ED. VS stable.  - Admit to FMTS, attending Dr. Deirdre Priest - med-surg, Vital signs per floor - Regular diet  - PT/OT to treat - VTE prophylaxis: SCDs, high risk bleeding  - AM CBC/BMP  - Fall precautions - Delirium precautions - Protonix 40 mg BID  - Avoid NSAIDs  - Consider GI consult in the AM         CKD (chronic kidney disease) stage 4, GFR 15-29 ml/min (HCC)     Creatinine elevated from baseline of around 2.5-3.  Will recheck BMP  tomorrow morning.  - Avoid nephrotoxic agents      Chronic and stable conditions: Dementia: Continue Seroquel, donepezil 10 mg, Lexapro 5 mg Hyperlipidemia:  Continue Lipitor 10 mg Hypertension: Continue amlodipine 10 mg, clonidine 0.2 mg daily, hydralazine 25 mg twice daily  FEN/GI: Regular diet  VTE Prophylaxis: SCDs  Disposition: med-surg  History of Present Illness:  Megan Stevenson is a 82 y.o. female presenting with chronic fatigue from family medicine office.  Blood work was drawn which showed anemia and hemoglobin of 6.5.  She reports having worsening fatigue over the past several months.  She denies GI related symptoms such as GERD, abdominal pain, melena, hematochezia.  She does not drink alcohol and avoids NSAIDs.  She reports taking all of her medications and managing those at home by herself.  She is able to do a med rec.  She denies chest pain, shortness of breath, palpitations.   In the ED, vital signs are stable.  Labs are significant for: iron studies: iron 21, ferritin 7, B12 261, FOB positive, hemoglobin 6.5.  She was transfused 2 units RBCs and FMTS called for admission for management of anemia.   Review Of Systems: Per HPI with the following additions: as above   Pertinent Past Medical History: Anemia, CKD 4, gout, bradycardia, h/o lacunar infarction, prediabetes, PUD, colon polyps Remainder reviewed in history tab.   Pertinent Past Surgical History: Hysterectomy Remainder reviewed in history tab.   Pertinent Social History: Tobacco use: Denies Alcohol use: denies  Other Substance use: denies  Lives with son  Pertinent Family History: Remainder reviewed in history tab.   Important  Outpatient Medications: Allopurinol 100 mg Amlodipine 10 mg Lipitor 10 mg Clonidine 0.2 mg daily Iron supplementation daily Hydralazine 25 mg twice daily Ensure 2 times daily Donepezil 10 mg daily Lexapro 5 mg daily Seroquel 25 mg daily Remainder reviewed in medication history.   Objective: BP (!) 186/65 (BP Location: Left Arm)   Pulse 61   Temp 98 F (36.7 C) (Oral)   Resp 16   Ht 5\' 7"  (1.702 m)   SpO2 98%   BMI 22.62  kg/m  Exam: Frail-appearing, no acute distress Cardio: Regular rate, regular rhythm, no murmurs on exam. Pulm: Clear, no wheezing, no crackles. No increased work of breathing Abdominal: bowel sounds present, soft, non-tender, non-distended Extremities: no peripheral edema  Neuro: alert and oriented x3, speech normal in content, no facial asymmetry, strength intact and equal bilaterally in UE and LE, pupils equal and reactive to light.  Psych:  Cognition and judgment appear intact. Alert, communicative  and cooperative with normal attention span and concentration. No apparent delusions, illusions, hallucinations   Labs:  CBC BMET  Recent Labs  Lab 06/14/23 1210  WBC 5.3  HGB 6.5*  HCT 22.7*  PLT 405*   Recent Labs  Lab 06/14/23 1210  NA 140  K 3.9  CL 113*  CO2 19*  BUN 35*  CREATININE 3.35*  GLUCOSE 91  CALCIUM 8.6*    Pertinent additional labs PT 14.2, INR 1.1, iron 21, ferritin 7, creatinine 3.35.   EKG: Sinus bradycardia, normal QTc, no ST elevations or depressions   Glendale Chard, DO 06/14/2023, 4:23 PM PGY-2, [facility]  FPTS Intern pager: [phone], text pages welcome Secure chat group [facility] Teaching Service

## 2023-06-15 DIAGNOSIS — D509 Iron deficiency anemia, unspecified: Secondary | ICD-10-CM | POA: Diagnosis not present

## 2023-06-15 DIAGNOSIS — R195 Other fecal abnormalities: Secondary | ICD-10-CM | POA: Diagnosis not present

## 2023-06-15 DIAGNOSIS — D649 Anemia, unspecified: Secondary | ICD-10-CM | POA: Diagnosis not present

## 2023-06-15 LAB — BASIC METABOLIC PANEL
Anion gap: 7 (ref 5–15)
BUN: 32 mg/dL — ABNORMAL HIGH (ref 8–23)
CO2: 20 mmol/L — ABNORMAL LOW (ref 22–32)
Calcium: 8.6 mg/dL — ABNORMAL LOW (ref 8.9–10.3)
Chloride: 115 mmol/L — ABNORMAL HIGH (ref 98–111)
Creatinine, Ser: 3.02 mg/dL — ABNORMAL HIGH (ref 0.44–1.00)
GFR, Estimated: 15 mL/min — ABNORMAL LOW (ref >60)
Glucose, Bld: 93 mg/dL (ref 70–99)
Potassium: 4.2 mmol/L (ref 3.5–5.1)
Sodium: 142 mmol/L (ref 135–145)

## 2023-06-15 LAB — TYPE AND SCREEN
ABO/RH(D): A POS
Antibody Screen: NEGATIVE
Unit division: 0
Unit division: 0

## 2023-06-15 LAB — BPAM RBC
Blood Product Expiration Date: 202407132359
ISSUE DATE / TIME: 202407061338

## 2023-06-15 LAB — CBC
HCT: 27.4 % — ABNORMAL LOW (ref 36.0–46.0)
Hemoglobin: 8.6 g/dL — ABNORMAL LOW (ref 12.0–15.0)
MCH: 25.3 pg — ABNORMAL LOW (ref 26.0–34.0)
MCHC: 31.4 g/dL (ref 30.0–36.0)
MCV: 80.6 fL (ref 80.0–100.0)
Platelets: 341 10*3/uL (ref 150–400)
RBC: 3.4 MIL/uL — ABNORMAL LOW (ref 3.87–5.11)
RDW: 15.6 % — ABNORMAL HIGH (ref 11.5–15.5)
WBC: 6 10*3/uL (ref 4.0–10.5)
nRBC: 0 % (ref 0.0–0.2)

## 2023-06-15 NOTE — Plan of Care (Signed)

## 2023-06-15 NOTE — Progress Notes (Addendum)
     Daily Progress Note Intern Pager: [phone]  Patient name: Megan Stevenson Medical record number: [id number] Date of birth: 07-17-1941 Age: 82 y.o. Gender: female  Primary Care Provider: Jerre Simon, MD Consultants: None Code Status: DNR  Pt Overview and Major Events to Date:  7/6: Admitted, transfused with 2 units  Assessment and Plan: Megan Stevenson is a 82 y.o. female presenting with chronic fatigue and Hgb of 6.5 with apparent iron deficiency. Past medical history pertinent for dementia, anemia, CKD 4, bradycardia, PUD, lacunar infarction, and prediabetes.  Hospital Problem List      Hospital     * (Principal) Symptomatic anemia     Hgb improved to 8.6 from 6.5 after getting 2 units of RBC in the ED  6.5; 2 units RBC ordered in the ED. VS stable.  - PT/OT to treat - AM CBC - Continue Protonix 40 mg BID  - Avoid NSAIDs  - Consider IV Fe infusion - Consider GI consult for potential EGD/colonoscopy upon goals of care  discussion with patient and family        CKD (chronic kidney disease) stage 4, GFR 15-29 ml/min (HCC)     Creatinine still slightly elevated from baseline of around 2.5-3. - Avoid nephrotoxic agents      FEN/GI: Regular diet PPx: SCDs Dispo: home pending clinical improvement  Subjective:  No issues overnight.  Patient is doing well this morning.  Was just waking up from sleep.  No complaints and feels like she is doing better compared to yesterday.  Objective: Temp:  [98 F (36.7 C)-98.6 F (37 C)] 98.4 F (36.9 C) (07/07 0748) Pulse Rate:  [44-61] 47 (07/07 0748) Resp:  [9-18] 17 (07/07 0748) BP: (120-186)/(48-131) 146/53 (07/07 0748) SpO2:  [98 %-100 %] 100 % (07/07 0748)  Physical Exam: General: NAD, laying comfortably supine in bed, waking up from sleep Cardiovascular: RRR. No M/R/G Respiratory: CTAB, normal WOB on RA, no wheezing, no crackles Abdomen: soft, nontender, nondistended, bowel sounds present Extremities: no BLE  edema Neuro: A&Ox2 (name, location, but not month/year). No focal neurological deficits.   Laboratory: Most recent CBC Lab Results  Component Value Date   WBC 6.0 06/15/2023   HGB 8.6 (L) 06/15/2023   HCT 27.4 (L) 06/15/2023   MCV 80.6 06/15/2023   PLT 341 06/15/2023   Most recent BMP    Latest Ref Rng & Units 06/15/2023    1:33 AM  BMP  Glucose 70 - 99 mg/dL 93   BUN 8 - 23 mg/dL 32   Creatinine 8.65 - 1.00 mg/dL 7.84   Sodium 696 - 295 mmol/L 142   Potassium 3.5 - 5.1 mmol/L 4.2   Chloride 98 - 111 mmol/L 115   CO2 22 - 32 mmol/L 20   Calcium 8.9 - 10.3 mg/dL 8.6     Imaging/Diagnostic Tests: No new imaging.   Fortunato Curling, DO 06/15/2023, 12:46 PM PGY-1, [facility]  FPTS Intern pager: [phone], text pages welcome Secure chat group [facility] Teaching Service

## 2023-06-15 NOTE — Assessment & Plan Note (Addendum)
Hgb improved to 8.6 from 6.5 after getting 2 units of RBC in the ED 6.5; 2 units RBC ordered in the ED. VS stable.  - PT/OT to treat - AM CBC - Continue Protonix 40 mg BID  - Avoid NSAIDs  - Consider GI consult upon goals of care discussion with patient and family

## 2023-06-15 NOTE — Consult Note (Signed)
Referring Provider: Dr. Deirdre Priest Primary Care Physician:  Jerre Simon, MD Primary Gastroenterologist:  Dr. Bosie Clos  Reason for Consultation:  Anemia; Heme positive stool  HPI: Megan Stevenson is a 82 y.o. female with multiple medical problems as stated below presented to ER with weakness and poor PO intake and was found to have severe anemia by her PCP and sent to hospital. Patient has dementia and does not know any history about her weakness. She knows she is in [facility] hospital and knows her name and date of birth but does not know why she is in the hospital and does not know what year it is. Chart review states no history of rectal bleeding, melena, N/V, or abdominal pain. Spoke to her HCPOA [name] (granddaughter) and neither she nor her family have seen any bleeding. EGD/colon in 2014 showed H. Pylori gastritis and a nonbleeding cecal AVM, sigmoid diverticulosis, and small internal hemorrhoids. Admitted in 2023 for anemia and f/u with [facility] planned but did not show for her appointment.  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   Qualifier: Diagnosis of  By: Barbaraann Barthel MD, [place]     Hypertension    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   Brain MRI 06/13/2018: Small deep white matter and basal ganglia lacunar infarcts   Macular hole    OD   Macular hole, 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   Metatarsal fracture, Right foot 08/25/2020   Need for immunization against influenza 10/18/2022   Osteoarthritis of right knee 04/17/2021    Prediabetes    PUD 07/24/2007   Annotation: 10/97 Qualifier: Diagnosis of  By: Barbaraann Barthel MD, [place]     Renal disorder    Sees someone [facility]   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   ROTATOR CUFF SYNDROME, 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   Qualifier: Diagnosis of  By: Barbaraann Barthel MD, Benetta Spar     SUBACROMIAL BURSITIS, 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   Qualifier: Diagnosis of  By: Barbaraann Barthel MD, [place]     Wears glasses     Past Surgical History:  Procedure Laterality Date   25 GAUGE PARS PLANA VITRECTOMY WITH 20 GAUGE MVR PORT FOR MACULAR HOLE Right 06/14/2014   Procedure: 25 GAUGE PARS PLANA VITRECTOMY WITH 20 GAUGE MVR PORT FOR MACULAR HOLE; serum patch; gas injection; laser treament; C3F8 gas fluid exchange.;  Surgeon: Sherrie George, MD;  Location: [facility] OR;  Service: Ophthalmology;  Laterality: Right;   ABDOMINAL HYSTERECTOMY     EYE SURGERY     GANGLION CYST EXCISION Left 02/02/2015   Procedure: EXCISION OF VOLAR GANGLION CYST LEFT WRIST;  Surgeon: Betha Loa, MD;  Location: [facility];  Service: Orthopedics;  Laterality: Left;   GAS INSERTION Right 07/19/2014   Procedure: INSERTION OF GAS;  Surgeon: Sherrie George, MD;  Location: [facility] OR;  Service: Ophthalmology;  Laterality: Right;  C3F8   LASER PHOTO ABLATION Right 07/19/2014   Procedure: LASER PHOTO ABLATION;  Surgeon: Sherrie George, MD;  Location: [facility] OR;  Service: Ophthalmology;  Laterality: Right;   PARS PLANA VITRECTOMY W/ REPAIR OF MACULAR HOLE Right 06/14/2014   DR MATTHEWS   SCLERAL BUCKLE Right 07/19/2014   Procedure: SCLERAL BUCKLE;  Surgeon: Sherrie George, MD;  Location[facility] OR;  Service: Ophthalmology;  Laterality: Right;    Prior to Admission medications   Medication Sig Start Date End Date Taking? Authorizing Provider  amLODipine (NORVASC) 10 MG tablet TAKE 1 TABLET(10 MG) BY MOUTH DAILY 05/09/23  Yes Maury Dus, MD  atorvastatin (LIPITOR) 10 MG tablet TAKE 1 TABLET(10 MG) BY MOUTH AT BEDTIME 02/03/23    Maury Dus, MD  cloNIDine (CATAPRES) 0.2 MG tablet Take 1 tablet (0.2 mg total) by mouth daily. 10/30/21   Maury Dus, MD  diclofenac Sodium (VOLTAREN) 1 % GEL Apply to right knee 2-3 times daily as needed for pain 08/07/21   Maury Dus, MD  donepezil (ARICEPT) 10 MG tablet TAKE 1 TABLET(10 MG) BY MOUTH DAILY 10/17/22   Fayette Pho, MD  escitalopram (LEXAPRO) 5 MG tablet Take 1 tablet (5 mg total) by mouth daily. 04/04/23   Littie Deeds, MD  FEROSUL 325 (65 Fe) MG tablet TAKE 1 TABLET(325 MG) BY MOUTH DAILY 04/24/23   Maury Dus, MD  hydrALAZINE (APRESOLINE) 25 MG tablet Take 25 mg by mouth 2 (two) times daily. 01/31/23   [provider]  Nutritional Supplements (ENSURE ACTIVE HIGH PROTEIN) LIQD Take 1 Box by mouth 2 (two) times daily. 06/13/23   Jerre Simon, MD  QUEtiapine (SEROQUEL) 25 MG tablet TAKE 1 TABLET(25 MG) BY MOUTH AT BEDTIME 05/16/23   Maury Dus, MD    Scheduled Meds:  amLODipine  10 mg Oral Daily   atorvastatin  10 mg Oral Daily   cloNIDine  0.2 mg Oral Daily   diclofenac Sodium  4 g Topical QID   donepezil  10 mg Oral QHS   escitalopram  5 mg Oral Daily   hydrALAZINE  25 mg Oral BID   pantoprazole  40 mg Oral BID   QUEtiapine  25 mg Oral QHS   Continuous Infusions: PRN Meds:.acetaminophen **OR** acetaminophen, ondansetron **OR** ondansetron (ZOFRAN) IV, polyethylene glycol  Allergies as of 06/14/2023 - Review Complete 06/14/2023  Allergen Reaction Noted   Nsaids Other (See Comments) 09/23/2008   Benazepril hcl Other (See Comments)    Furosemide Other (See Comments)    Atenolol Other (See Comments)     Family History  Problem Relation Age of Onset   Healthy Mother    Healthy Father    Hypertension Sister     Social History   Socioeconomic History   Marital status: Divorced    Spouse name: Not on file   Number of children: 1   Years of education: 9   Highest education level: 9th grade  Occupational History   Not on file   Tobacco Use   Smoking status: Former    Packs/day: 0.25    Years: 3.00    Additional pack years: 0.00    Total pack years: 0.75    Types: Cigarettes    Quit date: 12/09/1993    Years since quitting: 29.5   Smokeless tobacco: Never  Vaping Use   Vaping Use: Never used  Substance and Sexual Activity   Alcohol use: No   Drug use: No   Sexual activity: Not Currently  Other Topics Concern   Not on file  Social History Narrative   Social Information ( 06/07/21)   patient lives with husband who is blind and step  son  . Patient enjoys watching TV and spends most of her time in bed . Primary family support person is sister [name] who lives near by, support is limited as sister also has health concerns and caring for her minor grandchildren.    Transportation to appointments provided by sister [name];        Basic Activities of Daily Living per sister Christianne Borrow   Dressing:Self-care   Eating: Self-care   Ambulation: Partial assistance ( uses a walker or cane)   Toileting: Self-care   Bathing: Self-care       Instrumental Activities of Daily Living   Shopping: Total assistance   House/Yard Work: Midwife of medications: Partial assistance   Finances: Total assistance   Telephone: Partial assistance   Transportation: Total assistance   Social Determinants of Health   Financial Resource Strain: Low Risk  (05/19/2023)   Overall Financial Resource Strain (CARDIA)    Difficulty of Paying Living Expenses: Not hard at all  Food Insecurity: No Food Insecurity (06/14/2023)   Hunger Vital Sign    Worried About Running Out of Food in the Last Year: Never true    Ran Out of Food in the Last Year: Never true  Transportation Needs: No Transportation Needs (06/14/2023)   PRAPARE - Administrator, Civil Service (Medical): No    Lack of Transportation (Non-Medical): No  Physical Activity: Inactive (05/19/2023)   Exercise Vital Sign    Days of Exercise per Week:  0 days    Minutes of Exercise per Session: 0 min  Stress: No Stress Concern Present (05/19/2023)   [employer] of Occupational Health - Occupational Stress Questionnaire    Feeling of Stress : Only a little  Social Connections: Moderately Isolated (05/19/2023)   Social Connection and Isolation Panel [NHANES]    Frequency of Communication with Friends and Family: More than three times a week    Frequency of Social Gatherings with Friends and Family: Three times a week    Attends Religious Services: More than 4 times per year    Active Member of Clubs or Organizations: No    Attends Banker Meetings: Never    Marital Status: Widowed  Intimate Partner Violence: Not At Risk (06/14/2023)   Humiliation, Afraid, Rape, and Kick questionnaire    Fear of Current or Ex-Partner: No    Emotionally Abused: No    Physically Abused: No    Sexually Abused: No    Review of Systems: All negative except as stated above in HPI.  Physical Exam: Vital signs: Vitals:   06/15/23 0430 06/15/23 0748  BP: (!) 141/59 (!) 146/53  Pulse: (!) 51 (!) 47  Resp: 16 17  Temp: 98.3 F (36.8 C) 98.4 F (36.9 C)  SpO2: 100% 100%   Last BM Date : 06/13/23 General:  lethargic, elderly, thin, no acute distress, demented Head: normocephalic, atraumatic Eyes: anicteric sclera ENT: oropharynx clear Neck: supple, nontender Lungs:  Clear throughout to auscultation.   No wheezes, crackles, or rhonchi. No acute distress. Heart:  Regular rate and rhythm; no murmurs, clicks, rubs,  or gallops. Abdomen: soft, nontender, nondistended, +BS  Rectal:  Deferred Ext: no edema  GI:  Lab Results: Recent Labs    06/13/23 1649 06/14/23 1210 06/15/23 0133  WBC 5.6 5.3 6.0  HGB 6.5* 6.5* 8.6*  HCT 22.4* 22.7* 27.4*  PLT 454* 405* 341   BMET Recent Labs    06/13/23 1649 06/14/23 1210 06/15/23 0133  NA  142 140 142  K 4.2 3.9 4.2  CL 109* 113* 115*  CO2 18* 19* 20*  GLUCOSE 95 91 93  BUN 34* 35*  32*  CREATININE 3.08* 3.35* 3.02*  CALCIUM 9.2 8.6* 8.6*   LFT Recent Labs    06/14/23 1210  PROT 6.0*  ALBUMIN 3.3*  AST 12*  ALT 11  ALKPHOS 58  BILITOT 0.5   PT/INR Recent Labs    06/14/23 1210  LABPROT 14.2  INR 1.1     Studies/Results: No results found.  Impression/Plan: 82 yo with symptomatic anemia and heme positive stool without any overt bleeding. EGD to evaluate for source of anemia. I do not think she would be able to drink a colon prep due to her dementia. Discussed risks/benefits of EGD with her granddaughter who is agreeable to her having this procedure. Clear liquid diet. NPO p MN. EGD tomorrow by Dr. Ewing Schlein at a time to be determined.     LOS: 0 days   Shirley Friar  06/15/2023, 4:27 PM  Questions please call [phone]

## 2023-06-15 NOTE — Assessment & Plan Note (Addendum)
Creatinine still slightly elevated from baseline of around 2.5-3. - Avoid nephrotoxic agents

## 2023-06-15 NOTE — Assessment & Plan Note (Signed)
Hgb improved to 8.6 from 6.5 after getting 2 units of RBC in the ED 6.5; 2 units RBC ordered in the ED. VS stable.  - PT/OT to treat - AM CBC - Continue Protonix 40 mg BID  - Avoid NSAIDs  - Consider IV Fe infusion - Consider GI consult for potential EGD/colonoscopy upon goals of care discussion with patient and family

## 2023-06-15 NOTE — Progress Notes (Addendum)
In attempt to have a goals of care discussion, both [name] Lynch (MPOA/granddaughter) and Meda Coffee (sister) were contacted over the phone due to patient's history of dementia. After discussing patient's current clinical stability and possible management options including conservative management (IV iron and PPI mgmt) vs more invasive management (EGD/Colonoscopy), her granddaughter would prefer that the patient be evaluated by GI because she believes she was lost to follow-up with GI after her last admission in March 2023. Consulted GI (Dr. Bosie Clos) after having the conversation with family and he will be seeing the patient to evaluate and provide further recommendations regarding her care.   Fortunato Curling, DO [facility], PGY-1 06/15/23 3:06 PM  Service pager [phone]

## 2023-06-16 ENCOUNTER — Telehealth: Payer: Self-pay | Admitting: Student

## 2023-06-16 ENCOUNTER — Observation Stay (HOSPITAL_BASED_OUTPATIENT_CLINIC_OR_DEPARTMENT_OTHER): Payer: Medicare Other | Admitting: Certified Registered Nurse Anesthetist

## 2023-06-16 ENCOUNTER — Encounter (HOSPITAL_COMMUNITY): Payer: Self-pay | Admitting: Family Medicine

## 2023-06-16 ENCOUNTER — Observation Stay (HOSPITAL_COMMUNITY): Payer: Medicare Other | Admitting: Certified Registered Nurse Anesthetist

## 2023-06-16 ENCOUNTER — Encounter (HOSPITAL_COMMUNITY)
Admission: EM | Disposition: A | Discharge status: Home / Self Care | Payer: Self-pay | Source: Home / Self Care | Attending: Emergency Medicine

## 2023-06-16 DIAGNOSIS — K449 Diaphragmatic hernia without obstruction or gangrene: Secondary | ICD-10-CM | POA: Diagnosis not present

## 2023-06-16 DIAGNOSIS — N184 Chronic kidney disease, stage 4 (severe): Secondary | ICD-10-CM | POA: Diagnosis not present

## 2023-06-16 DIAGNOSIS — Z87891 Personal history of nicotine dependence: Secondary | ICD-10-CM | POA: Diagnosis not present

## 2023-06-16 DIAGNOSIS — R195 Other fecal abnormalities: Secondary | ICD-10-CM | POA: Diagnosis not present

## 2023-06-16 DIAGNOSIS — I129 Hypertensive chronic kidney disease with stage 1 through stage 4 chronic kidney disease, or unspecified chronic kidney disease: Secondary | ICD-10-CM | POA: Diagnosis not present

## 2023-06-16 DIAGNOSIS — D649 Anemia, unspecified: Secondary | ICD-10-CM | POA: Diagnosis not present

## 2023-06-16 DIAGNOSIS — E785 Hyperlipidemia, unspecified: Secondary | ICD-10-CM | POA: Diagnosis not present

## 2023-06-16 DIAGNOSIS — D509 Iron deficiency anemia, unspecified: Secondary | ICD-10-CM | POA: Diagnosis not present

## 2023-06-16 HISTORY — PX: ESOPHAGOGASTRODUODENOSCOPY (EGD) WITH PROPOFOL: SHX5813

## 2023-06-16 LAB — CBC
HCT: 28.3 % — ABNORMAL LOW (ref 36.0–46.0)
Hemoglobin: 8.4 g/dL — ABNORMAL LOW (ref 12.0–15.0)
MCH: 24.3 pg — ABNORMAL LOW (ref 26.0–34.0)
MCHC: 29.7 g/dL — ABNORMAL LOW (ref 30.0–36.0)
MCV: 82 fL (ref 80.0–100.0)
Platelets: 340 10*3/uL (ref 150–400)
RBC: 3.45 MIL/uL — ABNORMAL LOW (ref 3.87–5.11)
RDW: 16.2 % — ABNORMAL HIGH (ref 11.5–15.5)
WBC: 6.7 10*3/uL (ref 4.0–10.5)
nRBC: 0 % (ref 0.0–0.2)

## 2023-06-16 SURGERY — ESOPHAGOGASTRODUODENOSCOPY (EGD) WITH PROPOFOL
Anesthesia: Monitor Anesthesia Care

## 2023-06-16 MED ORDER — SODIUM CHLORIDE 0.9 % IV SOLN: INTRAVENOUS | Status: DC

## 2023-06-16 MED ORDER — PROPOFOL 500 MG/50ML IV EMUL
INTRAVENOUS | Status: DC | PRN
  Administered 2023-06-16: 100 ug/kg/min via INTRAVENOUS

## 2023-06-16 MED ORDER — LIDOCAINE 2% (20 MG/ML) 5 ML SYRINGE
INTRAMUSCULAR | Status: DC | PRN
  Administered 2023-06-16: 60 mg via INTRAVENOUS

## 2023-06-16 MED ORDER — ENSURE ENLIVE PO LIQD
237.0000 mL | Freq: Three times a day (TID) | ORAL | Status: DC
  Administered 2023-06-16 – 2023-06-17 (×2): 237 mL via ORAL

## 2023-06-16 MED ORDER — EPHEDRINE SULFATE (PRESSORS) 50 MG/ML IJ SOLN
INTRAMUSCULAR | Status: DC | PRN
  Administered 2023-06-16 (×2): 2.5 mg via INTRAVENOUS

## 2023-06-16 MED ORDER — PROPOFOL 10 MG/ML IV BOLUS
INTRAVENOUS | Status: DC | PRN
  Administered 2023-06-16 (×2): 10 mg via INTRAVENOUS

## 2023-06-16 MED ORDER — LACTATED RINGERS IV SOLN: INTRAVENOUS | Status: DC

## 2023-06-16 SURGICAL SUPPLY — 15 items

## 2023-06-16 NOTE — Anesthesia Preprocedure Evaluation (Addendum)
Anesthesia Evaluation  Patient identified by MRN, date of birth, ID band Patient confused    Reviewed: Allergy & Precautions, NPO status , Patient's Chart, lab work & pertinent test results  History of Anesthesia Complications Negative for: history of anesthetic complications  Airway Mallampati: III  TM Distance: >3 FB Neck ROM: Full    Dental  (+) Dental Advisory Given   Pulmonary former smoker   Pulmonary exam normal        Cardiovascular hypertension, Pt. on medications Normal cardiovascular exam   '19 TTE - mild concentric LVH. EF 65% to 70%. Grade 1 diastolic dysfunction. Left atrium was mildly dilated. Trivial MR.    Neuro/Psych  PSYCHIATRIC DISORDERS     Dementia negative neurological ROS     GI/Hepatic Neg liver ROS, PUD,,,  Endo/Other   Pre-DM   Renal/GU CRFRenal disease     Musculoskeletal  (+) Arthritis , Osteoarthritis,    Abdominal   Peds  Hematology  (+) Blood dyscrasia, anemia   Anesthesia Other Findings   Reproductive/Obstetrics                             Anesthesia Physical Anesthesia Plan  ASA: 3  Anesthesia Plan: MAC   Post-op Pain Management: Minimal or no pain anticipated   Induction:   PONV Risk Score and Plan: 2 and Propofol infusion and Treatment may vary due to age or medical condition  Airway Management Planned: Nasal Cannula and Natural Airway  Additional Equipment: None  Intra-op Plan:   Post-operative Plan:   Informed Consent: I have reviewed the patients History and Physical, chart, labs and discussed the procedure including the risks, benefits and alternatives for the proposed anesthesia with the patient or authorized representative who has indicated his/her understanding and acceptance.   Patient has DNR.  Suspend DNR and Discussed DNR with power of attorney.   Consent reviewed with POA  Plan Discussed with: CRNA and  Anesthesiologist  Anesthesia Plan Comments: (Consent and DNR reviewed with POA, grand-daughter listed in chart, via telephone)       Anesthesia Quick Evaluation

## 2023-06-16 NOTE — Anesthesia Procedure Notes (Signed)
Procedure Name: MAC Date/Time: 06/16/2023 2:23 PM  Performed by: Audie Pinto, CRNAPre-anesthesia Checklist: Patient identified, Emergency Drugs available, Suction available and Patient being monitored Patient Re-evaluated:Patient Re-evaluated prior to induction Oxygen Delivery Method: Nasal cannula Induction Type: IV induction Placement Confirmation: positive ETCO2 Dental Injury: Teeth and Oropharynx as per pre-operative assessment

## 2023-06-16 NOTE — Evaluation (Addendum)
Physical Therapy Evaluation Patient Details Name: Megan Stevenson MRN: [id number] DOB: March 23, 1941 Today's Date: 06/16/2023  History of Present Illness  82 y.o. female presenting with chronic fatigue and HgB of 6.5 g/dL  with apparent iron deficiency on 06/14/2023. Patient found to have Symptomatic anemia. PMH significant for dementia, anemia, CKD IV, bradycardia, PUD, lacunar infarction and prediabetes.   Clinical Impression  Prior to admission, patient needed assistance with functional mobility and ADLs. Granddaughter reports patient has aide (6 hours a day/7x a week) to assist with ADLs; family support is provided throughout the week when aide is absent. Currently patient able to perform bed mobility and transfers without physical assistance from PT. Ambulation distance limited due to right knee soreness. Performed standing marches bedside and presented with increased knee flexion on RLE. End of session, PT provided education on LE stretching to mitigate tightness and soreness in RLE.  VSS with O2 > 90% on RA throughout functional mobility. With today's performance, patient would benefit from  post acute rehab venues in order to improve functional mobility, reduce fall risk and maximize safety.           Assistance Recommended at Discharge Frequent or constant Supervision/Assistance  If plan is discharge home, recommend the following:  Can travel by private vehicle  A little help with walking and/or transfers;Assist for transportation;Help with stairs or ramp for entrance        Equipment Recommendations None recommended by PT  Recommendations for Other Services       Functional Status Assessment Patient has had a recent decline in their functional status and demonstrates the ability to make significant improvements in function in a reasonable and predictable amount of time.     Precautions / Restrictions Precautions Precautions: Fall Restrictions Weight Bearing Restrictions: No       Mobility  Bed Mobility Overal bed mobility: Modified Independent             General bed mobility comments: Physican prompted patient to transition to EOB; pt able to sit EOB without physical assistance and increased time.    Transfers Overall transfer level: Needs assistance Equipment used: Rolling walker (2 wheels) Transfers: Sit to/from Stand Sit to Stand: Supervision           General transfer comment: PT with close supervision; patient able to transfer from EOB with increased time and RW support.    Ambulation/Gait Ambulation/Gait assistance: Min guard Gait Distance (Feet): 200 Feet Assistive device: Rolling walker (2 wheels) Gait Pattern/deviations: Step-through pattern, Decreased weight shift to right, Decreased stride length Gait velocity: Decreased. Gait velocity interpretation: <1.8 ft/sec, indicate of risk for recurrent falls   General Gait Details: Presented with step through pattern and decreased velocity. Pt reported pain in right knee with prolonged amb. Decreased weight shift on RLE towards end of gait training.  Stairs            Wheelchair Mobility     Tilt Bed    Modified Rankin (Stroke Patients Only)       Balance Overall balance assessment: Needs assistance Sitting-balance support: Feet supported, No upper extremity supported Sitting balance-Leahy Scale: Good Sitting balance - Comments: Patient able to sit EOB and donn shoes without UE support.   Standing balance support: No upper extremity supported, Bilateral upper extremity supported, During functional activity Standing balance-Leahy Scale: Fair Standing balance comment: In static balance, patient able to stand at sink and wash face with wash clothe without UE support. Throughout functional mobility pt reliant on BUE  with RW.                             Pertinent Vitals/Pain Pain Assessment Pain Assessment: Faces Faces Pain Scale: Hurts a little bit Pain  Location: Right Knee Pain Descriptors / Indicators: Sore Pain Intervention(s): Limited activity within patient's tolerance, Monitored during session    Home Living Family/patient expects to be discharged to:: Private residence Living Arrangements: Alone;Children Available Help at Discharge: Family;Available 24 hours/day;Personal care attendant (Sister) Type of Home: House Home Access: Level entry       Home Layout: One level Home Equipment: Agricultural consultant (2 wheels);BSC/3in1;Shower seat Additional Comments: Son visits everyday and sometimes stays the night; grand daughter and pt's sister help during the day; PCA at night; has 24/7 assist    Prior Function Prior Level of Function : Needs assist  Cognitive Assist : ADLs (cognitive)   ADLs (Cognitive): Intermittent cues       Mobility Comments: Intermittent use of RW when Right knee would have pain ADLs Comments: Granddaughter reports family and aide visit every day in order to assist patient with ADLs. Encouraging pt to use the RW as she chooses not to  - would rather furniture walk; recent fall     Hand Dominance   Dominant Hand: Right    Extremity/Trunk Assessment   Upper Extremity Assessment Upper Extremity Assessment: Generalized weakness    Lower Extremity Assessment Lower Extremity Assessment: Defer to PT evaluation    Cervical / Trunk Assessment Cervical / Trunk Assessment: Normal  Communication   Communication: HOH  Cognition Arousal/Alertness: Awake/alert Behavior During Therapy: WFL for tasks assessed/performed Overall Cognitive Status: History of cognitive impairments - at baseline                                 General Comments: Patient disoriented to time, able to follow one step commands consistently throughout session.        General Comments      Exercises General Exercises - Lower Extremity Hip Flexion/Marching: Standing, AROM, Both, 10 reps   Assessment/Plan    PT  Assessment Patient needs continued PT services  PT Problem List Decreased strength;Decreased activity tolerance;Decreased balance;Decreased mobility;Decreased coordination;Decreased cognition       PT Treatment Interventions Gait training;DME instruction;Functional mobility training;Therapeutic activities;Therapeutic exercise;Balance training;Neuromuscular re-education    PT Goals (Current goals can be found in the Care Plan section)  Acute Rehab PT Goals Patient Stated Goal: Patient would like to return home. PT Goal Formulation: With patient Time For Goal Achievement: 06/30/23 Potential to Achieve Goals: Good    Frequency Min 2X/week     Co-evaluation               AM-PAC PT "6 Clicks" Mobility  Outcome Measure Help needed turning from your back to your side while in a flat bed without using bedrails?: None Help needed moving from lying on your back to sitting on the side of a flat bed without using bedrails?: None Help needed moving to and from a bed to a chair (including a wheelchair)?: None Help needed standing up from a chair using your arms (e.g., wheelchair or bedside chair)?: A Little Help needed to walk in hospital room?: A Little Help needed climbing 3-5 steps with a railing? : A Little 6 Click Score: 21    End of Session Equipment Utilized During Treatment: Gait belt Activity Tolerance:  Patient tolerated treatment well Patient left: in chair;with call bell/phone within reach;with chair alarm set Nurse Communication: Mobility status PT Visit Diagnosis: Muscle weakness (generalized) (M62.81);Difficulty in walking, not elsewhere classified (R26.2)    Time: 4098-1191 PT Time Calculation (min) (ACUTE ONLY): 28 min   Charges:   PT Evaluation $PT Eval Moderate Complexity: 1 Mod PT Treatments $Gait Training: 8-22 mins PT General Charges $$ ACUTE PT VISIT: 1 Visit         Christene Lye, SPT Acute Rehabilitation Services [phone] Secure chat  preferred    Christene Lye 06/16/2023, 12:38 PM

## 2023-06-16 NOTE — Progress Notes (Signed)
Megan Stevenson 2:15 PM  Subjective: Patient seen and examined and her hospital computer chart reviewed and her case discussed with my partner Dr. Bosie Clos and she has no complaints today and we discussed her procedure  Objective: Vital signs stable afebrile no acute distress exam please see preassessment evaluation labs reviewed BUN and creatinine chronically elevated she is iron deficient globin increased with transfusion back positive  Assessment: Guaiac positive anemia patient with dementia  Plan: Okay to proceed with endoscopy with anesthesia assistance with further workup and plans pending those findings  [facility]  office [phone] After 5PM or if no answer call [phone]

## 2023-06-16 NOTE — Evaluation (Signed)
Occupational Therapy Evaluation Patient Details Name: Megan Stevenson MRN: [id number] DOB: [birth year] Today's Date: 06/16/2023   History of Present Illness 82 y.o. female presenting with chronic fatigue and HgB of 6.5 with apparent iron deficiency on 06/14/2023. Patient found to have Symptomatic anemia. PMH significant for dementia, anemia, CKD IV, bradycardia, PUD, lacunar infarction and prediabetes.   Clinical Impression   PTA pt lives at home and between her family and there PCA, she has 24/7 assistance. At baseline, pt ambulates at times with a RW, however family states she is "not good about using it".  Granddaughter present and states her grandmother has paranoid behaviors at times and "outbursts". She states her grandmother has not been sleeping well over the last 2 weeks and has her "days/nights confused" and her paranoid behaviors have been worse lately. Wile at sink, pt began complaining of intense stomach pain as well as n/v and assisted back to her chair - nurse notified. BP118/67. Acute OT will follow however pt will most likely not need OT follow up after DC.      Recommendations for follow up therapy are one component of a multi-disciplinary discharge planning process, led by the attending physician.  Recommendations may be updated based on patient status, additional functional criteria and insurance authorization.   Assistance Recommended at Discharge Frequent or constant Supervision/Assistance  Patient can return home with the following A little help with walking and/or transfers;A little help with bathing/dressing/bathroom;Assistance with cooking/housework;Direct supervision/assist for medications management;Direct supervision/assist for financial management;Assist for transportation;Help with stairs or ramp for entrance    Functional Status Assessment  Patient has had a recent decline in their functional status and demonstrates the ability to make significant improvements in  function in a reasonable and predictable amount of time.  Equipment Recommendations  None recommended by OT    Recommendations for Other Services       Precautions / Restrictions Precautions Precautions: Fall Restrictions Weight Bearing Restrictions: No      Mobility Bed Mobility               General bed mobility comments: OOB in chair    Transfers Overall transfer level: Needs assistance Equipment used: Rolling walker (2 wheels) Transfers: Sit to/from Stand Sit to Stand: Supervision           General transfer comment: minguard after pt reporting she did not feel well      Balance Overall balance assessment: Needs assistance, History of Falls Sitting-balance support: Feet supported, No upper extremity supported Sitting balance-Leahy Scale: Good Sitting balance - Comments: Patient able to sit EOB and donn shoes without UE support.   Standing balance support: No upper extremity supported, Bilateral upper extremity supported, During functional activity Standing balance-Leahy Scale: Fair                             ADL either performed or assessed with clinical judgement   ADL Overall ADL's : Needs assistance/impaired     Grooming: Set up   Upper Body Bathing: Set up;Supervision/ safety;Sitting   Lower Body Bathing: Min guard;Sit to/from stand   Upper Body Dressing : Set up;Supervision/safety;Sitting   Lower Body Dressing: Min guard;Sit to/from stand   Toilet Transfer: Min guard;Ambulation;Rolling walker (2 wheels)   Toileting- Clothing Manipulation and Hygiene: Min guard       Functional mobility during ADLs: Min guard;Rolling walker (2 wheels)       Vision Baseline Vision/History: 1 Wears glasses (reading)  Perception     Praxis      Pertinent Vitals/Pain Pain Assessment Pain Assessment: Faces Faces Pain Scale: Hurts whole lot Pain Location: abdomen Pain Descriptors / Indicators: Sore, Discomfort, Grimacing Pain  Intervention(s): Limited activity within patient's tolerance, Patient requesting pain meds-RN notified     Hand Dominance Right   Extremity/Trunk Assessment Upper Extremity Assessment Upper Extremity Assessment: Generalized weakness   Lower Extremity Assessment Lower Extremity Assessment: Defer to PT evaluation   Cervical / Trunk Assessment Cervical / Trunk Assessment: Normal   Communication Communication Communication: HOH   Cognition Arousal/Alertness: Awake/alert Behavior During Therapy:  (history of paranoid behaviors and visual hallucinations) Overall Cognitive Status: History of cognitive impairments - at baseline per grand daughter; high risk for delirium                                 General Comments: Patient disoriented to time, able to follow one step commands consistently throughout session. Decreased working memory noted; required cues to complete tasks; family states this is baseline     General Comments       Exercises     Shoulder Instructions      Home Living Family/patient expects to be discharged to:: Private residence Living Arrangements: Alone;Children Available Help at Discharge: Family;Available 24 hours/day;Personal care attendant (Sister) Type of Home: House Home Access: Level entry     Home Layout: One level     Bathroom Shower/Tub: Chief Strategy Officer: Handicapped height Bathroom Accessibility: No   Home Equipment: Agricultural consultant (2 wheels);BSC/3in1;Shower seat   Additional Comments: Son visits everyday and sometimes stays the night; grand daughter and pt's sister help during the day; PCA at night; has 24/7 assist      Prior Functioning/Environment Prior Level of Function : Needs assist  Cognitive Assist : ADLs (cognitive)   ADLs (Cognitive): Intermittent cues       Mobility Comments: Intermittent use of RW when Right knee would have pain ADLs Comments: Granddaughter reports family and aide visit  every day in order to assist patient with ADLs. Encouraging pt to use the RW as she chooses not to  - would rather furniture walk; recent fall        OT Problem List: Decreased strength;Decreased activity tolerance;Impaired balance (sitting and/or standing);Decreased cognition;Decreased safety awareness;Decreased knowledge of use of DME or AE;Pain      OT Treatment/Interventions: Self-care/ADL training;DME and/or AE instruction;Therapeutic activities;Patient/family education;Cognitive remediation/compensation    OT Goals(Current goals can be found in the care plan section) Acute Rehab OT Goals Patient Stated Goal: to feel better OT Goal Formulation: With patient/family Time For Goal Achievement: 06/30/23 Potential to Achieve Goals: Good  OT Frequency: Min 1X/week    Co-evaluation              AM-PAC OT "6 Clicks" Daily Activity     Outcome Measure Help from another person eating meals?: None Help from another person taking care of personal grooming?: A Little Help from another person toileting, which includes using toliet, bedpan, or urinal?: A Little Help from another person bathing (including washing, rinsing, drying)?: A Little Help from another person to put on and taking off regular upper body clothing?: A Little Help from another person to put on and taking off regular lower body clothing?: A Little 6 Click Score: 19   End of Session Equipment Utilized During Treatment: Gait belt;Rolling walker (2 wheels) Nurse Communication: Mobility status;Other (comment) (  n/v; abdominal pain)  Activity Tolerance: Patient limited by pain (abdominal pain) Patient left: in chair;with call bell/phone within reach;with chair alarm set;with family/visitor present  OT Visit Diagnosis: Unsteadiness on feet (R26.81);Muscle weakness (generalized) (M62.81);History of falling (Z91.81);Other symptoms and signs involving cognitive function                Time: 0950-1010 OT Time Calculation  (min): 20 min Charges:  OT General Charges $OT Visit: 1 Visit OT Evaluation $OT Eval Low Complexity: 1 Low  Verenice Westrich, OT/L   Acute OT Clinical Specialist Acute Rehabilitation Services Pager [phone] Office [phone]   [facility] 06/16/2023, 10:45 AM

## 2023-06-16 NOTE — Assessment & Plan Note (Signed)
Hgb improved to 8.6 from 6.5 after getting 2 units of RBC in the ED. VSS.  - PT/OT to treat - Continue Protonix 40 mg BID s/p EGD - Avoid NSAIDs  - Consider IV Fe infusion - GI consulted and will perform an EGD to work up possible GIB.

## 2023-06-16 NOTE — Telephone Encounter (Signed)
Reviewed form and placed in PCP's box for completion.  .Tarae Wooden R Rosangelica Pevehouse, CMA  

## 2023-06-16 NOTE — Op Note (Signed)
[facility] Patient Name: Megan Stevenson Procedure Date : 06/16/2023 MRN: [id number] Attending MD: Vida Rigger , MD, [id number] Date of Birth: [birth year] CSN: [id number] Age: 82 Admit Type: Inpatient Procedure:                Upper GI endoscopy Indications:              Iron deficiency anemia, Heme positive stool Providers:                Vida Rigger, MD, Glory Rosebush, RN, Salley Scarlet,                            Technician Referring MD:              Medicines:                Monitored Anesthesia Care Complications:            No immediate complications. Estimated Blood Loss:     Estimated blood loss: none. Procedure:                Pre-Anesthesia Assessment:                           - Prior to the procedure, a History and Physical                            was performed, and patient medications and                            allergies were reviewed. The patient's tolerance of                            previous anesthesia was also reviewed. The risks                            and benefits of the procedure and the sedation                            options and risks were discussed with the patient.                            All questions were answered, and informed consent                            was obtained. Prior Anticoagulants: The patient has                            taken no anticoagulant or antiplatelet agents. ASA                            Grade Assessment: III - A patient with severe                            systemic disease. After reviewing the risks and  benefits, the patient was deemed in satisfactory                            condition to undergo the procedure.                           After obtaining informed consent, the endoscope was                            passed under direct vision. Throughout the                            procedure, the patient's blood pressure, pulse, and                            oxygen  saturations were monitored continuously. The                            [id number]-[id number] ([id number] Olympus endoscope was introduced                            through the mouth, and advanced to the third part                            of duodenum. The upper GI endoscopy was                            accomplished without difficulty. The patient                            tolerated the procedure well. Scope In: Scope Out: Findings:      The larynx was normal.      A small hiatal hernia was present.      The entire examined stomach was normal.      The duodenal bulb, first portion of the duodenum, second portion of the       duodenum and third portion of the duodenum were normal.      The exam was otherwise without abnormality. Impression:               - Normal larynx.                           - Small hiatal hernia.                           - Normal stomach.                           - Normal duodenal bulb, first portion of the                            duodenum, second portion of the duodenum and third                            portion of the duodenum.                           -  The examination was otherwise normal.                           - No specimens collected. Recommendation:           - Resume previous diet today.                           - Continue present medications. No further GI                            workup at this time but see below for                            recommendations and please call me if I could be of                            any further assistance with this hospital stay                           - Return to GI clinic PRN. Consider a oral contrast                            only abdominal pelvic CT to rule out anything                            significant                           - Telephone GI clinic if symptomatic PRN. 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 Procedure Code(s):        --- Professional ---                           [phone], Esophagogastroduodenoscopy, flexible,                            transoral; diagnostic, including collection of                            specimen(s) by brushing or washing,  when performed                            (separate procedure) Diagnosis Code(s):        --- Professional ---                           K44.9, Diaphragmatic hernia without obstruction or                            gangrene                           D50.9, Iron deficiency anemia, unspecified                           R19.5, Other fecal abnormalities CPT copyright 2022 American Medical Association. All rights reserved. The codes documented in this report are preliminary and upon coder review may  be revised to meet current compliance requirements. Vida Rigger, MD 06/16/2023 2:56:41 PM This report has been signed electronically. Number of Addenda: 0

## 2023-06-16 NOTE — Assessment & Plan Note (Signed)
Creatinine has improved to 3.02, baseline of around 2.5-3. - Avoid nephrotoxic agents

## 2023-06-16 NOTE — Progress Notes (Addendum)
     Daily Progress Note Intern Pager: [phone]  Patient name: Megan Stevenson Medical record number: [id number] Date of birth: 1941-01-17 Age: 82 y.o. Gender: female  Primary Care Provider: Jerre Simon, MD Consultants: GI Code Status: DNR  Pt Overview and Major Events to Date:  7/6: Admitted, transfused with 2 units   Assessment and Plan: Megan Stevenson is a 82 y.o. female presenting with chronic fatigue and Hgb of 6.5 with apparent iron deficiency. Past medical history pertinent for dementia, anemia, CKD 4, bradycardia, PUD, lacunar infarction, and prediabetes.  Hospital Problem List      Hospital     * (Principal) Symptomatic anemia     Hgb improved to 8.6 from 6.5 after getting 2 units of RBC in the ED.  VSS.  - PT/OT to treat - Continue Protonix 40 mg BID s/p EGD - Avoid NSAIDs  - Consider IV Fe infusion - GI consulted and will perform an EGD to work up possible GIB.        CKD (chronic kidney disease) stage 4, GFR 15-29 ml/min (HCC)     Creatinine has improved to 3.02, baseline of around 2.5-3. - Avoid nephrotoxic agents     Chronic Stable Conditions: HTN: Continue Amlodipine 10 mg, Clonidine 0.2 mg, Hydralazine 25 mg BID Dementia: Donepezil 10 mg, Seroquel 25 mg, Lexapro 5 mg HLD: Continue Lipitor 10 mg  FEN/GI: Regular, diet PPx: SCDs Dispo: GI eval, pending clinical improvement  Subjective:  No issues overnight.  Patient is doing well this morning.  Was just waking up from sleep.  No complaints and feels like she is doing better compared to yesterday.  Patient cannot remember speaking with GI yesterday or if her family came.  Objective: Temp:  [97.3 F (36.3 C)-98.6 F (37 C)] 98.3 F (36.8 C) (07/08 0744) Pulse Rate:  [42-47] 47 (07/08 0411) Resp:  [16-18] 16 (07/08 0744) BP: (99-144)/(49-64) 125/53 (07/08 0744) SpO2:  [100 %] 100 % (07/08 0744)  Physical Exam: General: NAD, laying comfortably supine in bed Cardiovascular: RRR. No  M/R/G Respiratory: CTAB, normal WOB on RA, no wheezing, no crackles Abdomen: soft, nontender, nondistended, bowel sounds present Extremities: no BLE edema Neuro: A&Ox2 (name, location, but not month/year/president). No focal neurological deficits.   Laboratory: Most recent CBC Lab Results  Component Value Date   WBC 6.7 06/16/2023   HGB 8.4 (L) 06/16/2023   HCT 28.3 (L) 06/16/2023   MCV 82.0 06/16/2023   PLT 340 06/16/2023   Most recent BMP    Latest Ref Rng & Units 06/15/2023    1:33 AM  BMP  Glucose 70 - 99 mg/dL 93   BUN 8 - 23 mg/dL 32   Creatinine 1.47 - 1.00 mg/dL 8.29   Sodium 562 - 130 mmol/L 142   Potassium 3.5 - 5.1 mmol/L 4.2   Chloride 98 - 111 mmol/L 115   CO2 22 - 32 mmol/L 20   Calcium 8.9 - 10.3 mg/dL 8.6     Imaging/Diagnostic Tests: No new imaging.  Fortunato Curling, DO 06/16/2023, 11:22 AM PGY-1, [facility] Family Medicine  FPTS Intern pager: [phone], text pages welcome Secure chat group [facility] Teaching Service

## 2023-06-16 NOTE — TOC CM/SW Note (Signed)
Transition of Care [facility]) - Inpatient Brief Assessment   Patient Details  Name: Megan Stevenson MRN: [id number] Date of Birth: 25-Jan-1941  Transition of Care [facility]) CM/SW Contact:    Harriet Masson, RN Phone Number: 06/16/2023, 9:43 AM   Clinical Narrative: Patient admitted for Symptomatic anemia.EGD scheduled for today at 1400. Patient uses walker at baseline.  TOC following.   Transition of Care Asessment: Insurance and Status: Insurance coverage has been reviewed Patient has primary care physician: Yes Home environment has been reviewed: safe to discharge home when medicaly stable Prior level of function:: fine  at home Prior/Current Home Services: No current home services Social Determinants of Health Reivew: SDOH reviewed no interventions necessary Readmission risk has been reviewed: Yes Transition of care needs: no transition of care needs at this time

## 2023-06-16 NOTE — Transfer of Care (Signed)
Immediate Anesthesia Transfer of Care Note  Patient: Megan Stevenson  Procedure(s) Performed: ESOPHAGOGASTRODUODENOSCOPY (EGD) WITH PROPOFOL  Patient Location: Endoscopy Unit  Anesthesia Type:MAC  Level of Consciousness: drowsy and patient cooperative  Airway & Oxygen Therapy: Patient Spontanous Breathing and Patient connected to nasal cannula oxygen  Post-op Assessment: Report given to RN and Post -op Vital signs reviewed and stable  Post vital signs: Reviewed and stable  Last Vitals:  Vitals Value Taken Time  BP    Temp    Pulse 42 06/16/23 1443  Resp 15 06/16/23 1443  SpO2 100 % 06/16/23 1443  Vitals shown include unvalidated device data.  Last Pain:  Vitals:   06/16/23 1348  TempSrc: Temporal  PainSc: 0-No pain         Complications: No notable events documented.

## 2023-06-16 NOTE — Telephone Encounter (Signed)
Patient's granddaughter dropped off personal care services form to be completed. Last DOS was 06/13/23. Placed in [employer].

## 2023-06-17 DIAGNOSIS — D649 Anemia, unspecified: Secondary | ICD-10-CM | POA: Diagnosis not present

## 2023-06-17 DIAGNOSIS — D509 Iron deficiency anemia, unspecified: Secondary | ICD-10-CM | POA: Diagnosis not present

## 2023-06-17 LAB — CBC
HCT: 28.4 % — ABNORMAL LOW (ref 36.0–46.0)
Hemoglobin: 8.6 g/dL — ABNORMAL LOW (ref 12.0–15.0)
MCH: 25.3 pg — ABNORMAL LOW (ref 26.0–34.0)
MCHC: 30.3 g/dL (ref 30.0–36.0)
MCV: 83.5 fL (ref 80.0–100.0)
Platelets: 330 10*3/uL (ref 150–400)
RBC: 3.4 MIL/uL — ABNORMAL LOW (ref 3.87–5.11)
RDW: 16.7 % — ABNORMAL HIGH (ref 11.5–15.5)
WBC: 7.5 10*3/uL (ref 4.0–10.5)
nRBC: 0 % (ref 0.0–0.2)

## 2023-06-17 MED ORDER — SODIUM CHLORIDE 0.9 % IV SOLN
250.0000 mg | Freq: Once | INTRAVENOUS | Status: AC
  Administered 2023-06-17: 250 mg via INTRAVENOUS
  Filled 2023-06-17: qty 20

## 2023-06-17 NOTE — Discharge Summary (Addendum)
Family Medicine Teaching [facility] Discharge Summary  Patient name: Megan Stevenson Medical record number: [id number] Date of birth: [birth year] Age: 82 y.o. Gender: female Date of Admission: 06/14/2023  Date of Discharge: 06/17/23  Admitting Physician: Carney Living, MD  Primary Care Provider: Jerre Simon, MD Consultants: GI  Indication for Hospitalization: Symptomatic Anemia  Brief Hospital Course:  Megan Stevenson is a 82 y.o.female with a history of dementia, anemia, CKD 4, bradycardia, PUD, lacunar infarction, and prediabetes who was admitted to the [facility] Medicine teaching Service at [facility] for symptomatic anemia. Her hospital course is detailed below:  Symptomatic Anemia Patient presented to the ED with chronic fatigue and hemoglobin of 6.5 with iron deficiency.  Per family, patient has had a lack of appetite. Transfused with 2 units of RBCs in the ED, improved Hgb to 8.6.  Due to her dementia, goals of care conversation was had with granddaughter who desired GI workup.  GI was consulted and performed an EGD which was showed a showed a small hiatal hernia, but otherwise normal findings without ulceration in stomach, duodenum. She was also markedly iron deficient with Ferritin 7, Iron 21, Sat ratio 6%.   She was treated with an IV iron infusion prior to discharge.  Other chronic conditions were medically managed with home medications and formulary alternatives as necessary:  HTN: Continue Amlodipine 10 mg, Hydralazine 25 mg BID Dementia: Donepezil 10 mg, Seroquel 25 mg, Lexapro 5 mg HLD: Continue Lipitor 10 mg  PCP Follow-up Recommendations:  Patient with marked iron deficiency anemia. May not be a great candidate for PO Iron supplementation given CKD/poor absorption. Consider outpatient iron infusions. Recommend nephrology follow-up for her CKD.  We discontinued her clonidine on discharge. BP was well-controlled in the hospital without it.   Discharge  Diagnoses/Problem List:  Principal Problem:   Symptomatic anemia Active Problems:   CKD (chronic kidney disease) stage 4, GFR 15-29 ml/min (HCC)    Disposition: home  Discharge Condition: stable  Discharge Exam:  General: NAD, laying comfortably supine in bed Cardiovascular: RRR. No M/R/G Respiratory: CTAB, normal WOB on RA, no wheezing, no crackles Abdomen: soft, nontender, nondistended, bowel sounds present Extremities: no BLE edema Neuro: A&Ox2 (name, location, but not month/year). No focal neurological deficits.   Significant Procedures:  EGD - 06/16/23 - Normal larynx. Small hiatal hernia. Normal stomach. Normal duodenal bulb, first portion of the duodenum, second portion of the duodenum and third portion of the duodenum. The examination was otherwise normal. No specimens collected.  Significant Labs and Imaging:  Recent Labs  Lab 06/16/23 0449 06/17/23 0440  WBC 6.7 7.5  HGB 8.4* 8.6*  HCT 28.3* 28.4*  PLT 340 330    Results/Tests Pending at Time of Discharge: none  Discharge Medications:  Allergies as of 06/17/2023       Reactions   Nsaids Other (See Comments)   Has chronic renal disease   Benazepril Hcl Other (See Comments)    Dizziness   Furosemide Other (See Comments)   Hypernatremia   Atenolol Other (See Comments)   Headaches        Medication List     STOP taking these medications    cloNIDine 0.2 MG tablet Commonly known as: Catapres   FeroSul 325 (65 FE) MG tablet Generic drug: ferrous sulfate       TAKE these medications    amLODipine 10 MG tablet Commonly known as: NORVASC TAKE 1 TABLET(10 MG) BY MOUTH DAILY   atorvastatin 10 MG tablet Commonly  known as: LIPITOR TAKE 1 TABLET(10 MG) BY MOUTH AT BEDTIME   diclofenac Sodium 1 % Gel Commonly known as: Voltaren Apply to right knee 2-3 times daily as needed for pain   donepezil 10 MG tablet Commonly known as: ARICEPT TAKE 1 TABLET(10 MG) BY MOUTH DAILY   Ensure Active High  Protein Liqd Take 1 Box by mouth 2 (two) times daily.   escitalopram 5 MG tablet Commonly known as: LEXAPRO Take 1 tablet (5 mg total) by mouth daily.   hydrALAZINE 25 MG tablet Commonly known as: APRESOLINE Take 25 mg by mouth 2 (two) times daily.   QUEtiapine 25 MG tablet Commonly known as: SEROQUEL TAKE 1 TABLET(25 MG) BY MOUTH AT BEDTIME        Discharge Instructions: Please refer to Patient Instructions section of EMR for full details.  Patient was counseled important signs and symptoms that should prompt return to medical care, changes in medications, dietary instructions, activity restrictions, and follow up appointments.   Follow-Up Appointments:  Follow-up Information     Dagoberto Ligas, MD Follow up in 2 week(s).   Specialty: Nephrology Contact information: [address] [place] [address] [phone]                 Fortunato Curling, DO 06/17/2023, 11:38 AM PGY-1, [facility] Family Medicine    I have evaluated this patient along with Dr. Fatima Blank and reviewed the above note, making necessary revisions.  Dorothyann Gibbs, MD 06/17/2023, 12:28 PM PGY-3, [facility] Family Medicine

## 2023-06-17 NOTE — TOC Initial Note (Signed)
Transition of Care [facility]) - Initial/Assessment Note    Patient Details  Name: Megan Stevenson MRN: [id number] Date of Birth: [birth year]  Transition of Care [facility]) CM/SW Contact:    Harriet Masson, RN Phone Number: 06/17/2023, 12:00 PM  Clinical Narrative:                 Spoke to patient's granddaughter, Megan Stevenson, due to patient's dementia.  Megan Stevenson is agreeable to home health. Patient has used [facility] in the past and would like to use them again.  Morrie Sheldon with [facility] accepted referral.  Patient lives alone but has an aide at night and patient's sister and granddaughter stays with her during the day. Patient has transportation to apts and at discharge.   Address, Phone number and PCP verified.  Expected Discharge Plan: Home w Home Health Services Barriers to Discharge: Continued Medical Work up   Patient Goals and CMS Choice Patient states their goals for this hospitalization and ongoing recovery are:: return home CMS Medicare.gov Compare Post Acute Care list provided to:: Patient Represenative (must comment)        Expected Discharge Plan and Services     Post Acute Care Choice: Home Health Living arrangements for the past 2 months: Single Family Home                           HH Arranged: PT, RN HH Agency: [facility] Date HH Agency Contacted: 06/17/23 Time HH Agency Contacted: 1200 Representative spoke with at [facility] Agency: Morrie Sheldon  Prior Living Arrangements/Services Living arrangements for the past 2 months: Single Family Home Lives with:: Other (Comment) (lives alone but has aide at night and Granddaughter or sister is there during the day) Patient language and need for interpreter reviewed:: Yes Do you feel safe going back to the place where you live?: Yes      Need for Family Participation in Patient Care: Yes (Comment) Care giver support system in place?: Yes (comment) Current home services: DME (walker) Criminal Activity/Legal  Involvement Pertinent to Current Situation/Hospitalization: No - Comment as needed  Activities of Daily Living Home Assistive Devices/Equipment: Environmental consultant (specify type) ADL Screening (condition at time of admission) Patient's cognitive ability adequate to safely complete daily activities?: Yes Is the patient deaf or have difficulty hearing?: No Does the patient have difficulty seeing, even when wearing glasses/contacts?: No Does the patient have difficulty concentrating, remembering, or making decisions?: Yes Patient able to express need for assistance with ADLs?: Yes Does the patient have difficulty dressing or bathing?: Yes Independently performs ADLs?: No Communication: Independent Grooming: Independent Feeding: Independent Bathing: Needs assistance Toileting: Needs assistance In/Out Bed: Needs assistance Walks in Home: Needs assistance Does the patient have difficulty walking or climbing stairs?: Yes Weakness of Legs: Both Weakness of Arms/Hands: Both  Permission Sought/Granted Permission sought to share information with : Photographer granted to share info w AGENCY: HH        Emotional Assessment Appearance:: Appears stated age Attitude/Demeanor/Rapport: Engaged Affect (typically observed): Accepting   Alcohol / Substance Use: Not Applicable Psych Involvement: No (comment)  Admission diagnosis:  Anemia [D64.9] Symptomatic anemia [D64.9] Patient Active Problem List   Diagnosis Date Noted   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   Tooth pain 02/03/2023   HLD (hyperlipidemia) 06/28/2022   Prediabetes 06/28/2022   Hypertension 06/28/2022   Symptomatic anemia 02/07/2022   Hearing screen with abnormal findings 06/18/2021  At risk for falls 06/18/2021   Sarcopenia 06/18/2021   Impaired functional mobility, balance, gait, and endurance 06/18/2021   Dementia (HCC) 04/17/2021   Osteoarthritis of both knees 04/17/2021   CKD (chronic kidney  disease) stage 4, GFR 15-29 ml/min (HCC) 06/13/2018   Microcytic anemia 06/13/2018   Heart murmur 06/13/2018   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 [place] [address] Phone: [phone] Fax: [phone]  [facility] [address] [place] [address] Phone: [phone] Fax: [phone]     Social Determinants of Health (SDOH) Social History: SDOH Screenings   Food Insecurity: No Food Insecurity (06/14/2023)  Housing: Low Risk  (06/14/2023)  Transportation Needs: No Transportation Needs (06/14/2023)  Utilities: Not At Risk (06/14/2023)  Alcohol Screen: Low Risk  (05/19/2023)  Depression (PHQ2-9): High Risk (06/13/2023)  Financial Resource Strain: Low Risk  (05/19/2023)  Physical Activity: Inactive (05/19/2023)  Social Connections: Moderately Isolated (05/19/2023)  Stress: No Stress Concern Present (05/19/2023)  Tobacco Use: Medium Risk (06/16/2023)   SDOH Interventions:     Readmission Risk Interventions     No data to display

## 2023-06-17 NOTE — Discharge Instructions (Addendum)
Dear Megan Stevenson,  Thank you for letting us participate in your care. You were hospitalized for and diagnosed with Symptomatic anemia. You were treated with a blood transfusion due to having a low hematology and an endoscopy which showed a small hiatal hernia and was negative otherwise.  POST-HOSPITAL & CARE INSTRUCTIONS Go to your follow up appointments (listed below)   DOCTOR'S APPOINTMENT   Future Appointments  Date Time Provider Department Center  06/24/2023  9:10 AM Jerre Simon, MD [facility]     Take care and be well!  Family Medicine Teaching Service Inpatient Team [place]  [facility]  [address] [place], [place] [address] [phone]

## 2023-06-17 NOTE — Anesthesia Postprocedure Evaluation (Signed)
Anesthesia Post Note  Patient: Megan Stevenson  Procedure(s) Performed: ESOPHAGOGASTRODUODENOSCOPY (EGD) WITH PROPOFOL     Patient location during evaluation: PACU Anesthesia Type: MAC Level of consciousness: awake and alert Pain management: pain level controlled Vital Signs Assessment: post-procedure vital signs reviewed and stable Respiratory status: spontaneous breathing, nonlabored ventilation and respiratory function stable Cardiovascular status: stable and blood pressure returned to baseline Anesthetic complications: no   No notable events documented.  Last Vitals:  Vitals:   06/16/23 2200 06/17/23 0726  BP: (!) 119/58 (!) 125/53  Pulse:  (!) 44  Resp:  18  Temp:  36.4 C  SpO2:  100%    Last Pain:  Vitals:   06/17/23 0745  TempSrc:   PainSc: 0-No pain                 Beryle Lathe

## 2023-06-18 ENCOUNTER — Encounter (HOSPITAL_COMMUNITY): Payer: Self-pay | Admitting: Gastroenterology

## 2023-06-18 ENCOUNTER — Encounter: Payer: Self-pay | Admitting: Student

## 2023-06-19 DIAGNOSIS — D631 Anemia in chronic kidney disease: Secondary | ICD-10-CM | POA: Diagnosis not present

## 2023-06-19 DIAGNOSIS — Z9181 History of falling: Secondary | ICD-10-CM | POA: Diagnosis not present

## 2023-06-19 DIAGNOSIS — N184 Chronic kidney disease, stage 4 (severe): Secondary | ICD-10-CM | POA: Diagnosis not present

## 2023-06-19 DIAGNOSIS — Z87891 Personal history of nicotine dependence: Secondary | ICD-10-CM | POA: Diagnosis not present

## 2023-06-19 DIAGNOSIS — Z602 Problems related to living alone: Secondary | ICD-10-CM | POA: Diagnosis not present

## 2023-06-19 DIAGNOSIS — Z604 Social exclusion and rejection: Secondary | ICD-10-CM | POA: Diagnosis not present

## 2023-06-19 DIAGNOSIS — Z556 Problems related to health literacy: Secondary | ICD-10-CM | POA: Diagnosis not present

## 2023-06-19 DIAGNOSIS — D509 Iron deficiency anemia, unspecified: Secondary | ICD-10-CM | POA: Diagnosis not present

## 2023-06-19 DIAGNOSIS — E785 Hyperlipidemia, unspecified: Secondary | ICD-10-CM | POA: Diagnosis not present

## 2023-06-19 DIAGNOSIS — F0390 Unspecified dementia without behavioral disturbance: Secondary | ICD-10-CM | POA: Diagnosis not present

## 2023-06-19 DIAGNOSIS — Z635 Disruption of family by separation and divorce: Secondary | ICD-10-CM | POA: Diagnosis not present

## 2023-06-19 DIAGNOSIS — R001 Bradycardia, unspecified: Secondary | ICD-10-CM | POA: Diagnosis not present

## 2023-06-19 DIAGNOSIS — I129 Hypertensive chronic kidney disease with stage 1 through stage 4 chronic kidney disease, or unspecified chronic kidney disease: Secondary | ICD-10-CM | POA: Diagnosis not present

## 2023-06-19 DIAGNOSIS — M109 Gout, unspecified: Secondary | ICD-10-CM | POA: Diagnosis not present

## 2023-06-20 NOTE — Telephone Encounter (Signed)
Called patient's granddaughter. Advised that forms were completed. Copy made and placed in batch scanning. Faxed to [email or web address].   Original at front desk for patient to pick up.   Veronda Prude, RN

## 2023-06-24 ENCOUNTER — Inpatient Hospital Stay: Payer: Medicare Other | Admitting: Student

## 2023-06-25 DIAGNOSIS — D509 Iron deficiency anemia, unspecified: Secondary | ICD-10-CM | POA: Diagnosis not present

## 2023-06-25 DIAGNOSIS — D631 Anemia in chronic kidney disease: Secondary | ICD-10-CM | POA: Diagnosis not present

## 2023-06-25 DIAGNOSIS — F0390 Unspecified dementia without behavioral disturbance: Secondary | ICD-10-CM | POA: Diagnosis not present

## 2023-06-25 DIAGNOSIS — I129 Hypertensive chronic kidney disease with stage 1 through stage 4 chronic kidney disease, or unspecified chronic kidney disease: Secondary | ICD-10-CM | POA: Diagnosis not present

## 2023-06-25 DIAGNOSIS — R001 Bradycardia, unspecified: Secondary | ICD-10-CM | POA: Diagnosis not present

## 2023-06-25 DIAGNOSIS — N184 Chronic kidney disease, stage 4 (severe): Secondary | ICD-10-CM | POA: Diagnosis not present

## 2023-06-27 DIAGNOSIS — I129 Hypertensive chronic kidney disease with stage 1 through stage 4 chronic kidney disease, or unspecified chronic kidney disease: Secondary | ICD-10-CM | POA: Diagnosis not present

## 2023-06-27 DIAGNOSIS — D631 Anemia in chronic kidney disease: Secondary | ICD-10-CM | POA: Diagnosis not present

## 2023-06-27 DIAGNOSIS — F0390 Unspecified dementia without behavioral disturbance: Secondary | ICD-10-CM | POA: Diagnosis not present

## 2023-06-27 DIAGNOSIS — D509 Iron deficiency anemia, unspecified: Secondary | ICD-10-CM | POA: Diagnosis not present

## 2023-06-27 DIAGNOSIS — N184 Chronic kidney disease, stage 4 (severe): Secondary | ICD-10-CM | POA: Diagnosis not present

## 2023-06-27 DIAGNOSIS — R001 Bradycardia, unspecified: Secondary | ICD-10-CM | POA: Diagnosis not present

## 2023-06-30 ENCOUNTER — Encounter: Payer: Self-pay | Admitting: Student

## 2023-06-30 ENCOUNTER — Ambulatory Visit (INDEPENDENT_AMBULATORY_CARE_PROVIDER_SITE_OTHER): Payer: Medicare Other | Admitting: Student

## 2023-06-30 ENCOUNTER — Other Ambulatory Visit: Payer: Self-pay

## 2023-06-30 VITALS — BP 177/63 | HR 72 | Ht 67.0 in | Wt 146.8 lb

## 2023-06-30 DIAGNOSIS — D631 Anemia in chronic kidney disease: Secondary | ICD-10-CM | POA: Diagnosis not present

## 2023-06-30 DIAGNOSIS — D509 Iron deficiency anemia, unspecified: Secondary | ICD-10-CM | POA: Diagnosis not present

## 2023-06-30 DIAGNOSIS — N189 Chronic kidney disease, unspecified: Secondary | ICD-10-CM

## 2023-06-30 DIAGNOSIS — R001 Bradycardia, unspecified: Secondary | ICD-10-CM | POA: Diagnosis not present

## 2023-06-30 DIAGNOSIS — I129 Hypertensive chronic kidney disease with stage 1 through stage 4 chronic kidney disease, or unspecified chronic kidney disease: Secondary | ICD-10-CM | POA: Diagnosis not present

## 2023-06-30 DIAGNOSIS — F0390 Unspecified dementia without behavioral disturbance: Secondary | ICD-10-CM | POA: Diagnosis not present

## 2023-06-30 DIAGNOSIS — N184 Chronic kidney disease, stage 4 (severe): Secondary | ICD-10-CM | POA: Diagnosis not present

## 2023-06-30 NOTE — Patient Instructions (Signed)
It was wonderful to see you today. Thank you for allowing me to be a part of your care. Below is a short summary of what we discussed at your visit today:  Glad to hear you are doing much better.  Please make sure to call [facility] to discuss with them that Ms. Rankin was recently seen in the hospital for symptomatic anemia and she was checked for GI bleed which was negative.  We suspect that her anemia could be related to her chronic kidney disease and she might be a good candidate for iron infusion therapy or EPO therapy.  Please bring all of your medications to every appointment!  If you have any questions or concerns, please do not hesitate to contact us via phone or MyChart message.   Jerre Simon, MD [facility]

## 2023-06-30 NOTE — Progress Notes (Signed)
    SUBJECTIVE:   CHIEF COMPLAINT / HPI:   Patient is an 82 year old female with history of CKD presenting for hospital follow-up.  She was recently admitted in the hospital due to symptomatic anemia.  Patient received iron infusion and blood transfusion during her decision.  EGD was negative for any source of bleeding.  She reports she has been doing great since hospital discharge and feels like her strength is back but not at baseline.  Granddaughter who accompanied patient is concerned about poor sleep.  Reports patient stays up mostly at night with sleeping during the daytime.  Granddaughter Ms. Rivka Safer will be taking over patient's care.  Best contact number is [phone].  PERTINENT  PMH / PSH: Reviewed   OBJECTIVE:   BP (!) 177/63   Pulse 72   Ht 5\' 7"  (1.702 m)   Wt 146 lb 12.8 oz (66.6 kg)   SpO2 100%   BMI 22.99 kg/m    Physical Exam General: Alert, elderly, NAD Cardiovascular: RRR, No Murmurs, Normal S2/S2 Respiratory: CTAB, No wheezing or Rales Abdomen: No distension or tenderness Extremities: No edema on extremities     ASSESSMENT/PLAN:   Microcytic anemia Most recent hemoglobin was 8.6 and MCV of 83.5.  He has to be normocytic anemia, in the absence of any acute source of bleeding suspect anemia of chronic disease in patient with history of CKD.  Family reported patient already established care with [facility].  Advised family to call the nephrologist to discuss evaluation of anemia and explore possible iron infusion therapy vs EPO treatment. - Ordered Lab for CBC.   Poor sleep Patient likely have underlying insomnia which could be attributed to her dementia.  Discussed proving sleep hygiene and considering melatonin.  Will monitor closely and reassess if no improvement after conservative management.  Jerre Simon, MD [facility]

## 2023-06-30 NOTE — Assessment & Plan Note (Signed)
Most recent hemoglobin was 8.6 and MCV of 83.5.  He has to be normocytic anemia, in the absence of any acute source of bleeding suspect anemia of chronic disease in patient with history of CKD.  Family reported patient already established care with [facility].  Advised family to call the nephrologist to discuss evaluation of anemia and explore possible iron infusion therapy vs EPO treatment. - Ordered Lab for CBC.

## 2023-07-01 LAB — CBC
Hematocrit: 27.4 % — ABNORMAL LOW (ref 34.0–46.6)
Hemoglobin: 8.5 g/dL — ABNORMAL LOW (ref 11.1–15.9)
MCH: 24.5 pg — ABNORMAL LOW (ref 26.6–33.0)
MCHC: 31 g/dL — ABNORMAL LOW (ref 31.5–35.7)
MCV: 79 fL (ref 79–97)
Platelets: 329 10*3/uL (ref 150–450)
RBC: 3.47 x10E6/uL — ABNORMAL LOW (ref 3.77–5.28)
RDW: 16 % — ABNORMAL HIGH (ref 11.7–15.4)
WBC: 7.5 10*3/uL (ref 3.4–10.8)

## 2023-07-02 DIAGNOSIS — I129 Hypertensive chronic kidney disease with stage 1 through stage 4 chronic kidney disease, or unspecified chronic kidney disease: Secondary | ICD-10-CM | POA: Diagnosis not present

## 2023-07-02 DIAGNOSIS — R001 Bradycardia, unspecified: Secondary | ICD-10-CM | POA: Diagnosis not present

## 2023-07-02 DIAGNOSIS — D631 Anemia in chronic kidney disease: Secondary | ICD-10-CM | POA: Diagnosis not present

## 2023-07-02 DIAGNOSIS — F0390 Unspecified dementia without behavioral disturbance: Secondary | ICD-10-CM | POA: Diagnosis not present

## 2023-07-02 DIAGNOSIS — D509 Iron deficiency anemia, unspecified: Secondary | ICD-10-CM | POA: Diagnosis not present

## 2023-07-02 DIAGNOSIS — N184 Chronic kidney disease, stage 4 (severe): Secondary | ICD-10-CM | POA: Diagnosis not present

## 2023-07-04 DIAGNOSIS — D509 Iron deficiency anemia, unspecified: Secondary | ICD-10-CM | POA: Diagnosis not present

## 2023-07-04 DIAGNOSIS — F0390 Unspecified dementia without behavioral disturbance: Secondary | ICD-10-CM | POA: Diagnosis not present

## 2023-07-04 DIAGNOSIS — N184 Chronic kidney disease, stage 4 (severe): Secondary | ICD-10-CM | POA: Diagnosis not present

## 2023-07-04 DIAGNOSIS — R001 Bradycardia, unspecified: Secondary | ICD-10-CM | POA: Diagnosis not present

## 2023-07-04 DIAGNOSIS — D631 Anemia in chronic kidney disease: Secondary | ICD-10-CM | POA: Diagnosis not present

## 2023-07-04 DIAGNOSIS — I129 Hypertensive chronic kidney disease with stage 1 through stage 4 chronic kidney disease, or unspecified chronic kidney disease: Secondary | ICD-10-CM | POA: Diagnosis not present

## 2023-07-09 DIAGNOSIS — F0390 Unspecified dementia without behavioral disturbance: Secondary | ICD-10-CM | POA: Diagnosis not present

## 2023-07-09 DIAGNOSIS — D509 Iron deficiency anemia, unspecified: Secondary | ICD-10-CM | POA: Diagnosis not present

## 2023-07-09 DIAGNOSIS — D631 Anemia in chronic kidney disease: Secondary | ICD-10-CM | POA: Diagnosis not present

## 2023-07-09 DIAGNOSIS — R001 Bradycardia, unspecified: Secondary | ICD-10-CM | POA: Diagnosis not present

## 2023-07-09 DIAGNOSIS — I129 Hypertensive chronic kidney disease with stage 1 through stage 4 chronic kidney disease, or unspecified chronic kidney disease: Secondary | ICD-10-CM | POA: Diagnosis not present

## 2023-07-09 DIAGNOSIS — N184 Chronic kidney disease, stage 4 (severe): Secondary | ICD-10-CM | POA: Diagnosis not present

## 2023-07-11 DIAGNOSIS — F0390 Unspecified dementia without behavioral disturbance: Secondary | ICD-10-CM | POA: Diagnosis not present

## 2023-07-11 DIAGNOSIS — I129 Hypertensive chronic kidney disease with stage 1 through stage 4 chronic kidney disease, or unspecified chronic kidney disease: Secondary | ICD-10-CM | POA: Diagnosis not present

## 2023-07-11 DIAGNOSIS — D509 Iron deficiency anemia, unspecified: Secondary | ICD-10-CM | POA: Diagnosis not present

## 2023-07-11 DIAGNOSIS — D631 Anemia in chronic kidney disease: Secondary | ICD-10-CM | POA: Diagnosis not present

## 2023-07-11 DIAGNOSIS — R001 Bradycardia, unspecified: Secondary | ICD-10-CM | POA: Diagnosis not present

## 2023-07-11 DIAGNOSIS — N184 Chronic kidney disease, stage 4 (severe): Secondary | ICD-10-CM | POA: Diagnosis not present

## 2023-07-17 DIAGNOSIS — D509 Iron deficiency anemia, unspecified: Secondary | ICD-10-CM | POA: Diagnosis not present

## 2023-07-17 DIAGNOSIS — R001 Bradycardia, unspecified: Secondary | ICD-10-CM | POA: Diagnosis not present

## 2023-07-17 DIAGNOSIS — F0390 Unspecified dementia without behavioral disturbance: Secondary | ICD-10-CM | POA: Diagnosis not present

## 2023-07-17 DIAGNOSIS — I129 Hypertensive chronic kidney disease with stage 1 through stage 4 chronic kidney disease, or unspecified chronic kidney disease: Secondary | ICD-10-CM | POA: Diagnosis not present

## 2023-07-17 DIAGNOSIS — N184 Chronic kidney disease, stage 4 (severe): Secondary | ICD-10-CM | POA: Diagnosis not present

## 2023-07-17 DIAGNOSIS — D631 Anemia in chronic kidney disease: Secondary | ICD-10-CM | POA: Diagnosis not present

## 2023-07-18 DIAGNOSIS — R001 Bradycardia, unspecified: Secondary | ICD-10-CM | POA: Diagnosis not present

## 2023-07-18 DIAGNOSIS — F0390 Unspecified dementia without behavioral disturbance: Secondary | ICD-10-CM | POA: Diagnosis not present

## 2023-07-18 DIAGNOSIS — D509 Iron deficiency anemia, unspecified: Secondary | ICD-10-CM | POA: Diagnosis not present

## 2023-07-18 DIAGNOSIS — I129 Hypertensive chronic kidney disease with stage 1 through stage 4 chronic kidney disease, or unspecified chronic kidney disease: Secondary | ICD-10-CM | POA: Diagnosis not present

## 2023-07-18 DIAGNOSIS — N184 Chronic kidney disease, stage 4 (severe): Secondary | ICD-10-CM | POA: Diagnosis not present

## 2023-07-18 DIAGNOSIS — D631 Anemia in chronic kidney disease: Secondary | ICD-10-CM | POA: Diagnosis not present

## 2023-07-19 DIAGNOSIS — N184 Chronic kidney disease, stage 4 (severe): Secondary | ICD-10-CM | POA: Diagnosis not present

## 2023-07-19 DIAGNOSIS — Z602 Problems related to living alone: Secondary | ICD-10-CM | POA: Diagnosis not present

## 2023-07-19 DIAGNOSIS — Z635 Disruption of family by separation and divorce: Secondary | ICD-10-CM | POA: Diagnosis not present

## 2023-07-19 DIAGNOSIS — M109 Gout, unspecified: Secondary | ICD-10-CM | POA: Diagnosis not present

## 2023-07-19 DIAGNOSIS — E785 Hyperlipidemia, unspecified: Secondary | ICD-10-CM | POA: Diagnosis not present

## 2023-07-19 DIAGNOSIS — D509 Iron deficiency anemia, unspecified: Secondary | ICD-10-CM | POA: Diagnosis not present

## 2023-07-19 DIAGNOSIS — I129 Hypertensive chronic kidney disease with stage 1 through stage 4 chronic kidney disease, or unspecified chronic kidney disease: Secondary | ICD-10-CM | POA: Diagnosis not present

## 2023-07-19 DIAGNOSIS — D631 Anemia in chronic kidney disease: Secondary | ICD-10-CM | POA: Diagnosis not present

## 2023-07-19 DIAGNOSIS — Z604 Social exclusion and rejection: Secondary | ICD-10-CM | POA: Diagnosis not present

## 2023-07-19 DIAGNOSIS — Z87891 Personal history of nicotine dependence: Secondary | ICD-10-CM | POA: Diagnosis not present

## 2023-07-19 DIAGNOSIS — R001 Bradycardia, unspecified: Secondary | ICD-10-CM | POA: Diagnosis not present

## 2023-07-19 DIAGNOSIS — Z9181 History of falling: Secondary | ICD-10-CM | POA: Diagnosis not present

## 2023-07-19 DIAGNOSIS — F0390 Unspecified dementia without behavioral disturbance: Secondary | ICD-10-CM | POA: Diagnosis not present

## 2023-07-19 DIAGNOSIS — Z556 Problems related to health literacy: Secondary | ICD-10-CM | POA: Diagnosis not present

## 2023-07-22 ENCOUNTER — Encounter: Payer: Self-pay | Admitting: Student

## 2023-07-22 DIAGNOSIS — R001 Bradycardia, unspecified: Secondary | ICD-10-CM | POA: Diagnosis not present

## 2023-07-22 DIAGNOSIS — D509 Iron deficiency anemia, unspecified: Secondary | ICD-10-CM | POA: Diagnosis not present

## 2023-07-22 DIAGNOSIS — N184 Chronic kidney disease, stage 4 (severe): Secondary | ICD-10-CM | POA: Diagnosis not present

## 2023-07-22 DIAGNOSIS — I129 Hypertensive chronic kidney disease with stage 1 through stage 4 chronic kidney disease, or unspecified chronic kidney disease: Secondary | ICD-10-CM | POA: Diagnosis not present

## 2023-07-22 DIAGNOSIS — D631 Anemia in chronic kidney disease: Secondary | ICD-10-CM | POA: Diagnosis not present

## 2023-07-22 DIAGNOSIS — F0390 Unspecified dementia without behavioral disturbance: Secondary | ICD-10-CM | POA: Diagnosis not present

## 2023-07-23 ENCOUNTER — Ambulatory Visit: Payer: Medicare Other | Admitting: Student

## 2023-07-23 NOTE — Progress Notes (Deleted)
    SUBJECTIVE:   CHIEF COMPLAINT / HPI:   HTN  Dizziness Episode of dizziness yesterday. Took her BP and it was 180/82.  Current meds include Hydralazine 25mg  bID and Amlodipine 10mg  daily. Not on ARB due to renal function.  Recent admission for IDA requiring 2 units PRBC. Had a normal EGD one month ago.   PERTINENT  PMH / PSH: ***  OBJECTIVE:   There were no vitals taken for this visit.  ***  ASSESSMENT/PLAN:   No problem-specific Assessment & Plan notes found for this encounter.     Eliezer Mccoy, MD [facility]

## 2023-07-24 ENCOUNTER — Other Ambulatory Visit: Payer: Self-pay

## 2023-07-24 DIAGNOSIS — F02B11 Dementia in other diseases classified elsewhere, moderate, with agitation: Secondary | ICD-10-CM

## 2023-07-24 MED ORDER — ESCITALOPRAM OXALATE 5 MG PO TABS
5.0000 mg | ORAL_TABLET | Freq: Every day | ORAL | 2 refills | Status: DC
Start: 2023-07-24 — End: 2023-10-27

## 2023-07-24 MED ORDER — AMLODIPINE BESYLATE 10 MG PO TABS: 10.0000 mg | ORAL_TABLET | Freq: Every day | ORAL | 0 refills | Status: DC

## 2023-07-24 MED ORDER — QUETIAPINE FUMARATE 25 MG PO TABS: 25.0000 mg | ORAL_TABLET | Freq: Every day | ORAL | 0 refills | Status: DC

## 2023-07-31 ENCOUNTER — Telehealth: Payer: Self-pay

## 2023-07-31 DIAGNOSIS — F0390 Unspecified dementia without behavioral disturbance: Secondary | ICD-10-CM | POA: Diagnosis not present

## 2023-07-31 DIAGNOSIS — D509 Iron deficiency anemia, unspecified: Secondary | ICD-10-CM | POA: Diagnosis not present

## 2023-07-31 DIAGNOSIS — I129 Hypertensive chronic kidney disease with stage 1 through stage 4 chronic kidney disease, or unspecified chronic kidney disease: Secondary | ICD-10-CM | POA: Diagnosis not present

## 2023-07-31 DIAGNOSIS — R001 Bradycardia, unspecified: Secondary | ICD-10-CM | POA: Diagnosis not present

## 2023-07-31 DIAGNOSIS — D631 Anemia in chronic kidney disease: Secondary | ICD-10-CM | POA: Diagnosis not present

## 2023-07-31 DIAGNOSIS — N184 Chronic kidney disease, stage 4 (severe): Secondary | ICD-10-CM | POA: Diagnosis not present

## 2023-07-31 NOTE — Telephone Encounter (Signed)
Received call from Arielle with [facility] regarding BP.   She reports that patient has had chronically high BP for the last several weeks. Readings have ranged from the 170's-190's systolic and 80's diastolic.   BP at visit today was 210/90. Arielle reports that patient is completely asymptomatic.   Spoke with granddaughter. Scheduled appointment for tomorrow at 2:30. We did not have any availability today and granddaughter requested afternoon for tomorrow.  Provided with strict ED precautions.   Veronda Prude, RN

## 2023-07-31 NOTE — Telephone Encounter (Signed)
Spoke with Dr. Elliot Gurney regarding elevated BP. Agreed that patient needs BP evaluation. Patient to be seen tomorrow, no further recommendations at this time.   ED precautions discussed with patient's granddaughter during previous conversation.   Veronda Prude, RN

## 2023-08-01 ENCOUNTER — Ambulatory Visit (INDEPENDENT_AMBULATORY_CARE_PROVIDER_SITE_OTHER): Payer: Medicare Other | Admitting: Family Medicine

## 2023-08-01 VITALS — BP 156/70 | HR 75 | Ht 67.72 in | Wt 148.8 lb

## 2023-08-01 DIAGNOSIS — I10 Essential (primary) hypertension: Secondary | ICD-10-CM | POA: Diagnosis not present

## 2023-08-01 DIAGNOSIS — R011 Cardiac murmur, unspecified: Secondary | ICD-10-CM

## 2023-08-01 MED ORDER — HYDRALAZINE HCL 25 MG PO TABS
25.0000 mg | ORAL_TABLET | Freq: Three times a day (TID) | ORAL | 1 refills | Status: AC
Start: 2023-08-01 — End: ?

## 2023-08-01 NOTE — Assessment & Plan Note (Addendum)
Hypertension poorly controlled, however also with wide pulse pressure likely secondary to arterial stenosis.  Must use caution with antihypertensives to avoid diastolic hypotension.  Noted concerning dizziness and blurred vision.  No concern for hypertensive crisis at this time.  Given benign physical exam, no concern for stroke or intracranial hypertension/hemorrhage.  Murmur is previously known and documented, no evidence of volume overload or heart failure.  Patient at baseline. -Increased hydralazine 25 mg to TID from BID -Instructed daughter to use home blood pressure cuff daily and keep accurate log -Follow-up with PCP in 2 to 3 weeks -BMP

## 2023-08-01 NOTE — Patient Instructions (Addendum)
It was great to see you today! Thank you for choosing [facility] for your primary care.  Today we addressed: 1. High blood pressure: We are increasing your hydralazine to 3 times a day.  Please take it as prescribed and do daily blood pressures.  Please continue to keep a log of the blood pressures as before and follow-up with your primary care doctor.  If the dizziness worsens or if Harlowe feels like she is going to fall often, please call immediately and hold the blood pressure medicine.  We are checking some labs today, including a metabolic panel.  You will get a MyChart message or a letter if results are normal. Otherwise, you will get a call from [place].  You should return to our clinic to see your Primary Care doctor in 2-3 weeks.  Thank you for coming to see [place] at [facility] and for the opportunity to care for you! Sadrac Zeoli, MD 08/01/2023, 3:26 PM

## 2023-08-01 NOTE — Progress Notes (Cosign Needed Addendum)
   SUBJECTIVE:   CHIEF COMPLAINT / HPI:  Megan Stevenson is a 82 y.o. female with a pertinent past medical history of CKD stage 4, HTN, dementia presenting to the clinic for blood pressure follow up after concerning reading of 210/90 at home yesterday.  Hypertension BP: (!) 156/70 today on recheck. Home medications include: Amlodipine 10 mg QHS and hydralazine 25 mg BID. She endorses taking these medications as prescribed. Does check blood pressure at home.  On 8/22, called clinic to report recent readings with SBP 170-190 and DBP 70-80, with 8/22 reading of 210/90.  Brings BP log today, consistent with reports on phone. Patient has had a BMP in the past 1 year. Denies headaches, chest pain. Endorses blurred vision, dyspnea with exertion, and dizziness with standing for the last few months. Daughter thinks she looked extra dizzy one time when she stood up quickly. Has been doing PT for months, but last week had to cancel PT for the first time because of dizziness. Drinking water well and eating somewhat poorly, but with Ensures to supplement.  Most recent creatinine trend:  Lab Results  Component Value Date   CREATININE 3.02 (H) 06/15/2023   CREATININE 3.35 (H) 06/14/2023   CREATININE 3.08 (H) 06/13/2023    PERTINENT PMH / PSH: CKD stage 4, HTN, dementia   OBJECTIVE:   BP (!) 156/70   Pulse 75   Ht 5' 7.72" (1.72 m)   Wt 148 lb 12.8 oz (67.5 kg)   SpO2 100%   BMI 22.81 kg/m   General: Age-appropriate, resting comfortably in chair, NAD, alert and at baseline. HEENT:  Head: Normocephalic, atraumatic. Eyes: PERRLA. No conjunctival erythema or scleral injections. Fundoscopic exam limited by constriction of pupils, but did not observe vascular changes, cotton-wools spots, exudates, optic disk edema. Cardiovascular: Regular rate and rhythm. Normal S1/S2. 3/6 systolic ejection murmur. No rubs or gallops appreciated. 2+ radial pulses.  No JVD. Pulmonary: Clear bilaterally to  ascultation. No increased WOB, no accessory muscle usage. No wheezes, crackles, or rhonchi. Extremities: No peripheral edema bilaterally. Capillary refill <2 seconds.  ASSESSMENT/PLAN:   Hypertension Hypertension poorly controlled, however also with wide pulse pressure likely secondary to arterial stenosis.  Must use caution with antihypertensives to avoid diastolic hypotension.  Noted concerning dizziness and blurred vision.  No concern for hypertensive crisis at this time.  Given benign physical exam, no concern for stroke or intracranial hypertension/hemorrhage.  Murmur is previously known and documented, no evidence of volume overload or heart failure.  Patient at baseline. -Increased hydralazine 25 mg to TID from BID -Instructed daughter to use home blood pressure cuff daily and keep accurate log -Follow-up with PCP in 2 to 3 weeks -BMP  Return in about 3 weeks (around 08/22/2023) for Blood pressure follow up.  Monigue Spraggins Sharion Dove, MD [facility]

## 2023-08-02 LAB — BASIC METABOLIC PANEL
BUN/Creatinine Ratio: 16 (ref 12–28)
BUN: 45 mg/dL — ABNORMAL HIGH (ref 8–27)
CO2: 19 mmol/L — ABNORMAL LOW (ref 20–29)
Calcium: 9.7 mg/dL (ref 8.7–10.3)
Chloride: 109 mmol/L — ABNORMAL HIGH (ref 96–106)
Creatinine, Ser: 2.81 mg/dL — ABNORMAL HIGH (ref 0.57–1.00)
Glucose: 89 mg/dL (ref 70–99)
Potassium: 4.9 mmol/L (ref 3.5–5.2)
Sodium: 144 mmol/L (ref 134–144)
eGFR: 16 mL/min/{1.73_m2} — ABNORMAL LOW (ref >59)

## 2023-08-04 NOTE — Addendum Note (Signed)
Addended byNelia Shi on: 08/04/2023 10:41 PM   Modules accepted: Orders

## 2023-08-04 NOTE — Assessment & Plan Note (Addendum)
Patient does have a murmur noted as far back as 2008 on chart review.  On physical exam, quality is consistent with aortic stenosis, though 2019 echocardiogram was unrevealing.  AS could be contributing to dizziness if severe. - TTE and f/u results - Cardiology referral

## 2023-08-05 ENCOUNTER — Telehealth: Payer: Self-pay

## 2023-08-05 NOTE — Telephone Encounter (Signed)
Called patient to inform her of upcoming appointment.   Patient's granddaughter Rivka Safer stated that she has [facility] and has already seen the  appointment.  Glennie Hawk, CMA

## 2023-08-06 DIAGNOSIS — I129 Hypertensive chronic kidney disease with stage 1 through stage 4 chronic kidney disease, or unspecified chronic kidney disease: Secondary | ICD-10-CM | POA: Diagnosis not present

## 2023-08-06 DIAGNOSIS — N184 Chronic kidney disease, stage 4 (severe): Secondary | ICD-10-CM | POA: Diagnosis not present

## 2023-08-06 DIAGNOSIS — F0390 Unspecified dementia without behavioral disturbance: Secondary | ICD-10-CM | POA: Diagnosis not present

## 2023-08-06 DIAGNOSIS — D631 Anemia in chronic kidney disease: Secondary | ICD-10-CM | POA: Diagnosis not present

## 2023-08-06 DIAGNOSIS — D509 Iron deficiency anemia, unspecified: Secondary | ICD-10-CM | POA: Diagnosis not present

## 2023-08-06 DIAGNOSIS — R001 Bradycardia, unspecified: Secondary | ICD-10-CM | POA: Diagnosis not present

## 2023-08-07 ENCOUNTER — Telehealth: Payer: Self-pay | Admitting: Student

## 2023-08-07 NOTE — Telephone Encounter (Signed)
Patient's granddaughter dropped off [employer] for to be completed. Last DOS was 08/01/23.Placed in [employer].

## 2023-08-08 DIAGNOSIS — D631 Anemia in chronic kidney disease: Secondary | ICD-10-CM | POA: Diagnosis not present

## 2023-08-08 DIAGNOSIS — N184 Chronic kidney disease, stage 4 (severe): Secondary | ICD-10-CM | POA: Diagnosis not present

## 2023-08-08 DIAGNOSIS — I129 Hypertensive chronic kidney disease with stage 1 through stage 4 chronic kidney disease, or unspecified chronic kidney disease: Secondary | ICD-10-CM | POA: Diagnosis not present

## 2023-08-08 NOTE — Telephone Encounter (Signed)
Reviewed paperwork and placed in PCP's box for completion.  .Michelle R Simpson, CMA' 

## 2023-08-12 ENCOUNTER — Other Ambulatory Visit: Payer: Self-pay | Admitting: Student

## 2023-08-12 ENCOUNTER — Encounter (HOSPITAL_COMMUNITY): Payer: Self-pay

## 2023-08-12 ENCOUNTER — Ambulatory Visit (HOSPITAL_COMMUNITY): Admission: RE | Admit: 2023-08-12 | Payer: Medicare Other | Source: Ambulatory Visit

## 2023-08-15 ENCOUNTER — Telehealth: Payer: Self-pay | Admitting: Student

## 2023-08-15 DIAGNOSIS — I129 Hypertensive chronic kidney disease with stage 1 through stage 4 chronic kidney disease, or unspecified chronic kidney disease: Secondary | ICD-10-CM | POA: Diagnosis not present

## 2023-08-15 DIAGNOSIS — F0390 Unspecified dementia without behavioral disturbance: Secondary | ICD-10-CM | POA: Diagnosis not present

## 2023-08-15 DIAGNOSIS — D509 Iron deficiency anemia, unspecified: Secondary | ICD-10-CM | POA: Diagnosis not present

## 2023-08-15 DIAGNOSIS — R001 Bradycardia, unspecified: Secondary | ICD-10-CM | POA: Diagnosis not present

## 2023-08-15 DIAGNOSIS — N184 Chronic kidney disease, stage 4 (severe): Secondary | ICD-10-CM | POA: Diagnosis not present

## 2023-08-15 DIAGNOSIS — D631 Anemia in chronic kidney disease: Secondary | ICD-10-CM | POA: Diagnosis not present

## 2023-08-15 NOTE — Telephone Encounter (Signed)
Patient's granddaughter came in to cancel her appt for Monday 9/9. Wanted to let her doctor know that she is refusing to leave the house, and she is acting out again. Granddaughter stated that she will send current bp via MyChart. Asks is the doctor could please give them a call. Best contact is for granddaughter [name], [phone].

## 2023-08-17 ENCOUNTER — Other Ambulatory Visit: Payer: Self-pay | Admitting: Student

## 2023-08-18 ENCOUNTER — Ambulatory Visit: Payer: Medicare Other | Admitting: Student

## 2023-08-18 MED ORDER — QUETIAPINE FUMARATE 25 MG PO TABS: 25.0000 mg | ORAL_TABLET | Freq: Every day | ORAL | 0 refills | Status: DC

## 2023-08-18 NOTE — Telephone Encounter (Signed)
Patient's granddaughter called, LVM and informed that forms are ready for pick up. Copy made and placed in batch scanning. Original placed at front desk for pick up.   Talbot Grumbling, RN

## 2023-08-25 ENCOUNTER — Other Ambulatory Visit (HOSPITAL_COMMUNITY): Payer: Self-pay | Admitting: *Deleted

## 2023-08-27 ENCOUNTER — Inpatient Hospital Stay (HOSPITAL_COMMUNITY)
Admission: RE | Admit: 2023-08-27 | Discharge: 2023-08-27 | Disposition: A | Discharge status: Home / Self Care | Payer: Medicare Other | Source: Ambulatory Visit | Attending: Nephrology | Admitting: Nephrology

## 2023-08-27 ENCOUNTER — Encounter (HOSPITAL_COMMUNITY): Payer: Self-pay

## 2023-09-03 ENCOUNTER — Encounter (HOSPITAL_COMMUNITY): Payer: Medicare Other

## 2023-09-03 ENCOUNTER — Other Ambulatory Visit: Payer: Self-pay

## 2023-09-03 ENCOUNTER — Encounter (HOSPITAL_COMMUNITY): Payer: Self-pay

## 2023-09-03 ENCOUNTER — Emergency Department (HOSPITAL_COMMUNITY)
Admission: EM | Admit: 2023-09-03 | Discharge: 2023-09-03 | Disposition: A | Discharge status: Home / Self Care | Payer: Medicare Other | Attending: Emergency Medicine | Admitting: Emergency Medicine

## 2023-09-03 DIAGNOSIS — R531 Weakness: Secondary | ICD-10-CM | POA: Insufficient documentation

## 2023-09-03 DIAGNOSIS — R001 Bradycardia, unspecified: Secondary | ICD-10-CM | POA: Diagnosis not present

## 2023-09-03 DIAGNOSIS — I129 Hypertensive chronic kidney disease with stage 1 through stage 4 chronic kidney disease, or unspecified chronic kidney disease: Secondary | ICD-10-CM | POA: Insufficient documentation

## 2023-09-03 DIAGNOSIS — E861 Hypovolemia: Secondary | ICD-10-CM | POA: Diagnosis not present

## 2023-09-03 DIAGNOSIS — R571 Hypovolemic shock: Secondary | ICD-10-CM | POA: Diagnosis not present

## 2023-09-03 DIAGNOSIS — F0390 Unspecified dementia without behavioral disturbance: Secondary | ICD-10-CM | POA: Diagnosis not present

## 2023-09-03 DIAGNOSIS — I10 Essential (primary) hypertension: Secondary | ICD-10-CM | POA: Diagnosis not present

## 2023-09-03 DIAGNOSIS — R799 Abnormal finding of blood chemistry, unspecified: Secondary | ICD-10-CM | POA: Diagnosis present

## 2023-09-03 DIAGNOSIS — Z79899 Other long term (current) drug therapy: Secondary | ICD-10-CM | POA: Diagnosis not present

## 2023-09-03 DIAGNOSIS — D631 Anemia in chronic kidney disease: Secondary | ICD-10-CM | POA: Diagnosis not present

## 2023-09-03 DIAGNOSIS — N189 Chronic kidney disease, unspecified: Secondary | ICD-10-CM | POA: Diagnosis not present

## 2023-09-03 DIAGNOSIS — D649 Anemia, unspecified: Secondary | ICD-10-CM | POA: Diagnosis not present

## 2023-09-03 LAB — CBC WITH DIFFERENTIAL/PLATELET
Abs Immature Granulocytes: 0.02 10*3/uL (ref 0.00–0.07)
Basophils Absolute: 0 10*3/uL (ref 0.0–0.1)
Basophils Relative: 1 %
Eosinophils Absolute: 0.1 10*3/uL (ref 0.0–0.5)
Eosinophils Relative: 2 %
HCT: 26.6 % — ABNORMAL LOW (ref 36.0–46.0)
Hemoglobin: 7.5 g/dL — ABNORMAL LOW (ref 12.0–15.0)
Immature Granulocytes: 0 %
Lymphocytes Relative: 16 %
Lymphs Abs: 0.8 10*3/uL (ref 0.7–4.0)
MCH: 22.4 pg — ABNORMAL LOW (ref 26.0–34.0)
MCHC: 28.2 g/dL — ABNORMAL LOW (ref 30.0–36.0)
MCV: 79.4 fL — ABNORMAL LOW (ref 80.0–100.0)
Monocytes Absolute: 0.4 10*3/uL (ref 0.1–1.0)
Monocytes Relative: 9 %
Neutro Abs: 3.4 10*3/uL (ref 1.7–7.7)
Neutrophils Relative %: 72 %
Platelets: 308 10*3/uL (ref 150–400)
RBC: 3.35 MIL/uL — ABNORMAL LOW (ref 3.87–5.11)
RDW: 18.8 % — ABNORMAL HIGH (ref 11.5–15.5)
WBC: 4.7 10*3/uL (ref 4.0–10.5)
nRBC: 0 % (ref 0.0–0.2)

## 2023-09-03 LAB — COMPREHENSIVE METABOLIC PANEL
ALT: 24 U/L (ref 0–44)
AST: 20 U/L (ref 15–41)
Albumin: 3.4 g/dL — ABNORMAL LOW (ref 3.5–5.0)
Alkaline Phosphatase: 70 U/L (ref 38–126)
Anion gap: 8 (ref 5–15)
BUN: 32 mg/dL — ABNORMAL HIGH (ref 8–23)
CO2: 19 mmol/L — ABNORMAL LOW (ref 22–32)
Calcium: 9.1 mg/dL (ref 8.9–10.3)
Chloride: 116 mmol/L — ABNORMAL HIGH (ref 98–111)
Creatinine, Ser: 2.54 mg/dL — ABNORMAL HIGH (ref 0.44–1.00)
GFR, Estimated: 18 mL/min — ABNORMAL LOW (ref >60)
Glucose, Bld: 89 mg/dL (ref 70–99)
Potassium: 4.4 mmol/L (ref 3.5–5.1)
Sodium: 143 mmol/L (ref 135–145)
Total Bilirubin: 0.3 mg/dL (ref 0.3–1.2)
Total Protein: 6.2 g/dL — ABNORMAL LOW (ref 6.5–8.1)

## 2023-09-03 LAB — TYPE AND SCREEN
ABO/RH(D): A POS
Antibody Screen: NEGATIVE

## 2023-09-03 NOTE — Discharge Instructions (Signed)
You have been seen today for your complaint of weakness, anemia. Your lab work showed a stable hemoglobin of 7.5. Follow up with: your PCP and kidney specialist as soon as possible Please seek immediate medical care if you develop any of the following symptoms: You develop sudden weakness, especially on one side of your face or body. You have chest pain. You have trouble breathing or shortness of breath. You have problems with your vision. You have trouble talking or swallowing. You have trouble standing or walking. You are light-headed or lose consciousness. At this time there does not appear to be the presence of an emergent medical condition, however there is always the potential for conditions to change. Please read and follow the below instructions.  Do not take your medicine if  develop an itchy rash, swelling in your mouth or lips, or difficulty breathing; call 911 and seek immediate emergency medical attention if this occurs.  You may review your lab tests and imaging results in their entirety on your MyChart account.  Please discuss all results of fully with your primary care provider and other specialist at your follow-up visit.  Note: Portions of this text may have been transcribed using voice recognition software. Every effort was made to ensure accuracy; however, inadvertent computerized transcription errors may still be present.

## 2023-09-03 NOTE — ED Provider Notes (Signed)
[facility] AT [facility] Provider Note   CSN: [id number] Arrival date & time: 09/03/23  1233     History  Chief Complaint  Patient presents with   Abnormal Lab    Megan Stevenson is a 82 y.o. female.  With a history of anemia secondary to CKD, hypertension, dementia presenting for evaluation of abnormal lab results.  Patient states she feels weak and short of breath at baseline.  No recent change to this.  No abdominal pain, nausea, vomiting, vaginal bleeding, melena, hematochezia.  No chest pain or coughing.  She states she eats 1 meal per day.  No recent change to this.  No dysuria, frequency or urgency.  Granddaughter at bedside reports that patient was told she had a hemoglobin of 7.2 approximately 2 weeks ago.  Was encouraged to receive a blood transfusion at that time.  Since then, patient has been refusing to follow-up with her office visits, stating that she just does not want to.   Abnormal Lab      Home Medications Prior to Admission medications   Medication Sig Start Date End Date Taking? Authorizing Provider  amLODipine (NORVASC) 10 MG tablet TAKE 1 TABLET(10 MG) BY MOUTH DAILY 08/14/23   Alicia Amel, MD  atorvastatin (LIPITOR) 10 MG tablet TAKE 1 TABLET(10 MG) BY MOUTH AT BEDTIME 02/03/23   Maury Dus, MD  diclofenac Sodium (VOLTAREN) 1 % GEL Apply to right knee 2-3 times daily as needed for pain 08/07/21   Maury Dus, MD  donepezil (ARICEPT) 10 MG tablet TAKE 1 TABLET(10 MG) BY MOUTH DAILY 10/17/22   Valetta Close, MD  escitalopram (LEXAPRO) 5 MG tablet Take 1 tablet (5 mg total) by mouth daily. 07/24/23   Jerre Simon, MD  hydrALAZINE (APRESOLINE) 25 MG tablet Take 1 tablet (25 mg total) by mouth 3 (three) times daily. 08/01/23   Shitarev, Dimitry, MD  Nutritional Supplements (ENSURE ACTIVE HIGH PROTEIN) LIQD Take 1 Box by mouth 2 (two) times daily. 06/13/23   Jerre Simon, MD  QUEtiapine (SEROQUEL) 25 MG tablet Take 1  tablet (25 mg total) by mouth at bedtime. 08/18/23   Jerre Simon, MD      Allergies    Nsaids, Lasix [furosemide], Lotensin [benazepril], and Tenormin [atenolol]    Review of Systems   Review of Systems  Respiratory:  Positive for shortness of breath.   Neurological:  Positive for weakness.    Physical Exam Updated Vital Signs BP (!) 165/59   Pulse (!) 59   Temp 99.3 F (37.4 C) (Oral)   Resp 10   Ht 5\' 7"  (1.702 m)   Wt 67.1 kg   SpO2 100%   BMI 23.18 kg/m  Physical Exam Vitals and nursing note reviewed.  Constitutional:      General: She is not in acute distress.    Appearance: Normal appearance. She is normal weight. She is not ill-appearing.     Comments: Resting comfortably in bed  HENT:     Head: Normocephalic and atraumatic.     Mouth/Throat:     Mouth: Mucous membranes are moist.     Pharynx: Oropharynx is clear.  Eyes:     Pupils: Pupils are equal, round, and reactive to light.     Comments: Conjunctival pallor  Cardiovascular:     Rate and Rhythm: Bradycardia present.  Pulmonary:     Effort: Pulmonary effort is normal. No respiratory distress.     Breath sounds: No wheezing, rhonchi or rales.  Abdominal:     General: Abdomen is flat.     Tenderness: There is no abdominal tenderness. There is no guarding.  Musculoskeletal:        General: Normal range of motion.     Cervical back: Neck supple.  Skin:    General: Skin is warm and dry.  Neurological:     Mental Status: She is alert and oriented to person, place, and time.     Comments: Alert, oriented to person, place and self  Psychiatric:        Mood and Affect: Mood normal.        Behavior: Behavior normal.     ED Results / Procedures / Treatments   Labs (all labs ordered are listed, but only abnormal results are displayed) Labs Reviewed  CBC WITH DIFFERENTIAL/PLATELET - Abnormal; Notable for the following components:      Result Value   RBC 3.35 (*)    Hemoglobin 7.5 (*)    HCT 26.6 (*)     MCV 79.4 (*)    MCH 22.4 (*)    MCHC 28.2 (*)    RDW 18.8 (*)    All other components within normal limits  COMPREHENSIVE METABOLIC PANEL - Abnormal; Notable for the following components:   Chloride 116 (*)    CO2 19 (*)    BUN 32 (*)    Creatinine, Ser 2.54 (*)    Total Protein 6.2 (*)    Albumin 3.4 (*)    GFR, Estimated 18 (*)    All other components within normal limits  URINALYSIS, W/ REFLEX TO CULTURE (INFECTION SUSPECTED)  TYPE AND SCREEN    EKG None  Radiology No results found.  Procedures Procedures    Medications Ordered in ED Medications - No data to display  ED Course/ Medical Decision Making/ A&P Clinical Course as of 09/03/23 1502  Wed Sep 03, 2023  1452 Spoke with granddaughter, [name], after confirming this was okay with the patient.  She states that it is just difficult for the patient to agree to go to her primary care appointments.  She is agreeable with plan to discharge home with outpatient follow-up and will have her PCP call the patient to speak to them directly.  Granddaughter will pick up the patient in 45 minutes. [AS]    Clinical Course User Index [AS] Britanni Yarde, Edsel Petrin, PA-C                                 Medical Decision Making Amount and/or Complexity of Data Reviewed Labs: ordered.   This patient presents to the ED for concern of weakness, anemia, this involves an extensive number of treatment options, and is a complaint that carries with it a high risk of complications and morbidity. The differential diagnosis of weakness includes but is not limited to neurologic causes (GBS, myasthenia gravis, CVA, MS, ALS, transverse myelitis, spinal cord injury, CVA, botulism, ) and other causes: ACS, Arrhythmia, syncope, orthostatic hypotension, sepsis, hypoglycemia, electrolyte disturbance, hypothyroidism, respiratory failure, symptomatic anemia, dehydration, heat injury, polypharmacy, malignancy.   My initial workup includes labs,  EKG  Additional history obtained from: Nursing notes from this visit. Previous records within EMR system office visit with [facility], Dr. Marisue Humble.  He ordered Retacrit subcu dosing every 2 weeks.  Last office visit was 08/01/2023 Family granddaughter at bedside provides a portion of the history EMS provides a portion of the history  I  ordered, reviewed and interpreted labs which include: CBC, CMP, type and screen, urinalysis.  Anemia with a hemoglobin of 7.5.  This is near her baseline with reported hemoglobin of 7.2 2 weeks ago.  NAGMA with a bicarb of 19 likely due to CKD.  Stable creatinine of 2.54.  Cardiac Monitoring:  The patient was maintained on a cardiac monitor.  I personally viewed and interpreted the cardiac monitored which showed an underlying rhythm of: NSR  Afebrile, hemodynamically stable.  82 year old female presenting for evaluation of abnormal lab.  Per daughter, the patient's hemoglobin was 7.2 2 weeks ago.  She is to undergo biweekly injections with Retacrit for her anemia and is also scheduled to undergo an echocardiogram as she has some postural dizziness.  Over the past 2 weeks she has been unwilling to follow-up with these appointments which prompted the granddaughter to call EMS today.  Patient reports feeling generally weak and mildly short of breath which is her baseline.  No recent change to the symptoms.  No infectious symptoms.  No chest pain or cough.  Patient shows some confusion, however is oriented to person, self and place and can make her own medical decisions.  Patient does not want to be admitted to the hospital.  Her hemoglobin is 7.5 today which is an improvement from 2 weeks ago.  She denies any new source of bleeding.  Suspect her anemia is due to her CKD.  Spoke with granddaughter who appears to be Engineer, site of the patient's clinic appointments.  She is in agreement with this plan.  Gave patient return precautions.  Stable at discharge.  At this time  there does not appear to be any evidence of an acute emergency medical condition and the patient appears stable for discharge with appropriate outpatient follow up. Diagnosis was discussed with patient who verbalizes understanding of care plan and is agreeable to discharge. I have discussed return precautions with patient who verbalizes understanding. Patient encouraged to follow-up with their PCP within 1 week. All questions answered.  Patient's case discussed with Dr. Eloise Harman who agrees with plan to discharge with follow-up.   Note: Portions of this report may have been transcribed using voice recognition software. Every effort was made to ensure accuracy; however, inadvertent computerized transcription errors may still be present.        Final Clinical Impression(s) / ED Diagnoses Final diagnoses:  Weakness  Anemia due to chronic kidney disease, unspecified CKD stage    Rx / DC Orders ED Discharge Orders     None         Mora Bellman 09/03/23 1502    Rondel Baton, MD 09/05/23 [phone]

## 2023-09-03 NOTE — ED Triage Notes (Signed)
2 weeks ago kidney doctor called and reported she had a low hgb gbut patient has been refusing blood transfusion and echo  Patient has hx of dementia and CKD

## 2023-09-03 NOTE — ED Notes (Signed)
Granddaughter who is POA advised she would get patient as soon as possible. She is with son at appointment in [facility].

## 2023-09-09 ENCOUNTER — Emergency Department (HOSPITAL_COMMUNITY)
Admission: EM | Admit: 2023-09-09 | Discharge: 2023-09-10 | Disposition: A | Discharge status: Home / Self Care | Payer: Medicare Other | Attending: Emergency Medicine | Admitting: Emergency Medicine

## 2023-09-09 DIAGNOSIS — D649 Anemia, unspecified: Secondary | ICD-10-CM | POA: Diagnosis not present

## 2023-09-09 DIAGNOSIS — S3991XA Unspecified injury of abdomen, initial encounter: Secondary | ICD-10-CM | POA: Insufficient documentation

## 2023-09-09 DIAGNOSIS — W01198A Fall on same level from slipping, tripping and stumbling with subsequent striking against other object, initial encounter: Secondary | ICD-10-CM | POA: Diagnosis not present

## 2023-09-09 DIAGNOSIS — S32029A Unspecified fracture of second lumbar vertebra, initial encounter for closed fracture: Secondary | ICD-10-CM | POA: Insufficient documentation

## 2023-09-09 DIAGNOSIS — M8580 Other specified disorders of bone density and structure, unspecified site: Secondary | ICD-10-CM | POA: Diagnosis not present

## 2023-09-09 DIAGNOSIS — N189 Chronic kidney disease, unspecified: Secondary | ICD-10-CM | POA: Insufficient documentation

## 2023-09-09 DIAGNOSIS — Z79899 Other long term (current) drug therapy: Secondary | ICD-10-CM | POA: Insufficient documentation

## 2023-09-09 DIAGNOSIS — S32009A Unspecified fracture of unspecified lumbar vertebra, initial encounter for closed fracture: Secondary | ICD-10-CM

## 2023-09-09 DIAGNOSIS — W19XXXA Unspecified fall, initial encounter: Secondary | ICD-10-CM

## 2023-09-09 DIAGNOSIS — K5730 Diverticulosis of large intestine without perforation or abscess without bleeding: Secondary | ICD-10-CM | POA: Diagnosis not present

## 2023-09-09 DIAGNOSIS — S79912A Unspecified injury of left hip, initial encounter: Secondary | ICD-10-CM | POA: Diagnosis not present

## 2023-09-09 DIAGNOSIS — I6782 Cerebral ischemia: Secondary | ICD-10-CM | POA: Diagnosis not present

## 2023-09-09 DIAGNOSIS — J9811 Atelectasis: Secondary | ICD-10-CM | POA: Diagnosis not present

## 2023-09-09 DIAGNOSIS — S3992XA Unspecified injury of lower back, initial encounter: Secondary | ICD-10-CM | POA: Diagnosis present

## 2023-09-09 DIAGNOSIS — G319 Degenerative disease of nervous system, unspecified: Secondary | ICD-10-CM | POA: Diagnosis not present

## 2023-09-09 DIAGNOSIS — M160 Bilateral primary osteoarthritis of hip: Secondary | ICD-10-CM | POA: Diagnosis not present

## 2023-09-09 DIAGNOSIS — R109 Unspecified abdominal pain: Secondary | ICD-10-CM | POA: Diagnosis not present

## 2023-09-09 DIAGNOSIS — I10 Essential (primary) hypertension: Secondary | ICD-10-CM | POA: Diagnosis not present

## 2023-09-09 DIAGNOSIS — I129 Hypertensive chronic kidney disease with stage 1 through stage 4 chronic kidney disease, or unspecified chronic kidney disease: Secondary | ICD-10-CM | POA: Insufficient documentation

## 2023-09-09 DIAGNOSIS — Z043 Encounter for examination and observation following other accident: Secondary | ICD-10-CM | POA: Diagnosis not present

## 2023-09-09 DIAGNOSIS — S199XXA Unspecified injury of neck, initial encounter: Secondary | ICD-10-CM | POA: Diagnosis not present

## 2023-09-09 DIAGNOSIS — F0390 Unspecified dementia without behavioral disturbance: Secondary | ICD-10-CM | POA: Diagnosis not present

## 2023-09-09 DIAGNOSIS — M25552 Pain in left hip: Secondary | ICD-10-CM | POA: Diagnosis not present

## 2023-09-09 DIAGNOSIS — M47816 Spondylosis without myelopathy or radiculopathy, lumbar region: Secondary | ICD-10-CM | POA: Diagnosis not present

## 2023-09-09 DIAGNOSIS — I700 Atherosclerosis of aorta: Secondary | ICD-10-CM | POA: Diagnosis not present

## 2023-09-09 DIAGNOSIS — M48061 Spinal stenosis, lumbar region without neurogenic claudication: Secondary | ICD-10-CM | POA: Diagnosis not present

## 2023-09-09 DIAGNOSIS — S0990XA Unspecified injury of head, initial encounter: Secondary | ICD-10-CM | POA: Insufficient documentation

## 2023-09-09 DIAGNOSIS — M4807 Spinal stenosis, lumbosacral region: Secondary | ICD-10-CM | POA: Diagnosis not present

## 2023-09-09 NOTE — ED Triage Notes (Signed)
Patient BIB EMS from home with had a fall to floor, also had a fall this morning hit wall.C/O left flank pain. Denies hitting head, no LOC, and no blood thinners. VSS.

## 2023-09-10 ENCOUNTER — Emergency Department (HOSPITAL_COMMUNITY): Payer: Medicare Other

## 2023-09-10 ENCOUNTER — Other Ambulatory Visit: Payer: Self-pay

## 2023-09-10 ENCOUNTER — Encounter (HOSPITAL_COMMUNITY): Payer: Medicare Other

## 2023-09-10 DIAGNOSIS — Z043 Encounter for examination and observation following other accident: Secondary | ICD-10-CM | POA: Diagnosis not present

## 2023-09-10 DIAGNOSIS — S199XXA Unspecified injury of neck, initial encounter: Secondary | ICD-10-CM | POA: Diagnosis not present

## 2023-09-10 DIAGNOSIS — M48061 Spinal stenosis, lumbar region without neurogenic claudication: Secondary | ICD-10-CM | POA: Diagnosis not present

## 2023-09-10 DIAGNOSIS — I6782 Cerebral ischemia: Secondary | ICD-10-CM | POA: Diagnosis not present

## 2023-09-10 DIAGNOSIS — S0990XA Unspecified injury of head, initial encounter: Secondary | ICD-10-CM | POA: Diagnosis not present

## 2023-09-10 DIAGNOSIS — G319 Degenerative disease of nervous system, unspecified: Secondary | ICD-10-CM | POA: Diagnosis not present

## 2023-09-10 DIAGNOSIS — M4807 Spinal stenosis, lumbosacral region: Secondary | ICD-10-CM | POA: Diagnosis not present

## 2023-09-10 DIAGNOSIS — I700 Atherosclerosis of aorta: Secondary | ICD-10-CM | POA: Diagnosis not present

## 2023-09-10 DIAGNOSIS — M47816 Spondylosis without myelopathy or radiculopathy, lumbar region: Secondary | ICD-10-CM | POA: Diagnosis not present

## 2023-09-10 DIAGNOSIS — J9811 Atelectasis: Secondary | ICD-10-CM | POA: Diagnosis not present

## 2023-09-10 DIAGNOSIS — M8580 Other specified disorders of bone density and structure, unspecified site: Secondary | ICD-10-CM | POA: Diagnosis not present

## 2023-09-10 DIAGNOSIS — S32029A Unspecified fracture of second lumbar vertebra, initial encounter for closed fracture: Secondary | ICD-10-CM | POA: Diagnosis not present

## 2023-09-10 DIAGNOSIS — S79912A Unspecified injury of left hip, initial encounter: Secondary | ICD-10-CM | POA: Diagnosis not present

## 2023-09-10 DIAGNOSIS — M25552 Pain in left hip: Secondary | ICD-10-CM | POA: Diagnosis not present

## 2023-09-10 DIAGNOSIS — K5730 Diverticulosis of large intestine without perforation or abscess without bleeding: Secondary | ICD-10-CM | POA: Diagnosis not present

## 2023-09-10 DIAGNOSIS — R109 Unspecified abdominal pain: Secondary | ICD-10-CM | POA: Diagnosis not present

## 2023-09-10 DIAGNOSIS — M160 Bilateral primary osteoarthritis of hip: Secondary | ICD-10-CM | POA: Diagnosis not present

## 2023-09-10 LAB — COMPREHENSIVE METABOLIC PANEL
ALT: 25 U/L (ref 0–44)
AST: 15 U/L (ref 15–41)
Albumin: 3.5 g/dL (ref 3.5–5.0)
Alkaline Phosphatase: 65 U/L (ref 38–126)
Anion gap: 14 (ref 5–15)
BUN: 36 mg/dL — ABNORMAL HIGH (ref 8–23)
CO2: 21 mmol/L — ABNORMAL LOW (ref 22–32)
Calcium: 9.5 mg/dL (ref 8.9–10.3)
Chloride: 110 mmol/L (ref 98–111)
Creatinine, Ser: 2.66 mg/dL — ABNORMAL HIGH (ref 0.44–1.00)
GFR, Estimated: 17 mL/min — ABNORMAL LOW (ref >60)
Glucose, Bld: 103 mg/dL — ABNORMAL HIGH (ref 70–99)
Potassium: 4.5 mmol/L (ref 3.5–5.1)
Sodium: 145 mmol/L (ref 135–145)
Total Bilirubin: 0.4 mg/dL (ref 0.3–1.2)
Total Protein: 6.2 g/dL — ABNORMAL LOW (ref 6.5–8.1)

## 2023-09-10 LAB — URINALYSIS, W/ REFLEX TO CULTURE (INFECTION SUSPECTED)
Bilirubin Urine: NEGATIVE
Glucose, UA: NEGATIVE mg/dL
Ketones, ur: NEGATIVE mg/dL
Nitrite: NEGATIVE
Protein, ur: 100 mg/dL — AB
Specific Gravity, Urine: 1.006 (ref 1.005–1.030)
WBC, UA: >50 WBC/hpf (ref 0–5)
pH: 6 (ref 5.0–8.0)

## 2023-09-10 LAB — CBC WITH DIFFERENTIAL/PLATELET
Abs Immature Granulocytes: 0.03 10*3/uL (ref 0.00–0.07)
Basophils Absolute: 0 10*3/uL (ref 0.0–0.1)
Basophils Relative: 0 %
Eosinophils Absolute: 0.1 10*3/uL (ref 0.0–0.5)
Eosinophils Relative: 1 %
HCT: 24.2 % — ABNORMAL LOW (ref 36.0–46.0)
Hemoglobin: 7 g/dL — ABNORMAL LOW (ref 12.0–15.0)
Immature Granulocytes: 0 %
Lymphocytes Relative: 9 %
Lymphs Abs: 0.7 10*3/uL (ref 0.7–4.0)
MCH: 22.4 pg — ABNORMAL LOW (ref 26.0–34.0)
MCHC: 28.9 g/dL — ABNORMAL LOW (ref 30.0–36.0)
MCV: 77.6 fL — ABNORMAL LOW (ref 80.0–100.0)
Monocytes Absolute: 0.6 10*3/uL (ref 0.1–1.0)
Monocytes Relative: 9 %
Neutro Abs: 5.8 10*3/uL (ref 1.7–7.7)
Neutrophils Relative %: 81 %
Platelets: 294 10*3/uL (ref 150–400)
RBC: 3.12 MIL/uL — ABNORMAL LOW (ref 3.87–5.11)
RDW: 18.4 % — ABNORMAL HIGH (ref 11.5–15.5)
WBC: 7.2 10*3/uL (ref 4.0–10.5)
nRBC: 0 % (ref 0.0–0.2)

## 2023-09-10 LAB — TROPONIN I (HIGH SENSITIVITY)
Troponin I (High Sensitivity): 20 ng/L — ABNORMAL HIGH (ref <18)
Troponin I (High Sensitivity): 7 ng/L (ref <18)

## 2023-09-10 LAB — IRON AND TIBC
Iron: 23 ug/dL — ABNORMAL LOW (ref 28–170)
Saturation Ratios: 7 % — ABNORMAL LOW (ref 10.4–31.8)
TIBC: 333 ug/dL (ref 250–450)
UIBC: 310 ug/dL

## 2023-09-10 LAB — RETICULOCYTES
Immature Retic Fract: 10.7 % (ref 2.3–15.9)
RBC.: 2.85 MIL/uL — ABNORMAL LOW (ref 3.87–5.11)
Retic Count, Absolute: 48.7 10*3/uL (ref 19.0–186.0)
Retic Ct Pct: 1.7 % (ref 0.4–3.1)

## 2023-09-10 LAB — CK: Total CK: 35 U/L — ABNORMAL LOW (ref 38–234)

## 2023-09-10 LAB — PREPARE RBC (CROSSMATCH)

## 2023-09-10 LAB — FOLATE: Folate: 20.8 ng/mL (ref >5.9)

## 2023-09-10 LAB — FERRITIN: Ferritin: 8 ng/mL — ABNORMAL LOW (ref 11–307)

## 2023-09-10 LAB — LIPASE, BLOOD: Lipase: 67 U/L — ABNORMAL HIGH (ref 11–51)

## 2023-09-10 LAB — VITAMIN B12: Vitamin B-12: 189 pg/mL (ref 180–914)

## 2023-09-10 MED ORDER — SODIUM CHLORIDE 0.9 % IV BOLUS
1000.0000 mL | Freq: Once | INTRAVENOUS | Status: AC
  Administered 2023-09-10: 1000 mL via INTRAVENOUS

## 2023-09-10 MED ORDER — HYDROMORPHONE HCL 1 MG/ML IJ SOLN
0.5000 mg | Freq: Once | INTRAMUSCULAR | Status: AC
  Administered 2023-09-10: 0.5 mg via INTRAVENOUS
  Filled 2023-09-10: qty 1

## 2023-09-10 MED ORDER — HYDRALAZINE HCL 25 MG PO TABS
25.0000 mg | ORAL_TABLET | Freq: Once | ORAL | Status: AC
  Administered 2023-09-10: 25 mg via ORAL
  Filled 2023-09-10: qty 1

## 2023-09-10 MED ORDER — SODIUM CHLORIDE 0.9% IV SOLUTION: Freq: Once | INTRAVENOUS | Status: AC

## 2023-09-10 MED ORDER — ONDANSETRON HCL 4 MG/2ML IJ SOLN
4.0000 mg | Freq: Once | INTRAMUSCULAR | Status: AC
  Administered 2023-09-10: 4 mg via INTRAVENOUS
  Filled 2023-09-10: qty 2

## 2023-09-10 NOTE — ED Notes (Signed)
Patient able to stand and move around without any pain or discomfort.

## 2023-09-10 NOTE — ED Provider Notes (Cosign Needed Addendum)
Megan Stevenson   CSN: [id number] Arrival date & time: 09/09/23  2352     History  Chief Complaint  Patient presents with   Marletta Lor    Megan Stevenson is a 82 y.o. female.  HPI   Patient with medical history including hypertension, CKD, presenting with complaints of a fall.  Patient states that she got up today felt lightheaded and then her left knee gave out causing her to fall.  She states she is unclear if she hit her head but does not think she lost consciousness, she is not on any anticoag's.  States that she has pain mainly in her left hip, and left abdomen, denies any pain in her head, neck, back, chest, no shortness of breath, pain in the upper and/or lower extremities.  States that she was able to get herself up off the floor and back in the bed.  She states she was not on the ground very long.  She states that she typically feels lightheaded dizziness when she goes from sitting to standing, she denies any heart palpitations chest pain, dizziness.  She has no other complaints.  Patient's granddaughter at bedside, states that patient actually had a fall on Sunday, this was unwitnessed, and then Tuesday she came over to give patient her medications, this around 8 PM, states that she got a call around 51 that she had a fall, daughter states patient has  dementia and does not remember everything, she states the patient not had any fevers chills cough congestion stomach pains nausea or vomiting denies any bloody stools or dark tarry stools has history of anemia.  Home Medications Prior to Admission medications   Medication Sig Start Date End Date Taking? Authorizing Provider  amLODipine (NORVASC) 10 MG tablet TAKE 1 TABLET(10 MG) BY MOUTH DAILY 08/14/23   Alicia Amel, MD  atorvastatin (LIPITOR) 10 MG tablet TAKE 1 TABLET(10 MG) BY MOUTH AT BEDTIME 02/03/23   Maury Dus, MD  diclofenac Sodium (VOLTAREN) 1 % GEL Apply to right  knee 2-3 times daily as needed for pain 08/07/21   Maury Dus, MD  donepezil (ARICEPT) 10 MG tablet TAKE 1 TABLET(10 MG) BY MOUTH DAILY 10/17/22   Valetta Close, MD  escitalopram (LEXAPRO) 5 MG tablet Take 1 tablet (5 mg total) by mouth daily. 07/24/23   Jerre Simon, MD  hydrALAZINE (APRESOLINE) 25 MG tablet Take 1 tablet (25 mg total) by mouth 3 (three) times daily. 08/01/23   Shitarev, Dimitry, MD  Nutritional Supplements (ENSURE ACTIVE HIGH PROTEIN) LIQD Take 1 Box by mouth 2 (two) times daily. 06/13/23   Jerre Simon, MD  QUEtiapine (SEROQUEL) 25 MG tablet Take 1 tablet (25 mg total) by mouth at bedtime. 08/18/23   Jerre Simon, MD      Allergies    Nsaids, Lasix [furosemide], Lotensin [benazepril], and Tenormin [atenolol]    Review of Systems   Review of Systems  Constitutional:  Negative for chills and fever.  Respiratory:  Negative for shortness of breath.   Cardiovascular:  Negative for chest pain.  Gastrointestinal:  Negative for abdominal pain.  Musculoskeletal:        Left-sided hip pain  Neurological:  Negative for headaches.    Physical Exam Updated Vital Signs BP (!) 187/66   Pulse 62   Temp 99 F (37.2 C) (Oral)   Resp 18   SpO2 99%  Physical Exam Vitals and nursing Stevenson reviewed.  Constitutional:  General: She is not in acute distress.    Appearance: She is not ill-appearing.  HENT:     Head: Normocephalic and atraumatic.     Comments: No evidence of trauma head, no raccoon eyes or Battle sign present.    Nose: No congestion.     Mouth/Throat:     Mouth: Mucous membranes are moist.     Pharynx: Oropharynx is clear.     Comments: No trismus no torticollis no oral trauma present. Eyes:     Extraocular Movements: Extraocular movements intact.     Conjunctiva/sclera: Conjunctivae normal.     Pupils: Pupils are equal, round, and reactive to light.  Cardiovascular:     Rate and Rhythm: Normal rate and regular rhythm.     Pulses: Normal pulses.      Heart sounds: No murmur heard.    No friction rub. No gallop.  Pulmonary:     Effort: No respiratory distress.     Breath sounds: No wheezing, rhonchi or rales.     Comments: No obvious trauma of the chest chest is nontender lung sounds are clear bilaterally. Abdominal:     Palpations: Abdomen is soft.     Tenderness: There is abdominal tenderness. There is no right CVA tenderness or left CVA tenderness.     Comments: No obvious trauma of the abdomen but patient does have some point tenderness mainly around her left lower quadrant, no guarding rebound has or peritoneal sign.  Musculoskeletal:     Comments: Spine palpated notable tenderness mainly lower lumbar region around L4 and L5, no crepitus or deformities noted, no pelvis instability no leg shortening, has focalized tenderness mainly on the left trochanter, patient is able to to move her upper shin without difficulty, she has limited range of motion at her hips with flexion due to pain, but is able to move her toes ankles and knees.  Skin:    General: Skin is warm and dry.  Neurological:     Mental Status: She is alert.     Comments: No facial asymmetry no difficulty with word finding following two-step commands there is no unilateral weakness present.  Psychiatric:        Mood and Affect: Mood normal.     ED Results / Procedures / Treatments   Labs (all labs ordered are listed, but only abnormal results are displayed) Labs Reviewed  CBC WITH DIFFERENTIAL/PLATELET - Abnormal; Notable for the following components:      Result Value   RBC 3.12 (*)    Hemoglobin 7.0 (*)    HCT 24.2 (*)    MCV 77.6 (*)    MCH 22.4 (*)    MCHC 28.9 (*)    RDW 18.4 (*)    All other components within normal limits  COMPREHENSIVE METABOLIC PANEL - Abnormal; Notable for the following components:   CO2 21 (*)    Glucose, Bld 103 (*)    BUN 36 (*)    Creatinine, Ser 2.66 (*)    Total Protein 6.2 (*)    GFR, Estimated 17 (*)    All other  components within normal limits  LIPASE, BLOOD - Abnormal; Notable for the following components:   Lipase 67 (*)    All other components within normal limits  URINALYSIS, W/ REFLEX TO CULTURE (INFECTION SUSPECTED) - Abnormal; Notable for the following components:   APPearance HAZY (*)    Hgb urine dipstick SMALL (*)    Protein, ur 100 (*)    Leukocytes,Ua LARGE (*)  Bacteria, UA FEW (*)    All other components within normal limits  CK - Abnormal; Notable for the following components:   Total CK 35 (*)    All other components within normal limits  IRON AND TIBC - Abnormal; Notable for the following components:   Iron 23 (*)    Saturation Ratios 7 (*)    All other components within normal limits  FERRITIN - Abnormal; Notable for the following components:   Ferritin 8 (*)    All other components within normal limits  RETICULOCYTES - Abnormal; Notable for the following components:   RBC. 2.85 (*)    All other components within normal limits  TROPONIN I (HIGH SENSITIVITY) - Abnormal; Notable for the following components:   Troponin I (High Sensitivity) 20 (*)    All other components within normal limits  URINE CULTURE  VITAMIN B12  FOLATE  TYPE AND SCREEN  PREPARE RBC (CROSSMATCH)  TROPONIN I (HIGH SENSITIVITY)    EKG None  Radiology CT L-SPINE NO CHARGE  Result Date: 09/10/2023 CLINICAL DATA:  Fall EXAM: CT LUMBAR SPINE WITHOUT CONTRAST TECHNIQUE: Multidetector CT imaging of the lumbar spine was performed without intravenous contrast administration. Multiplanar CT image reconstructions were also generated. RADIATION DOSE REDUCTION: This exam was performed according to the departmental dose-optimization program which includes automated exposure control, adjustment of the mA and/or kV according to patient size and/or use of iterative reconstruction technique. COMPARISON:  None Available. FINDINGS: Segmentation: 5 lumbar type vertebrae. Alignment: Grade 1 anterolisthesis at L3-4.  Vertebrae: Minimally displaced left L2 transverse process fracture. No other fracture of the lumbar spine. Paraspinal and other soft tissues: Please refer to report for concomitant CT of the abdomen and pelvis. Disc levels: L1-2: Unremarkable. L2-3: Moderate facet hypertrophy without spinal canal stenosis. L3-4: Severe facet hypertrophy with small disc bulge and moderate spinal canal stenosis. L4-5: Small disc bulge and moderate facet arthrosis. No spinal canal stenosis. L5-S1: Disc space narrowing with endplate spurring and mild narrowing of the right neural foramen. IMPRESSION: 1. Minimally displaced left L2 transverse process fracture. 2. 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 Electronically Signed   By: Deatra Robinson M.D.   On: 09/10/2023 02:48   CT Hip Left Wo Contrast  Result Date: 09/10/2023 CLINICAL DATA:  Fall, left flank pain EXAM: CT OF THE LEFT HIP WITHOUT CONTRAST TECHNIQUE: Multidetector CT imaging of the left hip was performed according to the standard protocol. Multiplanar CT image reconstructions were also generated. RADIATION DOSE REDUCTION: This exam was performed according to the departmental dose-optimization program which includes automated exposure control, adjustment of the mA and/or kV according to patient size and/or use of iterative reconstruction technique. COMPARISON:  CT abdomen and pelvis today FINDINGS: Bones/Joint/Cartilage Diffuse osteopenia. No acute bony abnormality. Specifically, no fracture, subluxation, or dislocation. Ligaments Suboptimally assessed by CT. Muscles and Tendons Unremarkable. Soft tissues Unremarkable. IMPRESSION: No acute bony abnormality. Electronically Signed   By: Charlett Nose M.D.   On: 09/10/2023 01:55   CT Head Wo Contrast  Result Date: 09/10/2023 CLINICAL DATA:  Minor head trauma. Patient fell to the floor and another fall this morning hitting a wall. EXAM: CT HEAD WITHOUT CONTRAST CT CERVICAL SPINE WITHOUT CONTRAST TECHNIQUE: Multidetector CT  imaging of the head and cervical spine was performed following the standard protocol without intravenous contrast. Multiplanar CT image reconstructions of the cervical spine were also generated. RADIATION DOSE REDUCTION: This exam was performed according to the departmental dose-optimization program which includes automated exposure control, adjustment of  the mA and/or kV according to patient size and/or use of iterative reconstruction technique. COMPARISON:  CT head 08/26/2020.  MRI brain 06/13/2018. FINDINGS: CT HEAD FINDINGS Brain: No evidence of acute infarction, hemorrhage, hydrocephalus, extra-axial collection or mass lesion/mass effect. Mild diffuse cerebral atrophy. Mild ventricular dilatation consistent with central atrophy. Low-attenuation changes in the deep white matter likely representing small vessel ischemia. Vascular: No hyperdense vessel or unexpected calcification. Skull: Normal. Negative for fracture or focal lesion. Sinuses/Orbits: No acute finding. Postoperative changes in the right globe. Other: None. CT CERVICAL SPINE FINDINGS Alignment: Normal. Skull base and vertebrae: No acute fracture. No primary bone lesion or focal pathologic process. Soft tissues and spinal canal: No prevertebral fluid or swelling. No visible canal hematoma. Disc levels: Degenerative changes with disc space narrowing and endplate osteophyte formation throughout. Degenerative changes in the facet joints. Uncovertebral spurring causes some bone encroachment upon neural foramina bilaterally. Disc osteophyte complexes cause effacement of the anterior thecal sac at multiple levels. Upper chest: Lung apices are clear. Other: None. IMPRESSION: 1. No acute intracranial abnormalities. Chronic atrophy and small vessel ischemic changes. 2. Normal alignment of the cervical spine. No acute displaced fractures identified. Diffuse degenerative changes. Electronically Signed   By: Burman Nieves M.D.   On: 09/10/2023 01:51   CT  Cervical Spine Wo Contrast  Result Date: 09/10/2023 CLINICAL DATA:  Minor head trauma. Patient fell to the floor and another fall this morning hitting a wall. EXAM: CT HEAD WITHOUT CONTRAST CT CERVICAL SPINE WITHOUT CONTRAST TECHNIQUE: Multidetector CT imaging of the head and cervical spine was performed following the standard protocol without intravenous contrast. Multiplanar CT image reconstructions of the cervical spine were also generated. RADIATION DOSE REDUCTION: This exam was performed according to the departmental dose-optimization program which includes automated exposure control, adjustment of the mA and/or kV according to patient size and/or use of iterative reconstruction technique. COMPARISON:  CT head 08/26/2020.  MRI brain 06/13/2018. FINDINGS: CT HEAD FINDINGS Brain: No evidence of acute infarction, hemorrhage, hydrocephalus, extra-axial collection or mass lesion/mass effect. Mild diffuse cerebral atrophy. Mild ventricular dilatation consistent with central atrophy. Low-attenuation changes in the deep white matter likely representing small vessel ischemia. Vascular: No hyperdense vessel or unexpected calcification. Skull: Normal. Negative for fracture or focal lesion. Sinuses/Orbits: No acute finding. Postoperative changes in the right globe. Other: None. CT CERVICAL SPINE FINDINGS Alignment: Normal. Skull base and vertebrae: No acute fracture. No primary bone lesion or focal pathologic process. Soft tissues and spinal canal: No prevertebral fluid or swelling. No visible canal hematoma. Disc levels: Degenerative changes with disc space narrowing and endplate osteophyte formation throughout. Degenerative changes in the facet joints. Uncovertebral spurring causes some bone encroachment upon neural foramina bilaterally. Disc osteophyte complexes cause effacement of the anterior thecal sac at multiple levels. Upper chest: Lung apices are clear. Other: None. IMPRESSION: 1. No acute intracranial  abnormalities. Chronic atrophy and small vessel ischemic changes. 2. Normal alignment of the cervical spine. No acute displaced fractures identified. Diffuse degenerative changes. Electronically Signed   By: Burman Nieves M.D.   On: 09/10/2023 01:51   CT ABDOMEN PELVIS WO CONTRAST  Result Date: 09/10/2023 CLINICAL DATA:  Fall.  Left flank pain. EXAM: CT ABDOMEN AND PELVIS WITHOUT CONTRAST TECHNIQUE: Multidetector CT imaging of the abdomen and pelvis was performed following the standard protocol without IV contrast. RADIATION DOSE REDUCTION: This exam was performed according to the departmental dose-optimization program which includes automated exposure control, adjustment of the mA and/or kV according to patient size  and/or use of iterative reconstruction technique. COMPARISON:  09/20/2007 FINDINGS: Lower chest: Cardiomegaly. Bibasilar opacities, likely atelectasis. No effusions. Hepatobiliary: No suspicious focal hepatic abnormality. Gallbladder unremarkable. No biliary ductal dilatation. Pancreas: No focal abnormality or ductal dilatation. Spleen: No focal abnormality.  Normal size. Adrenals/Urinary Tract: 5.8 cm low-density mass in the upper pole of the left kidney. 4.2 cm low-density mass in the lower pole of the left kidney. These likely reflects cysts. No renal or ureteral stones. No hydronephrosis. No adrenal hemorrhage. Urinary bladder unremarkable. Stomach/Bowel: Left colonic diverticulosis. No active diverticulitis. Stomach and small bowel decompressed. Vascular/Lymphatic: Aortoiliac atherosclerosis. No evidence of aneurysm or adenopathy. Reproductive: Prior hysterectomy.  No adnexal masses. Other: No free fluid or free air. Musculoskeletal: No acute bony abnormality. IMPRESSION: No acute findings in the abdomen or pelvis. Multiple low-density lesions within the left kidney most likely cysts. These could be further characterized with renal ultrasound. No evidence of significant traumatic injury.  Left colonic diverticulosis. Aortic atherosclerosis. Electronically Signed   By: Charlett Nose M.D.   On: 09/10/2023 01:49   DG Hip Unilat W or Wo Pelvis 2-3 Views Left  Result Date: 09/10/2023 CLINICAL DATA:  Fall with left hip injury. EXAM: DG HIP (WITH OR WITHOUT PELVIS) 2-3V LEFT COMPARISON:  None Available. FINDINGS: There is no evidence of hip fracture or dislocation. No pelvic fracture or diastasis is seen. There is osteopenia. No other focal bone abnormality is seen. There is mild symmetric arthrosis of the hips and SI joints. Additional arthrosis in the partially visible lower lumbar spine. There are numerous pelvic phleboliths. Otherwise unremarkable soft tissues, as visualized. IMPRESSION: 1. No acute fracture or dislocation. 2. Osteopenia. 3. 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 Electronically Signed   By: Almira Bar M.D.   On: 09/10/2023 00:36   DG Chest Portable 1 View  Result Date: 09/10/2023 CLINICAL DATA:  Recent fall, initial encounter EXAM: PORTABLE CHEST 1 VIEW COMPARISON:  12/04/2013 FINDINGS: Cardiac shadow is at the upper limits of normal in size. Aortic calcifications are seen. The lungs are well aerated with very mild left lower lobe atelectatic changes. No effusion is seen. No bony abnormality is noted. IMPRESSION: Mild left basilar atelectasis. Electronically Signed   By: Alcide Clever M.D.   On: 09/10/2023 00:35    Procedures .Critical Care  Performed by: Carroll Sage, PA-C Authorized by: Carroll Sage, PA-C   Critical care provider statement:    Critical care time (minutes):  30   Critical care time was exclusive of:  Separately billable procedures and treating other patients   Critical care was necessary to treat or prevent imminent or life-threatening deterioration of the following conditions:  Circulatory failure   Critical care was time spent personally by me on the following activities:  Evaluation of patient's response to treatment,  examination of patient, ordering and review of laboratory studies, ordering and review of radiographic studies, ordering and performing treatments and interventions, pulse oximetry, re-evaluation of patient's condition, review of old charts and development of treatment plan with patient or surrogate   I assumed direction of critical care for this patient from another provider in my specialty: no       Medications Ordered in ED Medications  hydrALAZINE (APRESOLINE) tablet 25 mg (has no administration in time range)  HYDROmorphone (DILAUDID) injection 0.5 mg (0.5 mg Intravenous Given 09/10/23 0051)  ondansetron (ZOFRAN) injection 4 mg (4 mg Intravenous Given 09/10/23 0051)  sodium chloride 0.9 % bolus 1,000 mL (0 mLs  Intravenous Stopped 09/10/23 0338)  0.9 %  sodium chloride infusion (Manually program via Guardrails IV Fluids) ( Intravenous New Bag/Given 09/10/23 1191)    ED Course/ Medical Decision Making/ A&P                                 Medical Decision Making Amount and/or Complexity of Data Reviewed Labs: ordered. Radiology: ordered.  Risk Prescription drug management.   This patient presents to the ED for concern of fall, this involves an extensive number of treatment options, and is a complaint that carries with it a high risk of complications and morbidity.  The differential diagnosis includes intracranial bleed, thoracic/abdominal trauma, orthopedic injury    Additional history obtained:  Additional history obtained from daughter External records from outside source obtained and reviewed including recent ER notes   Co morbidities that complicate the patient evaluation  CKD,  Social Determinants of Health:  Dementia    Lab Tests:  I Ordered, and personally interpreted labs.  The pertinent results include: CBC shows normocytic anemia hemoglobin of 7.0 decreased from baseline, CMP reveals CO2 of 21 glucose 103 BUN 36 creatinine 2.66, CK 35, lipase 67, first Trope  20, second Trope 7,   Imaging Studies ordered:  I ordered imaging studies including x-ray of the chest, hips, CT of the head, C-spine, lumbar spine, CT ab pelvis, CT left hip I independently visualized and interpreted imaging which showed x-rays were unremarkable, CT imaging reveals minimally displaced L2 transverse process fracture I agree with the radiologist interpretation   Cardiac Monitoring:  The patient was maintained on a cardiac monitor.  I personally viewed and interpreted the cardiac monitored which showed an underlying rhythm of: Sinus not signs of ischemia   Medicines ordered and prescription drug management:  I ordered medication including pain medication, antiemetics I have reviewed the patients home medicines and have made adjustments as needed  Critical Interventions:  Symptomatic anemia-will transfuse 1 unit   Reevaluation:  Presents after a fall will obtain further imaging for further assessment.  X-ray of the hip is unremarkable but she is very tender, concern for possible occult fracture will send down for CT imaging of the left hip.  CT imaging reveals transverse fracture of the lumbar spine, patient is able to ambulate, having no complaints, patient is able to ambulate without difficulty.  I recommend blood transfusion as she has a decrease in her hemoglobin and is orthostatic positive, patient was in agreement to this, I recommend Hemoccult but patient deferred.   Consultations Obtained:  N/A    Test Considered:  CT chest-deferred to suspicion for thoracic trauma very low at this time no evidence of trauma exam chest was nontender to palpation.    Rule out low suspicion for intracranial head bleed as patient denies loss of conscious, is not on anticoagulant, she does not endorse headaches, paresthesia/weakness in the upper and lower extremities, no focal deficits present on my exam CT head negative.  Low suspicion for spinal cord abnormality  patient has no red flag symptoms, she is neurovascular intact in lower extremities.  Low suspicion for intra-abdominal trauma CT imaging is negative.  Suspicion for GI bleed is low at this time patient not to nursing any bloody stools or dark tarry stools, she is not on any anticoag's, she has no history of this.  I suspect likely cause of her anemia is from CKD.  Suspicion for UTI very low at  this time not endorsing any urinary symptoms, will defer on antibiotics, culture urine.   Dispostion and problem list  Due to shift change patient may handoff to [facility]   Reassessed patient after blood transfusion, she is feeling well, can be discharged home, close follow-up with nephrology for further management of her anemia, follow-up with neurosurgery as needed for her transverse processes fracture.            Final Clinical Impression(s) / ED Diagnoses Final diagnoses:  Fall, initial encounter  Symptomatic anemia  Closed fracture of transverse process of lumbar vertebra, initial encounter [facility])    Rx / DC Orders ED Discharge Orders     None         Carroll Sage, PA-C 09/10/23 0657    Carroll Sage, PA-C 09/10/23 1610    Marily Memos, MD 09/11/23 [phone]

## 2023-09-10 NOTE — Discharge Instructions (Signed)
You have fractured one of the transverse processes on your back, these typically will heal on their own, recommend over-the-counter pain medications, you may follow-up with neurosurgery for further evaluation. Anemia-please continue with the iron pills are prescribed to you, I recommend follow-up with your nephrologist for further management.  Come back to the emergency department if you develop chest pain, shortness of breath, severe abdominal pain, uncontrolled nausea, vomiting, diarrhea.

## 2023-09-11 LAB — TYPE AND SCREEN
ABO/RH(D): A POS
Antibody Screen: NEGATIVE
Unit division: 0

## 2023-09-11 LAB — BPAM RBC
Blood Product Expiration Date: 202410292359
ISSUE DATE / TIME: 202410020607
Unit Type and Rh: 6200

## 2023-09-12 LAB — URINE CULTURE: Culture: >=100000 — AB

## 2023-09-13 NOTE — Progress Notes (Signed)
ED Antimicrobial Stewardship Positive Culture Follow Up   SHANAN FITZPATRICK is an 82 y.o. female who presented to [facility] on 09/09/2023 with a chief complaint of  Chief Complaint  Patient presents with   Fall    Recent Results (from the past 720 hour(s))  Urine Culture     Status: Abnormal   Collection Time: 09/10/23  3:14 AM   Specimen: Urine, Random  Result Value Ref Range Status   Specimen Description URINE, RANDOM  Final   Special Requests   Final    NONE Reflexed from [id number] Performed at [facility], [address], [place], [place] [address]    Culture >=100,000 COLONIES/mL ESCHERICHIA COLI (A)  Final   Report Status 09/12/2023 FINAL  Final   Organism ID, Bacteria ESCHERICHIA COLI (A)  Final      Susceptibility   Escherichia coli - MIC*    AMPICILLIN >=32 RESISTANT Resistant     CEFAZOLIN 32 INTERMEDIATE Intermediate     CEFEPIME <=0.12 SENSITIVE Sensitive     CEFTRIAXONE 0.5 SENSITIVE Sensitive     CIPROFLOXACIN <=0.25 SENSITIVE Sensitive     GENTAMICIN <=1 SENSITIVE Sensitive     IMIPENEM <=0.25 SENSITIVE Sensitive     NITROFURANTOIN <=16 SENSITIVE Sensitive     TRIMETH/SULFA <=20 SENSITIVE Sensitive     AMPICILLIN/SULBACTAM 16 INTERMEDIATE Intermediate     PIP/TAZO <=4 SENSITIVE Sensitive     * >=100,000 COLONIES/mL ESCHERICHIA COLI    [x]  Patient discharged originally without antimicrobial agent and treatment may be indicated   Symptom check -  If no symptoms, then no antibiotics necessary.   If symptoms, then start below antibiotic.   New antibiotic prescription: Start macrobid 100mg  PO BID x 5 days.   ED Provider: Derwood Kaplan, MD    Estill Batten, PharmD, BCCCP  09/13/2023, 11:27 AM Clinical Pharmacist Monday - Friday phone -  [phone] Saturday - Sunday phone - [phone]

## 2023-09-15 ENCOUNTER — Telehealth (HOSPITAL_BASED_OUTPATIENT_CLINIC_OR_DEPARTMENT_OTHER): Payer: Self-pay | Admitting: *Deleted

## 2023-09-15 ENCOUNTER — Other Ambulatory Visit: Payer: Self-pay | Admitting: Student

## 2023-09-15 NOTE — Telephone Encounter (Signed)
Post ED Visit - Positive Culture Follow-up: Successful Patient Follow-Up  Culture assessed and recommendations reviewed by:  []  Enzo Bi, Pharm.D. []  Celedonio Miyamoto, Pharm.D., BCPS AQ-ID []  Garvin Fila, Pharm.D., BCPS []  Georgina Pillion, [address].D., BCPS []  [place], [address].D., BCPS, AAHIVP []  Estella Husk, Pharm.D., BCPS, AAHIVP []  Lysle Pearl, PharmD, BCPS []  Phillips Climes, PharmD, BCPS []  Agapito Games, PharmD, BCPS [x]  Estill Batten,  PharmD  Positive urine culture  [x]  Patient discharged without antimicrobial prescription and treatment is now indicated []  Organism is resistant to prescribed ED discharge antimicrobial []  Patient with positive blood cultures  Changes discussed with ED provider: Derwood Kaplan, MD New antibiotic prescription Macrobid 100mg  PO BID x 5 days Called to [employer] patients grandaughter, date 09/15/23, time 0919 Pt has foul smelling urine and some flank pain   Patsey Berthold 09/15/2023, 9:19 AM

## 2023-09-17 ENCOUNTER — Encounter (HOSPITAL_COMMUNITY): Payer: Medicare Other

## 2023-10-03 ENCOUNTER — Encounter: Payer: Self-pay | Admitting: Student

## 2023-10-06 DIAGNOSIS — N189 Chronic kidney disease, unspecified: Secondary | ICD-10-CM | POA: Diagnosis not present

## 2023-10-06 DIAGNOSIS — N184 Chronic kidney disease, stage 4 (severe): Secondary | ICD-10-CM | POA: Diagnosis not present

## 2023-10-06 DIAGNOSIS — I129 Hypertensive chronic kidney disease with stage 1 through stage 4 chronic kidney disease, or unspecified chronic kidney disease: Secondary | ICD-10-CM | POA: Diagnosis not present

## 2023-10-06 DIAGNOSIS — D631 Anemia in chronic kidney disease: Secondary | ICD-10-CM | POA: Diagnosis not present

## 2023-10-14 ENCOUNTER — Encounter: Payer: Self-pay | Admitting: Student

## 2023-10-14 ENCOUNTER — Ambulatory Visit (INDEPENDENT_AMBULATORY_CARE_PROVIDER_SITE_OTHER): Payer: Medicare Other | Admitting: Student

## 2023-10-14 ENCOUNTER — Telehealth: Payer: Self-pay | Admitting: *Deleted

## 2023-10-14 VITALS — BP 172/75 | HR 77 | Ht 67.0 in | Wt 148.6 lb

## 2023-10-14 DIAGNOSIS — F0390 Unspecified dementia without behavioral disturbance: Secondary | ICD-10-CM

## 2023-10-14 NOTE — Progress Notes (Addendum)
    SUBJECTIVE:   CHIEF COMPLAINT / HPI:   Megan Stevenson with known history of dementia presenting today with sister Megan Stevenson for follow-up on request from grand daughter for placement at a memory facility. No recent fall per sister.  Patient is still able to dress herself, use the bathroom and clean up however she is not able to manage her finances or medication, cooking and cleaning of her place.  MyChart message from granddaughter Megan Stevenson:  Hey, I left a message with the front desk a couple of weeks ago. I was trying to find out how to put my grandmother in a nursing home. At this time has personal care services at night. We tried to increase her hours for day time service also. It was denied saying she at her max hours. At this time she is refusing treatments and has been to the hospital due to her falling. I am trying to put her some where so she can have 24/7 care.   PERTINENT  PMH / PSH: Reviewed   OBJECTIVE:   BP (!) 172/75   Pulse 77   Ht 5\' 7"  (1.702 m)   Wt 148 lb 9.6 oz (67.4 kg)   SpO2 100%   BMI 23.27 kg/m    Physical Exam General: Alert, well appearing, NAD Cardiovascular: Regular rate and well-perfused Respiratory: Normal work of breathing on RA Psych: Pleasant, Alert and oriented to person and place but not time or president.  ASSESSMENT/PLAN:   Dementia [facility]) Patient with known dementia and most recent MoCA 08/28/2021 was 5/30. Although she is able to complete some ADLs such as cleaning, using the bathroom and dressing however she is unable to manage her finances, medication or make an complex  planning and also given report of falls in the past per daughter she will most likely benefit from placement at a memory unity.  Placed referral to social work to initiate placement process. -Placed referral to ref [id number].  Hypertension BP elevated today. Reported non compliance with medication. Pt called to schedule follow up appointment for BP and scheduled with  RN.  Health maintenance Patient is due for her influenza and COVID-vaccine.  Discussed with patient and they are with the flare until her granddaughter is able to make this decision.  Jerre Simon, MD [facility]

## 2023-10-14 NOTE — Assessment & Plan Note (Signed)
With known strep dementia and most recent MoCA 08/28/2021 was 5/30. Able to complete sick ADLs such as cleaning, using the bathroom and dressing however given she is unable to manage her finances, medication or planning and also report of falls in the past per daughter she will most likely benefit from placement at a memory unity.  Placed referral to social work to initiate placement process. -Placed referral to ref [id number].

## 2023-10-14 NOTE — Progress Notes (Signed)
  Care Coordination   Note   10/14/2023 Name: Megan Stevenson MRN: [id number] DOB: [birth year]  Megan Stevenson is a 82 y.o. year old female who sees Jerre Simon, MD for primary care. I reached out to Lavon Paganini by phone today to offer care coordination services.  [name] was given information about Care Coordination services today including:   The Care Coordination services include support from the care team which includes your Nurse Coordinator, Clinical Social Worker, or Pharmacist.  The Care Coordination team is here to help remove barriers to the health concerns and goals most important to you. Care Coordination services are voluntary, and the patient may decline or stop services at any time by request to their care team member.   Care Coordination Consent Status: Patient granddaughter Megan Stevenson  agreed to services and verbal consent obtained.   Follow up plan:  Telephone appointment with care coordination team member scheduled for:  11/7 with LCSW and 10/17/23 with RNCM    Encounter Outcome:  Patient Scheduled   [facility]  Care Coordination Care Guide  Direct Dial: [phone]

## 2023-10-14 NOTE — Patient Instructions (Addendum)
It was wonderful to see you today. Thank you for allowing me to be a part of your care. Below is a short summary of what we discussed at your visit today:  I have sent in referral to a caseworker/social worker.  They will reach out to you or [name] to make plans for placement   If you have any questions or concerns, please do not hesitate to contact us via phone or MyChart message.   Jerre Simon, MD [facility]

## 2023-10-16 ENCOUNTER — Ambulatory Visit: Payer: Self-pay | Admitting: Licensed Clinical Social Worker

## 2023-10-16 NOTE — Patient Outreach (Signed)
  Care Coordination   Initial Visit Note   10/16/2023 Name: Megan Stevenson MRN: [id number] DOB: 10/09/1941  Megan Stevenson is Megan 82 y.o. year old female who sees Megan Simon, MD for primary care. I spoke with  Megan Stevenson by phone today.  What matters to the patients health and wellness today?  Megan Stevenson requested assistance with pca services .    Goals Addressed             This Visit's Progress    care coordination       Activities and task to complete in order to accomplish goals.   Delegate task to family and friends to alleviate burnout  I have placed Megan referral to [employer] [facility] CAP [id number] to establish care.  Look for Megan packet in the mail from [place].         SDOH assessments and interventions completed:  Yes  SDOH Interventions Today    Flowsheet Row Most Recent Value  SDOH Interventions   Food Insecurity Interventions Intervention Not Indicated  Housing Interventions Intervention Not Indicated  Transportation Interventions Intervention Not Indicated  Utilities Interventions Intervention Not Indicated        Care Coordination Interventions:  Yes, provided  Interventions Today    Flowsheet Row Most Recent Value  Chronic Disease   Chronic disease during today's visit Other  [dementia]  General Interventions   General Interventions Discussed/Reviewed [facility], Level of Care  Level of Care Personal Care Services, Skilled Nursing Facility  [place] form completed for CAPS and priority request form. Granddaughter Megan Stevenson and LCSW Megan Stevenson discuss alternative placement into Megan memory care facility. Granddaughter will continue to explore options however PCA suits family right now.]  Education Interventions   Education Provided Provided Education  Provided Verbal Education On [employer] granddaughter how to assess Megan good nursing facility]       Follow up plan: Follow up call scheduled for 10/20/2023 at 1pm     Encounter Outcome:  Patient Visit Completed    Megan Stevenson MSW, LCSW Licensed Clinical Social Worker  [facility], Population Health Direct Dial: [phone]  Fax: [phone]

## 2023-10-17 ENCOUNTER — Other Ambulatory Visit: Payer: Self-pay | Admitting: *Deleted

## 2023-10-17 ENCOUNTER — Telehealth: Payer: Self-pay

## 2023-10-17 NOTE — Telephone Encounter (Signed)
Received call from [facility].   She wanted to make PCP aware that patient was referred to palliative care program and that she will begin to receive these services.   FYI to PCP.   Veronda Prude, RN

## 2023-10-17 NOTE — Patient Outreach (Signed)
Care Management   Visit Note  10/17/2023 Name: Megan Stevenson MRN: [id number] DOB: July 15, 1941  Subjective: Megan Stevenson is a 82 y.o. year old female who is a primary care patient of Jerre Simon, MD. The Care Management team was consulted for assistance.      Engaged with patient spoke with the family member (POA, [place], [place]).    Goals Addressed             This Visit's Progress    RNCM Care Management Expected Outcome: Monitor, Self-Manage and Reduce Symptoms of: HTN, CKD, Dementia, Falls       Current Barriers:  Knowledge Deficits related to plan of care for management of HTN, CKD, Anemia, Dementia, and Falls  Care Coordination needs related to Limited access to caregiver, Cognitive Deficits, Memory Deficits, and Inability to perform IADL's independently Chronic Disease Management support and education needs related to HTN, CKD, Anemia, Dementia, and Falls  Lacks caregiver support Naval [employer] Deficits  RNCM Clinical Goal(s):  Patient will verbalize understanding of plan for management of HTN, CKD Stage , Anemia, Dementia, and Falls as evidenced by verbal explanation, recognizing symptoms, daily monitoring and compliance  take all medications exactly as prescribed and will call provider for medication related questions as evidenced by compliance with medications and monitoring of any new changes attend all scheduled medical appointments with primary care provider as evidenced by adhering to all appointments to ensure all needs are being met demonstrate Improved and Ongoing adherence to prescribed treatment plan for HTN, CKD, Anemia, Dementia, and Falls as evidenced by consistent medication compliance, symptom monitoring, obtaining 24 hr personal care assistance or SNF placement. RNCM referred patient granddaughter to [employer] for SNF/ALF/Memory Care options & advised to tour facilities that may be of interest/capable of meeting patients needs. continue  to work with RN Care Manager to address care management and care coordination needs related to  HTN, CKD, Anemia, Dementia, and Falls as evidenced by adherence to CM Team Scheduled appointments. work with Child psychotherapist to address  related to the management of Limited access to caregiver, Cognitive Deficits, Memory Deficits, and Inability to perform IADL's independently related to the management of Dementia as evidenced by review of EMR and patient or social worker report. RNCM referred granddaughter to [employer] for placement options in her local area. experience decrease in ED visits as evidenced by EMR review.  ED visits in in last 6 months = 3 through collaboration with RN Care manager, provider, and care team.   Interventions: Evaluation of current treatment plan related to  self management and patient's adherence to plan as established by provider Anemia/Bleeding Interventions:  (Status:  New goal. and Goal on track:  Yes.) Long Term Goal  Assessment of understanding of anemia/bleeding disorder diagnosis  Basic overview and discussion of anemia/bleeding disorder or acute disease state  Medications reviewed  Counseled on avoidance of NSAIDs due to increased bleeding risk Counseled on importance of regular laboratory monitoring as directed by provider Provided education about signs and symptoms of active bleeding such as stomach discomfort, coughing up blood or blood tinged secretions, bleeding from the gums/teeth, nosebleeds, increased bruising, blood in the urine/stool and/or if a traumatic injury occurs, regardless of severity of injury  Advised to call provider or 911 if active bleeding or signs and symptoms of active bleeding occur encouraged strategies to prevent falls related to fatigue, weakness and dizziness; encouraged sitting before standing and using an assistive device encouraged optimal oral intake to support fluid balance and  nutrition Lab Results  Component Value Date   WBC  7.2 09/10/2023   HGB 7.0 (L) 09/10/2023   HCT 24.2 (L) 09/10/2023   MCV 77.6 (L) 09/10/2023   PLT 294 09/10/2023     Chronic Kidney Disease Interventions:  (Status:  New goal. and Goal on track:  Yes.) Long Term Goal Evaluation of current treatment plan related to chronic kidney disease self management and patient's adherence to plan as established by provider      Provided education to patient re: stroke prevention, s/s of heart attack and stroke    Reviewed prescribed diet: Heart Healthy. Patient granddaughter states that patient has somewhat of a poor po intake and will typically only eat when she is hungry. She states that she has tried protein waters and ensure supplements but states that they are costly and due to limited funds with food stamps she can only provide her with a limited number. RNCM will follow up with [employer] about PAP for patient to ensure patient is meeting her nutritional demands. Reviewed medications with patient and discussed importance of compliance. Medications are prefilled by granddaughter to ensure patient is compliant.    Advised patient, providing education and rationale, to monitor blood pressure daily and record, calling PCP for findings outside established parameters    Discussed complications of poorly controlled blood pressure such as heart disease, stroke, circulatory complications, vision complications, kidney impairment, sexual dysfunction    Reviewed scheduled/upcoming provider appointments including    Discussed plans with patient for ongoing care management follow up and provided patient with direct contact information for care management team    Last practice recorded BP readings:  BP Readings from Last 3 Encounters:  10/14/23 (!) 172/75  09/10/23 (!) 171/75  09/03/23 (!) 165/59   Most recent eGFR/CrCl:  Lab Results  Component Value Date   EGFR 16 (L) 08/01/2023    No components found for: "CRCL"   Falls Interventions:  (Status:  New goal. and  Goal on track:  Yes.) Long Term Goal Provided written and verbal education re: potential causes of falls and Fall prevention strategies Reviewed medications and discussed potential side effects of medications such as dizziness and frequent urination Advised patient of importance of notifying provider of falls Assessed for signs and symptoms of orthostatic hypotension. [name] states patient does not always take her time changing positions slowly. Patient is reminded frequently to change positions slowly to prevent dizziness and falls. Assessed for falls since last encounter. 2 falls recently noted with the most recent being with injury. [name] stated that she fracture her lower rib but no intervention was needed. Assessed patients knowledge of fall risk prevention secondary to previously provided education   Hypertension Interventions:  (Status:  New goal. and Goal on track:  Yes.) Long Term Goal Last practice recorded BP readings:  BP Readings from Last 3 Encounters:  10/14/23 (!) 172/75  09/10/23 (!) 171/75  09/03/23 (!) 165/59   Most recent eGFR/CrCl:  Lab Results  Component Value Date   EGFR 16 (L) 08/01/2023    No components found for: "CRCL"  Evaluation of current treatment plan related to hypertension self management and patient's adherence to plan as established by provider Provided education to patient re: stroke prevention, s/s of heart attack and stroke Reviewed medications with patient and discussed importance of compliance Advised patient, providing education and rationale, to monitor blood pressure daily and record, calling PCP for findings outside established parameters. Granddaughter keeps BP log at patients home; unable to provide  recent BP.  Dementia:  (Status:  New goal.)  Long Term Goal Maintain cognitive function as much as possible Ensure a balanced diet tailored to the patient's preferences and nutritional needs Monitor fluid intake to prevent  dehydration Emotional Support Provided to patient/caregiver, Discussed importance of discussing diagnosis with provider, Discussed importance of attendance to all provider appointments, and Advised to contact provider for new or worsening symptoms  Patient Goals/Self-Care Activities: Take all medications as prescribed Attend all scheduled provider appointments Call pharmacy for medication refills 3-7 days in advance of running out of medications Call provider office for new concerns or questions  Work with the social worker to address care coordination needs and will continue to work with the clinical team to address health care and disease management related needs call the Suicide and Crisis Lifeline: 988 call the [place] National Suicide Prevention Lifeline: 337-498-7161 or TTY: 709-132-3392 TTY (662) 581-5398) to talk to a trained counselor call 1-800-273-TALK (toll free, 24 hour hotline) go to [facility] [address], [place] [phone]) call 911 if experiencing a Mental Health or Behavioral Health Crisis  check blood pressure daily write blood pressure results in a log or diary keep all doctor appointments report new symptoms to your doctor  Follow Up Plan:  Telephone follow up appointment with care management team member scheduled for:  11-17-2023 at 1:00 pm            Consent to Services:  Patient was given information about care management services, agreed to services, and gave verbal consent to participate.   Plan: Telephone follow up appointment with care management team member scheduled for: 11-17-2023 at 1:00 pm  Danise Edge, BSN RN RN Care Manager  [facility]  Ambulatory Care Management  Direct Number: [phone]

## 2023-10-17 NOTE — Patient Instructions (Signed)
Care Management   Initial Visit Note  10/17/2023 Name: Megan Stevenson MRN: [id number] DOB: 1941/05/25  Megan Stevenson is enrolled in a Managed Medicaid plan: No. Outreach attempt today was successful.   Subjective:   Objective:  Assessment: Megan Stevenson is a 82 y.o. year old female who sees Jerre Simon, MD for primary care. The care management team was consulted for assistance with care management and care coordination needs related to Disease Management, Educational Needs, Care Coordination, Caregiver Stress support, and Dementia and Caregiver Support.   Review of patient status, including review of consultants reports, relevant laboratory and other test results, and collaboration with appropriate care team members and the patient's provider was performed as part of comprehensive patient evaluation and provision of care management services.    SDOH (Social Determinants of Health) screening performed today. See Care Plan Entry related to challenges with: Social Connections   Goals Addressed             This Visit's Progress    RNCM Care Management Expected Outcome: Monitor, Self-Manage and Reduce Symptoms of: HTN, CKD, Dementia, Falls       Current Barriers:  Knowledge Deficits related to plan of care for management of HTN, CKD, Anemia, Dementia, and Falls  Care Coordination needs related to Limited access to caregiver, Cognitive Deficits, Memory Deficits, and Inability to perform IADL's independently Chronic Disease Management support and education needs related to HTN, CKD, Anemia, Dementia, and Falls  Lacks caregiver support Naval architect Deficits  RNCM Clinical Goal(s):  Patient will verbalize understanding of plan for management of HTN, CKD Stage , Anemia, Dementia, and Falls as evidenced by verbal explanation, recognizing symptoms, daily monitoring and compliance  take all medications exactly as prescribed and will call provider for medication related  questions as evidenced by compliance with medications and monitoring of any new changes attend all scheduled medical appointments with primary care provider as evidenced by adhering to all appointments to ensure all needs are being met demonstrate Improved and Ongoing adherence to prescribed treatment plan for HTN, CKD, Anemia, Dementia, and Falls as evidenced by consistent medication compliance, symptom monitoring, obtaining 24 hr personal care assistance or SNF placement. RNCM referred patient granddaughter to [employer] for SNF/ALF/Memory Care options & advised to tour facilities that may be of interest/capable of meeting patients needs. continue to work with RN Care Manager to address care management and care coordination needs related to  HTN, CKD, Anemia, Dementia, and Falls as evidenced by adherence to CM Team Scheduled appointments. work with Child psychotherapist to address  related to the management of Limited access to caregiver, Cognitive Deficits, Memory Deficits, and Inability to perform IADL's independently related to the management of Dementia as evidenced by review of EMR and patient or social worker report. RNCM referred granddaughter to [employer] for placement options in her local area. experience decrease in ED visits as evidenced by EMR review.  ED visits in in last 6 months = 3 through collaboration with RN Care manager, provider, and care team.   Interventions: Evaluation of current treatment plan related to  self management and patient's adherence to plan as established by provider Anemia/Bleeding Interventions:  (Status:  New goal. and Goal on track:  Yes.) Long Term Goal  Assessment of understanding of anemia/bleeding disorder diagnosis  Basic overview and discussion of anemia/bleeding disorder or acute disease state  Medications reviewed  Counseled on avoidance of NSAIDs due to increased bleeding risk Counseled on importance of regular laboratory monitoring as directed  by  provider Provided education about signs and symptoms of active bleeding such as stomach discomfort, coughing up blood or blood tinged secretions, bleeding from the gums/teeth, nosebleeds, increased bruising, blood in the urine/stool and/or if a traumatic injury occurs, regardless of severity of injury  Advised to call provider or 911 if active bleeding or signs and symptoms of active bleeding occur encouraged strategies to prevent falls related to fatigue, weakness and dizziness; encouraged sitting before standing and using an assistive device encouraged optimal oral intake to support fluid balance and nutrition Lab Results  Component Value Date   WBC 7.2 09/10/2023   HGB 7.0 (L) 09/10/2023   HCT 24.2 (L) 09/10/2023   MCV 77.6 (L) 09/10/2023   PLT 294 09/10/2023     Chronic Kidney Disease Interventions:  (Status:  New goal. and Goal on track:  Yes.) Long Term Goal Evaluation of current treatment plan related to chronic kidney disease self management and patient's adherence to plan as established by provider      Provided education to patient re: stroke prevention, s/s of heart attack and stroke    Reviewed prescribed diet: Heart Healthy. Patient granddaughter states that patient has somewhat of a poor po intake and will typically only eat when she is hungry. She states that she has tried protein waters and ensure supplements but states that they are costly and due to limited funds with food stamps she can only provide her with a limited number. RNCM will follow up with [employer] about PAP for patient to ensure patient is meeting her nutritional demands. Reviewed medications with patient and discussed importance of compliance. Medications are prefilled by granddaughter to ensure patient is compliant.    Advised patient, providing education and rationale, to monitor blood pressure daily and record, calling PCP for findings outside established parameters    Discussed complications of poorly controlled  blood pressure such as heart disease, stroke, circulatory complications, vision complications, kidney impairment, sexual dysfunction    Reviewed scheduled/upcoming provider appointments including    Discussed plans with patient for ongoing care management follow up and provided patient with direct contact information for care management team    Last practice recorded BP readings:  BP Readings from Last 3 Encounters:  10/14/23 (!) 172/75  09/10/23 (!) 171/75  09/03/23 (!) 165/59   Most recent eGFR/CrCl:  Lab Results  Component Value Date   EGFR 16 (L) 08/01/2023    No components found for: "CRCL"   Falls Interventions:  (Status:  New goal. and Goal on track:  Yes.) Long Term Goal Provided written and verbal education re: potential causes of falls and Fall prevention strategies Reviewed medications and discussed potential side effects of medications such as dizziness and frequent urination Advised patient of importance of notifying provider of falls Assessed for signs and symptoms of orthostatic hypotension. Megan Stevenson states patient does not always take her time changing positions slowly. Patient is reminded frequently to change positions slowly to prevent dizziness and falls. Assessed for falls since last encounter. 2 falls recently noted with the most recent being with injury. Megan Stevenson stated that she fracture her lower rib but no intervention was needed. Assessed patients knowledge of fall risk prevention secondary to previously provided education   Hypertension Interventions:  (Status:  New goal. and Goal on track:  Yes.) Long Term Goal Last practice recorded BP readings:  BP Readings from Last 3 Encounters:  10/14/23 (!) 172/75  09/10/23 (!) 171/75  09/03/23 (!) 165/59   Most recent eGFR/CrCl:  Lab  Results  Component Value Date   EGFR 16 (L) 08/01/2023    No components found for: "CRCL"  Evaluation of current treatment plan related to hypertension self management and patient's  adherence to plan as established by provider Provided education to patient re: stroke prevention, s/s of heart attack and stroke Reviewed medications with patient and discussed importance of compliance Advised patient, providing education and rationale, to monitor blood pressure daily and record, calling PCP for findings outside established parameters. Granddaughter keeps BP log at patients home; unable to provide recent BP.  Dementia:  (Status:  New goal.)  Long Term Goal Maintain cognitive function as much as possible Ensure a balanced diet tailored to the patient's preferences and nutritional needs Monitor fluid intake to prevent dehydration Emotional Support Provided to patient/caregiver, Discussed importance of discussing diagnosis with provider, Discussed importance of attendance to all provider appointments, and Advised to contact provider for new or worsening symptoms  Patient Goals/Self-Care Activities: Take all medications as prescribed Attend all scheduled provider appointments Call pharmacy for medication refills 3-7 days in advance of running out of medications Call provider office for new concerns or questions  Work with the social worker to address care coordination needs and will continue to work with the clinical team to address health care and disease management related needs call the Suicide and Crisis Lifeline: 988 call the [place] National Suicide Prevention Lifeline: 303-738-5512 or TTY: 903-073-3636 TTY (419)449-0160) to talk to a trained counselor call 1-800-273-TALK (toll free, 24 hour hotline) go to [facility] [address], [place] [phone]) call 911 if experiencing a Mental Health or Behavioral Health Crisis  check blood pressure daily write blood pressure results in a log or diary keep all doctor appointments report new symptoms to your doctor  Follow Up Plan:  Telephone follow up appointment with care management  team member scheduled for:  11-17-2023 at 1:00 pm           Follow up plan:  Telephone follow up appointment with care management team member scheduled for: 11-17-2023 at 1:00 pm  Megan Stevenson was given information about Care Management services today including:  Care Management services include personalized support from designated clinical staff supervised by a physician, including individualized plan of care and coordination with other care providers 24/7 contact phone numbers for assistance for urgent and routine care needs. The patient may stop CCM services at any time (effective at the end of the month) by phone call to the office staff.  Patient agreed to services and verbal consent obtained.  Danise Edge, BSN RN RN Care Manager  [facility]  [facility]  Direct Number: [phone]   Dementia Caregiver Guide Dementia is a condition that affects the way the brain works. It often affects thinking and memory. A person with dementia may: Forget things. Have trouble talking or responding to your questions. Have trouble paying attention. Have trouble thinking clearly and making good decisions. Get lost or wander away from home or other places. Have big changes in their mood or emotions. They may: Feel very worried, nervous, or depressed. Have angry outbursts. Be suspicious or accuse you of things. Have childlike behavior and language. Taking care of someone with dementia can be a challenge. The tips below can help you care for the person. How to help manage lifestyle changes Dementia usually gets worse slowly over time. In the early stages, people with dementia can stay safe and take care of themselves with some help. In  later stages, they need help with daily tasks like getting dressed, grooming, and going to the bathroom. Communicating When the person is talking and seems frustrated, make eye contact and hold the person's hand. Ask questions that can be answered  with a yes or no. Use simple words and a calm voice. Only give one direction at a time. Limit choices for the person. Too many choices can be stressful. Avoid correcting the person in a negative way. If the person can't find the right words, gently try to help. Preventing injury  Keep floors clear. Remove rugs, magazine racks, and floor lamps. Keep hallways well lit, especially at night. Put a handrail and nonslip mat in the bathtub or shower. Put childproof locks on cabinets that have dangerous items in them. These items include medicine, alcohol, guns, cleaning products, and sharp tools. For doors to the outside, put locks where the person can't see or reach them. This helps keep the person from going out of the house and getting lost. Be ready for emergencies. Keep a list of emergency phone numbers and addresses close by. Remove car keys and lock garage doors so the person doesn't try to drive. Have the person wear a bracelet that tracks where they are and shows that they're a person with memory loss. This should be worn at all times for safety. Helping with daily life  Keep the person on track with their daily routine. Try to identify areas where the person may need help. Be supportive, patient, calm, and encouraging. Gently remind the person that adjusting to changes takes time. Help with the tasks that the person has asked for help with. Keep the person involved in daily tasks and decisions as much as you can. Encourage conversation, but try not to get frustrated if the person struggles to find words or doesn't seem to appreciate your help. Other tips Think about any safety risks and take steps to avoid them. Keep things organized: Organize medicines in a pill box for each day of the week. Keep a calendar in a central place. Use it to remind the person of health care visits or other activities. Create a plan to handle any legal or financial matters. Get help from a professional if  needed. Help make sure the person: Takes medicines only as told by their health care providers. Eats regular, healthy meals. They should also drink plenty of fluids. Goes to all scheduled health care appointments. Gets regular sleep. Taking care of yourself Being a caregiver for someone with dementia can be hard. You may feel stressed and have many other emotions. It's important to also take care of yourself. Here are some tips: Find out about services that can provide short-term care for the person. This is called respite care. It can allow you to take a break when you need one. Find healthy ways to deal with stress. Some ways include: Spending time with other people. Exercising. Meditating or doing deep breathing exercises. Take care of your own health by: Getting enough sleep. Eating healthy foods. Getting regular exercise. Join a support group with others who are caregivers. These groups can help you: Learn other ways to deal with stress. Share experiences with others. Get emotional comfort and support. Learn about caregiving as the disease gets worse. Find resources in your community. Where to find support: Many people and organizations offer support. These include: Support groups for people with dementia. Support groups for caregivers. Counselors or therapists. Home health care services. Adult day care centers. Where  to find more information Alzheimer's Association: Western[employer] Family Caregiver Alliance: caregiver.org Alzheimer's Foundation of [place]: alzfdn.org Contact a health care provider if: The person's health is quickly getting worse. You're no longer able to care for the person. Caring for the person is affecting your physical and emotional health. You're feeling worried, nervous, or depressed about caring for the person. Get help right away if: You feel like the person may hurt themselves or others. The person has talked about taking their own life. These  symptoms may be an emergency. Take one of these steps right away: Go to your nearest emergency room. Call 911. Call the National Suicide Prevention Lifeline at 6408841514 or 988. Text the Crisis Text Line at 916-466-8200. This information is not intended to replace advice given to you by your health care provider. Make sure you discuss any questions you have with your health care provider. Document Revised: 03/07/2023 Document Reviewed: 03/07/2023 Elsevier Patient Education  2024 Elsevier Inc.  Preventing Falls and Fractures  Falls can be very serious, especially for older adults or people with osteoporosis  Falls can be caused by: Tripping or slipping Slow reflexes Balance problems Reduced muscle strength Poor vision or a recent change in prescription Illness and some medications (especially blood pressure pills, diuretics, heart medicines, muscle relaxants and sleep medications) Drinking alcohol  To prevent falls outdoors: Use a can or walker if needed Wear rubber-soled shoes so you don't slip DO NOT buy "shape up" shoes with rocker bottom soles if you have balance problems.  The thick soles and shape make it more difficult to keep your balance. Put kitty litter or salt on icy sidewalks Walk on the grass if the sidewalks are slick Avoid walking on uneven ground whenever possible  T prevent falls indoors: Keep rooms clutter-free, especially hallways, stairs and paths to light switches Remove throw rugs Install night lights, especially to and in the bathroom Turn on lights before going downstairs Keep a flashlight next to your bed Buy a cordless phone to keep with you instead of jumping up to answer the phone Install grab bars in the bathroom near the shower and toilet Install rails on both sides of the stairs.  Make sure the stairs are well lit Wear slippers with non-skid soles.  Do not walk around in stockings or socks  Balance problems and dizziness are not a normal part of  growing older.  If you begin having balance problems or dizziness see your doctor.  Physical Therapy can help you with many balance problems, strengthening hip and leg muscles and with gait training.  To keep your bones healthy make sure you are getting enough calcium and Vitamin D each day.  Ask your doctor or pharmacist about supplements.  Regular weight-bearing exercise like walking, lifting weights or dancing can help strengthen bones and prevent osteoporosis.  Anemia  Anemia is a condition in which there are not enough red blood cells or hemoglobin in the blood. Hemoglobin is a substance in red blood cells that carries oxygen. When you do not have enough red blood cells or hemoglobin (are anemic), your body cannot get enough oxygen, and your organs may not work properly. As a result, you may feel very tired or have other problems. What are the causes? Common causes of anemia include: Excessive bleeding. Anemia can be caused by excessive bleeding inside or outside the body, including bleeding from the intestines or from heavy menstrual periods in females. Poor nutrition. Long-lasting (chronic) kidney, thyroid, and liver disease. Bone marrow  disorders, spleen problems, and blood disorders. Cancer and treatments for cancer. Human immunodeficiency virus (HIV) and acquired immunodeficiency syndrome (AIDS). Infections, medicines, and autoimmune disorders that destroy red blood cells. What are the signs or symptoms? Symptoms of this condition include: Minor weakness. Dizziness. Headache, or difficulties concentrating and sleeping. Heartbeats that feel irregular or faster than normal (palpitations). Shortness of breath, especially with exercise. Pale skin, lips, and nails, or cold hands and feet. Upset stomach (indigestion) and nausea. Symptoms may occur suddenly or develop slowly. If your anemia is mild, you may not have symptoms. How is this diagnosed? This condition is diagnosed based on  blood tests, your medical history, and a physical exam. In some cases, a test may be needed in which cells are removed from the soft tissue inside of a bone and looked at under a microscope (bone marrow biopsy). Your health care provider may also check your stool (feces) for blood and may do more testing to look for the cause of your bleeding. Other tests may include: Imaging tests, such as a CT scan or MRI. A procedure to see inside your esophagus and stomach (endoscopy). The esophagus is the part of the body that moves food from your mouth to your stomach. A procedure to see inside your colon and rectum (colonoscopy). How is this treated? Treatment for this condition depends on the cause. If you continue to lose a lot of blood, you may need to be treated at a hospital. Treatment may include: Taking supplements of iron, vitamin B12, or folic acid. Taking a hormone medicine (erythropoietin) that can help to stimulate red blood cell growth. Receiving donated blood through an IV (blood transfusion). This may be needed if you lose a lot of blood. Making changes to your diet. Having surgery to remove your spleen. Follow these instructions at home: Take over-the-counter and prescription medicines only as told by your health care provider. Take supplements only as told by your health care provider. Follow any diet instructions that you were given by your health care provider. Keep all follow-up visits. Your health care provider will want to recheck your blood tests. Contact a health care provider if: You develop new bleeding anywhere in the body. You are very weak. Get help right away if: You are short of breath. You have pain in your abdomen or chest. You are dizzy or feel faint. You have trouble concentrating. You have bloody stools, black stools, or tarry stools. You vomit repeatedly or you vomit up blood. These symptoms may be an emergency. Get help right away. Call 911. Do not wait to see  if the symptoms will go away. Do not drive yourself to the hospital. Summary Anemia is a condition in which you do not have enough red blood cells or enough of a substance in your red blood cells that carries oxygen. Symptoms may occur suddenly or develop slowly. If your anemia is mild, you may not have symptoms. This condition is diagnosed with blood tests, a medical history, and a physical exam. Other tests may be needed. Treatment for this condition depends on the cause of the anemia. This information is not intended to replace advice given to you by your health care provider. Make sure you discuss any questions you have with your health care provider. Document Revised: 02/18/2022 Document Reviewed: 02/18/2022 Elsevier Patient Education  2024 Elsevier Inc.  Chronic Kidney Disease, Adult Chronic kidney disease is when lasting damage happens to the kidneys slowly over a long time. The kidneys help to:  Make pee (urine). Make hormones. Keep the right amount of fluids and chemicals in the body. Most often, this disease does not go away. You must take steps to help keep the kidney damage from getting worse. If steps are not taken, the kidneys might stop working forever. What are the causes? Diabetes. High blood pressure. Diseases that affect the heart and blood vessels. Other kidney diseases. Diseases of the body's disease-fighting system. A problem with the flow of pee. Infections of the organs that make pee, store it, and take it out of the body. Swelling or irritation of your blood vessels. What increases the risk? Getting older. Having someone in your family who has kidney disease or kidney failure. Having a disease caused by genes. Taking medicines often that harm the kidneys. Being near or having contact with harmful substances. Being very overweight. Using tobacco now or in the past. What are the signs or symptoms? Feeling very tired. Having a swollen face, legs, ankles, or  feet. Feeling like you may vomit or vomiting. Not feeling hungry. Being confused or not able to focus. Twitches and cramps in the leg muscles or other muscles. Dry, itchy skin. A taste of metal in your mouth. Making less pee, or making more pee. Shortness of breath. Trouble sleeping. You may also become anemic or get weak bones. Anemic means there is not enough red blood cells or hemoglobin in your blood. You may get symptoms slowly. You may not notice them until the kidney damage gets very bad. How is this treated? Often, there is no cure for this disease. Treatment can help with symptoms and help keep the disease from getting worse. You may need to: Avoid alcohol. Avoid foods that are high in salt, potassium, phosphorous, and protein. Take medicines for symptoms and to help control other conditions. Have dialysis. This treatment gets harmful waste out of your body. Treat other problems that cause your kidney disease or make it worse. Follow these instructions at home: Medicines Take over-the-counter and prescription medicines only as told by your doctor. Do not take any new medicines, vitamins, or supplements unless your doctor says it is okay. Lifestyle  Do not smoke or use any products that contain nicotine or tobacco. If you need help quitting, ask your doctor. If you drink alcohol: Limit how much you use to: 0-1 drink a day for women who are not pregnant. 0-2 drinks a day for men. Know how much alcohol is in your drink. In the [place] one drink equals one 12 oz bottle of beer (355 mL), one 5 oz glass of wine (148 mL), or one 1 oz glass of hard liquor (44 mL). Stay at a healthy weight. If you need help losing weight, ask your doctor. General instructions  Follow instructions from your doctor about what you cannot eat or drink. Track your blood pressure at home. Tell your doctor about any changes. If you have diabetes, track your blood sugar. Exercise at least 30 minutes a  day, 5 days a week. Keep your shots (vaccinations) up to date. Keep all follow-up visits. Where to find more information American Association of Kidney Patients: ResidentialShow.is [employer]: www.kidney.org American Kidney Fund: FightingMatch.com.ee Life Options: www.lifeoptions.org Kidney School: www.kidneyschool.org Contact a doctor if: Your symptoms get worse. You get new symptoms. Get help right away if: You get symptoms of end-stage kidney disease. These include: Headaches. Losing feeling in your hands or feet. Easy bruising. Having hiccups often. Chest pain. Shortness of breath. Lack of menstrual periods,  in women. You have a fever. You make less pee than normal. You have pain or you bleed when you pee or poop. These symptoms may be an emergency. Get help right away. Call your local emergency services (911 in the [place] Do not wait to see if the symptoms will go away. Do not drive yourself to the hospital. Summary Chronic kidney disease is when lasting damage happens to the kidneys slowly over a long time. Causes of this disease include diabetes and high blood pressure. Often, there is no cure for this disease. Treatment can help symptoms and help keep the disease from getting worse. Treatment may involve lifestyle changes, medicines, and dialysis. This information is not intended to replace advice given to you by your health care provider. Make sure you discuss any questions you have with your health care provider. Document Revised: 03/01/2020 Document Reviewed: 03/01/2020 Elsevier Patient Education  2024 [employer].

## 2023-10-18 ENCOUNTER — Other Ambulatory Visit: Payer: Self-pay | Admitting: Student

## 2023-10-18 ENCOUNTER — Other Ambulatory Visit: Payer: Self-pay | Admitting: Family Medicine

## 2023-10-18 DIAGNOSIS — R4189 Other symptoms and signs involving cognitive functions and awareness: Secondary | ICD-10-CM

## 2023-10-20 ENCOUNTER — Ambulatory Visit: Payer: Self-pay | Admitting: Licensed Clinical Social Worker

## 2023-10-20 NOTE — Progress Notes (Unsigned)
    SUBJECTIVE:   CHIEF COMPLAINT / HPI:   Megan Stevenson is an 82yo F w/ hx of HTN and dementia that p/f HTN f/u. Pt present w/ her sister.  HTN - doesn't remember when the last time she took her meds was. - Offered blister pack, but they think it'd be better for granddaughter to continue helping to manage  Dementia - Pt lives alone. Depends on others for transportation. Sister was helping with transportation, but she herself has dementia and her doctor said she shouldn't be helping with pt's care anymore. - Able to make food for herself.  - Granddaughter, [name], comes to help with medications and helps with managing her pill box. - [name] will be the main one helping her in the future.    OBJECTIVE:   BP (!) 159/70   Pulse 70   Ht 5\' 7"  (1.702 m)   Wt 149 lb 6 oz (67.8 kg)   SpO2 100%   BMI 23.40 kg/m   General: Alert, pleasant woman. NAD. HEENT: NCAT. MMM. CV: RRR, no murmurs.  Resp: CTAB, no wheezing or crackles. Normal WOB on RA.  Ext: Moves all ext spontaneously Skin: Warm, well perfused   ASSESSMENT/PLAN:   No problem-specific Assessment & Plan notes found for this encounter.   Assessment & Plan Primary hypertension Uncontrolled.  Unclear if patient is taking her medications.  Offered blister pack, but patient prefers to have granddaughter to help with her meds. - Cont amlodipine and hydral - F/u in 1 wk with granddaughter at visit. Advised to bring pill box and all meds to help sort and manage her meds. - Consider switching hydral to losartan since pt is unlikely to take TID dosed meds. Dementia, unspecified dementia severity, unspecified dementia type, unspecified whether behavioral, psychotic, or mood disturbance or anxiety (HCC) Recommended getting connected with Care Management.  Looks like they were able to make contact with patient, but patient has dementia and does not remember the conversation.  Recommended that her granddaughter be a part of their follow-up  visit, and provided contact information.   Patient Instructions  For Megan Stevenson and her caretakers,  My understanding is that your granddaughter will be helping you with your health and medication management. It is unclear what medications you are currently taking, so I don't want to make any adjustments yet.   1) Please schedule a follow-up appointment in 1 week with your granddaughter. Please bring in ALL of your medications and your pill box so we can help organize it and figure out what meds you are taking.   2) To help you connected with more resources to help take care of you at home, we have sent a referral to our Care Management team. They can help you get connected with resources to help with your care. It looks like they have you scheduled for a phone call on 11/17/2023 at 1pm. It would be helpful if your granddaughter could be part of that phone call so that she can help advocate for your needs. Here is their contact info if you want to reach out for more information or change the appointment:  Danise Edge, BSN RN RN Care Manager  [facility]  Ambulatory Care Management  Direct Number: [phone]     Lincoln Brigham, MD [facility]

## 2023-10-20 NOTE — Telephone Encounter (Signed)
Called patient's granddaughter and rescheduled tomorrow's appointment from nurse visit to office visit with Dr. Sherrilee Gilles.   Veronda Prude, RN

## 2023-10-21 ENCOUNTER — Ambulatory Visit (INDEPENDENT_AMBULATORY_CARE_PROVIDER_SITE_OTHER): Payer: Medicare Other | Admitting: Family Medicine

## 2023-10-21 ENCOUNTER — Ambulatory Visit: Payer: Medicare Other

## 2023-10-21 ENCOUNTER — Encounter: Payer: Self-pay | Admitting: Family Medicine

## 2023-10-21 VITALS — BP 159/70 | HR 70 | Ht 67.0 in | Wt 149.4 lb

## 2023-10-21 DIAGNOSIS — F0390 Unspecified dementia without behavioral disturbance: Secondary | ICD-10-CM

## 2023-10-21 DIAGNOSIS — I10 Essential (primary) hypertension: Secondary | ICD-10-CM | POA: Diagnosis not present

## 2023-10-21 NOTE — Assessment & Plan Note (Signed)
Uncontrolled.  Unclear if patient is taking her medications.  Offered blister pack, but patient prefers to have granddaughter to help with her meds. - Cont amlodipine and hydral - F/u in 1 wk with granddaughter at visit. Advised to bring pill box and all meds to help sort and manage her meds. - Consider switching hydral to losartan since pt is unlikely to take TID dosed meds.

## 2023-10-21 NOTE — Patient Outreach (Signed)
  Care Coordination   Follow Up Visit Note   10/20/2023 Name: Megan Stevenson MRN: [id number] DOB: Apr 10, 1941  Megan Stevenson is a 82 y.o. year old female who sees Megan Simon, MD for primary care. I spoke with  Megan Stevenson by phone today.  What matters to the patients health and wellness today?  LCSW A. Megan Stevenson completed follow up call with pt's granddaughter Megan Stevenson. Advised [name] referral to equity health was sent. LCSW A. Megan Stevenson delivered ensure coupons during pt pcp visit at  family [facility].     Goals Addressed   None     SDOH assessments and interventions completed:  Yes     Care Coordination Interventions:  No, not indicated   Follow up plan: Follow up call scheduled for one week     Encounter Outcome:  Patient Visit Completed    Gwyndolyn Saxon MSW, LCSW Licensed Clinical Social Worker  [facility], Population Health Direct Dial: [phone]  Fax: [phone]

## 2023-10-21 NOTE — Patient Instructions (Addendum)
For Ms Fate and her caretakers,  My understanding is that your granddaughter will be helping you with your health and medication management. It is unclear what medications you are currently taking, so I don't want to make any adjustments yet.   1) Please schedule a follow-up appointment in 1 week with your granddaughter. Please bring in ALL of your medications and your pill box so we can help organize it and figure out what meds you are taking.   2) To help you connected with more resources to help take care of you at home, we have sent a referral to our Care Management team. They can help you get connected with resources to help with your care. It looks like they have you scheduled for a phone call on 11/17/2023 at 1pm. It would be helpful if your granddaughter could be part of that phone call so that she can help advocate for your needs. Here is their contact info if you want to reach out for more information or change the appointment:  Danise Edge, BSN RN RN Care Manager  [facility]  Ambulatory Care Management  Direct Number: [phone]

## 2023-10-21 NOTE — Assessment & Plan Note (Signed)
Recommended getting connected with Care Management.  Looks like they were able to make contact with patient, but patient has dementia and does not remember the conversation.  Recommended that her granddaughter be a part of their follow-up visit, and provided contact information.

## 2023-10-22 ENCOUNTER — Other Ambulatory Visit: Payer: Self-pay | Admitting: Student

## 2023-10-23 ENCOUNTER — Telehealth: Payer: Self-pay | Admitting: *Deleted

## 2023-10-23 NOTE — Patient Outreach (Signed)
  Care Management   Follow Up Note   10/23/2023 Name: Megan Stevenson MRN: [id number] DOB: 1941/01/28   Referred by: Jerre Simon, MD Reason for referral : No chief complaint on file.   RNCM called to follow up with patient granddaughter Megan Stevenson about [employer] patient assistance program. Per [employer], the patient does not meet eligibility for patient assistance at this time. Megan Stevenson verbalized full understanding  Follow Up Plan: Telephone follow up appointment with care management team member scheduled for: 11-17-2023 at 1:00 pm  Danise Edge, BSN RN RN Care Manager  [facility]  Ambulatory Care Management  Direct Number: [phone]

## 2023-10-27 ENCOUNTER — Other Ambulatory Visit: Payer: Self-pay

## 2023-10-27 DIAGNOSIS — F02B11 Dementia in other diseases classified elsewhere, moderate, with agitation: Secondary | ICD-10-CM

## 2023-10-27 MED ORDER — QUETIAPINE FUMARATE 25 MG PO TABS: 25.0000 mg | ORAL_TABLET | Freq: Every day | ORAL | 0 refills | Status: AC

## 2023-10-27 MED ORDER — ESCITALOPRAM OXALATE 5 MG PO TABS: 5.0000 mg | ORAL_TABLET | Freq: Every day | ORAL | 2 refills | Status: AC

## 2023-10-28 ENCOUNTER — Ambulatory Visit: Payer: Self-pay | Admitting: Student

## 2023-10-28 ENCOUNTER — Ambulatory Visit: Payer: Self-pay | Admitting: Licensed Clinical Social Worker

## 2023-10-28 NOTE — Patient Outreach (Signed)
  Care Coordination   Follow Up Visit Note   10/28/2023 Name: Megan Stevenson MRN: [id number] DOB: 17-Oct-1941  Megan Stevenson is a 82 y.o. year old female who sees Jerre Simon, MD for primary care. I spoke with  Ginny Forth by phone today.  What matters to the patients health and wellness today?  LCSW A. Felton Clinton completed follow up with pt's granddaughter Ginny Forth.     Goals Addressed             This Visit's Progress    care coordination       Activities and task to complete in order to accomplish goals.   Delegate task to family and friends to alleviate burnout  I have placed a referral to [employer] [facility] CAP [phone] to establish care.  Look for a packet in the mail from [place].  Referral place with equity health per granddaughter Ginny Forth request.  Submit requested documents to CAPS program         SDOH assessments and interventions completed:  Yes     Care Coordination Interventions:  Yes, provided   Follow up plan: Follow up call scheduled for 11/10/2023    Encounter Outcome:  Patient Visit Completed   Gwyndolyn Saxon MSW, LCSW Licensed Clinical Social Worker  [facility], Population Health Direct Dial: [phone]  Fax: [phone]

## 2023-10-31 ENCOUNTER — Ambulatory Visit: Payer: Medicare Other | Admitting: Student

## 2023-11-05 ENCOUNTER — Ambulatory Visit: Payer: Self-pay | Admitting: Student

## 2023-11-10 DIAGNOSIS — D649 Anemia, unspecified: Secondary | ICD-10-CM | POA: Diagnosis not present

## 2023-11-10 DIAGNOSIS — F419 Anxiety disorder, unspecified: Secondary | ICD-10-CM | POA: Diagnosis not present

## 2023-11-10 DIAGNOSIS — F0390 Unspecified dementia without behavioral disturbance: Secondary | ICD-10-CM | POA: Diagnosis not present

## 2023-11-10 DIAGNOSIS — I10 Essential (primary) hypertension: Secondary | ICD-10-CM | POA: Diagnosis not present

## 2023-11-10 DIAGNOSIS — F339 Major depressive disorder, recurrent, unspecified: Secondary | ICD-10-CM | POA: Diagnosis not present

## 2023-11-10 DIAGNOSIS — E785 Hyperlipidemia, unspecified: Secondary | ICD-10-CM | POA: Diagnosis not present

## 2023-11-17 ENCOUNTER — Ambulatory Visit: Payer: Medicare Other | Admitting: Student

## 2023-11-17 ENCOUNTER — Other Ambulatory Visit: Payer: Self-pay | Admitting: *Deleted

## 2023-11-17 ENCOUNTER — Telehealth: Payer: Self-pay

## 2023-11-17 ENCOUNTER — Encounter: Payer: Self-pay | Admitting: Student

## 2023-11-17 ENCOUNTER — Ambulatory Visit (INDEPENDENT_AMBULATORY_CARE_PROVIDER_SITE_OTHER): Payer: Medicare Other | Admitting: Student

## 2023-11-17 VITALS — BP 144/80 | HR 76 | Temp 97.9°F | Wt 147.8 lb

## 2023-11-17 DIAGNOSIS — I10 Essential (primary) hypertension: Secondary | ICD-10-CM | POA: Diagnosis not present

## 2023-11-17 NOTE — Telephone Encounter (Signed)
[name] returns call to nurse line.   PCP had reached out and LVM to discuss plans and placement.   She was unaware the patient came to an apt today.   Advised will have PCP call her back to discuss.

## 2023-11-17 NOTE — Patient Instructions (Signed)
Visit Information  Thank you for taking time to visit with me today. Please don't hesitate to contact me if I can be of assistance to you before our next scheduled telephone appointment.  Following are the goals we discussed today:   Goals Addressed             This Visit's Progress    RNCM Care Management Expected Outcome: Monitor, Self-Manage and Reduce Symptoms of: HTN, CKD, Dementia, Falls       Current Barriers:  Knowledge Deficits related to plan of care for management of HTN, CKD, Anemia, Dementia, and Falls  Care Coordination needs related to Limited access to caregiver, Cognitive Deficits, Memory Deficits, and Inability to perform IADL's independently Chronic Disease Management support and education needs related to HTN, CKD, Anemia, Dementia, and Falls  Lacks caregiver support Naval architect Deficits  RNCM Clinical Goal(s):  Patient will verbalize understanding of plan for management of HTN, CKD Stage , Anemia, Dementia, and Falls as evidenced by verbal explanation, recognizing symptoms, daily monitoring and compliance  take all medications exactly as prescribed and will call provider for medication related questions as evidenced by compliance with medications and monitoring of any new changes attend all scheduled medical appointments with primary care provider as evidenced by adhering to all appointments to ensure all needs are being met demonstrate Improved and Ongoing adherence to prescribed treatment plan for HTN, CKD, Anemia, Dementia, and Falls as evidenced by consistent medication compliance, symptom monitoring, obtaining 24 hr personal care assistance or SNF placement. Per Megan Stevenson, she is working to obtain CAP services in the home. Patient now has PCA that is with patient during the day. If patient is unable to qualify for CAP services, she will look in to placement. RNCM gave reminder about [employer], when looking for facilities. continue to work with  RN Care Manager to address care management and care coordination needs related to  HTN, CKD, Anemia, Dementia, and Falls as evidenced by adherence to CM Team Scheduled appointments. work with Child psychotherapist to address  related to the management of Limited access to caregiver, Cognitive Deficits, Memory Deficits, and Inability to perform IADL's independently related to the management of Dementia as evidenced by review of EMR and patient or social worker report. Currently waiting for CAP services determination. experience decrease in ED visits as evidenced by EMR review.  ED visits in in last 6 months = 3 through collaboration with RN Care manager, provider, and care team.   Interventions: Evaluation of current treatment plan related to  self management and patient's adherence to plan as established by provider Anemia/Bleeding Interventions:  (Status:  New goal. and Goal on track:  Yes.) Long Term Goal  Assessment of understanding of anemia/bleeding disorder diagnosis  Basic overview and discussion of anemia/bleeding disorder or acute disease state  Medications reviewed  Counseled on avoidance of NSAIDs due to increased bleeding risk Counseled on importance of regular laboratory monitoring as directed by provider Provided education about signs and symptoms of active bleeding such as stomach discomfort, coughing up blood or blood tinged secretions, bleeding from the gums/teeth, nosebleeds, increased bruising, blood in the urine/stool and/or if a traumatic injury occurs, regardless of severity of injury  Advised to call provider or 911 if active bleeding or signs and symptoms of active bleeding occur encouraged strategies to prevent falls related to fatigue, weakness and dizziness; encouraged sitting before standing and using an assistive device encouraged optimal oral intake to support fluid balance and nutrition Lab Results  Component  Value Date   WBC 7.2 09/10/2023   HGB 7.0 (L) 09/10/2023   HCT  24.2 (L) 09/10/2023   MCV 77.6 (L) 09/10/2023   PLT 294 09/10/2023     Chronic Kidney Disease Interventions:  (Status:  New goal. and Goal on track:  Yes.) Long Term Goal Evaluation of current treatment plan related to chronic kidney disease self management and patient's adherence to plan as established by provider      Provided education to patient re: stroke prevention, s/s of heart attack and stroke    Reviewed prescribed diet: Heart Healthy. Per Megan Stevenson, patient continues with poor po intake at this time and she is providing her with additional supplementation with Ensure.  Reviewed medications with patient and discussed importance of compliance. Medications are prefilled by granddaughter to ensure patient is compliant.    Advised patient, providing education and rationale, to monitor blood pressure daily and record, calling PCP for findings outside established parameters    Discussed complications of poorly controlled blood pressure such as heart disease, stroke, circulatory complications, vision complications, kidney impairment, sexual dysfunction    Reviewed scheduled/upcoming provider appointments including    Discussed plans with patient for ongoing care management follow up and provided patient with direct contact information for care management team    Last practice recorded BP readings:  BP Readings from Last 3 Encounters:  11/17/23 (!) 144/80  10/21/23 (!) 159/70  10/14/23 (!) 172/75   Most recent eGFR/CrCl:  Lab Results  Component Value Date   EGFR 16 (L) 08/01/2023    No components found for: "CRCL"   Falls Interventions:  (Status:  New goal. and Goal on track:  Yes.) Long Term Goal Provided written and verbal education re: potential causes of falls and Fall prevention strategies Reviewed medications and discussed potential side effects of medications such as dizziness and frequent urination Advised patient of importance of notifying provider of falls. No recent falls  reported. Assessed for signs and symptoms of orthostatic hypotension.  Assessed patients knowledge of fall risk prevention secondary to previously provided education   Hypertension Interventions:  (Status:  New goal. and Goal on track:  Yes.) Long Term Goal Last practice recorded BP readings:  BP Readings from Last 3 Encounters:  11/17/23 (!) 144/80  10/21/23 (!) 159/70  10/14/23 (!) 172/75   Most recent eGFR/CrCl:  Lab Results  Component Value Date   EGFR 16 (L) 08/01/2023    No components found for: "CRCL"  Evaluation of current treatment plan related to hypertension self management and patient's adherence to plan as established by provider Provided education to patient re: stroke prevention, s/s of heart attack and stroke Reviewed medications with patient and discussed importance of compliance Advised patient, providing education and rationale, to monitor blood pressure daily and record, calling PCP for findings outside established parameters. Per Megan Stevenson, she has not been regularly checking her blood pressure because she has been taking her medication regularly and when she was keeping a log it was always within normal limits. RNCM advised best practice is to continue with keeping a log.  Dementia:  (Status:  New goal.)  Long Term Goal Maintain cognitive function as much as possible. Per Megan Stevenson, patient sleeps a lot during the day and that she typically does not wake her to give her medications. Ensure a balanced diet tailored to the patient's preferences and nutritional needs Monitor fluid intake to prevent dehydration Emotional Support Provided to patient/caregiver, Discussed importance of discussing diagnosis with provider, Discussed importance of attendance  to all provider appointments, and Advised to contact provider for new or worsening symptoms  Patient Goals/Self-Care Activities: Take all medications as prescribed Attend all scheduled provider appointments Call pharmacy  for medication refills 3-7 days in advance of running out of medications Call provider office for new concerns or questions  Work with the social worker to address care coordination needs and will continue to work with the clinical team to address health care and disease management related needs call the Suicide and Crisis Lifeline: 988 call the [place] National Suicide Prevention Lifeline: 323 882 9157 or TTY: 469-659-3876 TTY 941-628-1592) to talk to a trained counselor call 1-800-273-TALK (toll free, 24 hour hotline) go to [facility] [address], [place] [phone]) call 911 if experiencing a Mental Health or Behavioral Health Crisis  check blood pressure daily write blood pressure results in a log or diary keep all doctor appointments report new symptoms to your doctor  Follow Up Plan:  Telephone follow up appointment with care management team member scheduled for:  01-05-2024 at 1:00 pm           Our next appointment is by telephone on 01-05-2024 at 1:00 pm  Please call the care guide team at [phone] if you need to cancel or reschedule your appointment.   If you are experiencing a Mental Health or Behavioral Health Crisis or need someone to talk to, please call the Suicide and Crisis Lifeline: 988 call the [place] National Suicide Prevention Lifeline: 819-213-1586 or TTY: 423-462-8106 TTY 2676450078) to talk to a trained counselor call 1-800-273-TALK (toll free, 24 hour hotline) go to [facility] Ur[facility] [address] [place] [phone])   Patient verbalizes understanding of instructions and care plan provided today and agrees to view in MyChart. Active MyChart status and patient understanding of how to access instructions and care plan via MyChart confirmed with patient.     Telephone follow up appointment with care management team member scheduled for:01-05-2024 at 1:00 pm  Danise Edge, BSN  RN RN Care Manager  [facility]  Ambulatory Care Management  Direct Number: [phone]

## 2023-11-17 NOTE — Progress Notes (Signed)
    SUBJECTIVE:   CHIEF COMPLAINT / HPI:   82 year old female with history of dementia, HTN and CKD 4 Presenting today for BP follow up Doesn't check home BP Lives alone at home Grandaughter [facility]) usually manage patient medication with pill box Patient hasn't taken her medication today BP medication includes amlodipine and hydralazine Patient denies any headache, vision changes, chest pain or shortness of breath     PERTINENT  PMH / PSH: Reviewed  OBJECTIVE:   BP (!) 144/80   Pulse 76   Temp 97.9 F (36.6 C)   Wt 147 lb 12.8 oz (67 kg)   SpO2 100%   BMI 23.15 kg/m    Physical Exam General: Alert, well appearing, NAD Cardiovascular: RRR, No Murmurs, Normal S2/S2 Respiratory: CTAB, No wheezing or Rales Abdomen: No distension or tenderness Extremities: No edema on extremities     ASSESSMENT/PLAN:   Hypertension BP today is mildly elevated but appropriate for patient's age.  Currently asymptomatic.  Show if patient is compliance to her medication given patient has history of dementia and lives alone.  Today she says she did not take her home BP meds.  Attempted to call granddaughter to discuss patient's placement given that referral and orders has been place.  Will like to touch base with granddaughter to see where they are in the process of finding nursing home placement for patient . Attempt to call granddaughter again.     Jerre Simon, MD [facility]

## 2023-11-17 NOTE — Patient Instructions (Signed)
Good to see you today.  Blood pressure today slightly elevated but is appropriate for your age.  Recommend making sure you continue to take your medications as prescribed.  I have left [name] a voicemail to call me to discuss plans and get updates for your placement.

## 2023-11-17 NOTE — Assessment & Plan Note (Signed)
BP today is mildly elevated but appropriate for patient's age.  Currently asymptomatic.  Show if patient is compliance to her medication given patient has history of dementia and lives alone.  Today she says she did not take her home BP meds.  Attempted to call granddaughter to discuss patient's placement given that referral and orders has been place.  Will like to touch base with granddaughter to see where they are in the process of finding nursing home placement for patient . Attempt to call granddaughter again.

## 2023-11-17 NOTE — Patient Outreach (Signed)
Care Management   Visit Note  11/17/2023 Name: Megan Stevenson MRN: [id number] DOB: 12-22-1940  Subjective: Megan Stevenson is a 82 y.o. year old female who is a primary care patient of Jerre Simon, MD. The Care Management team was consulted for assistance.      Engaged with patient spoke with the family member (POA, [place], [place]).    Goals Addressed             This Visit's Progress    RNCM Care Management Expected Outcome: Monitor, Self-Manage and Reduce Symptoms of: HTN, CKD, Dementia, Falls       Current Barriers:  Knowledge Deficits related to plan of care for management of HTN, CKD, Anemia, Dementia, and Falls  Care Coordination needs related to Limited access to caregiver, Cognitive Deficits, Memory Deficits, and Inability to perform IADL's independently Chronic Disease Management support and education needs related to HTN, CKD, Anemia, Dementia, and Falls  Lacks caregiver support Naval architect Deficits  RNCM Clinical Goal(s):  Patient will verbalize understanding of plan for management of HTN, CKD Stage , Anemia, Dementia, and Falls as evidenced by verbal explanation, recognizing symptoms, daily monitoring and compliance  take all medications exactly as prescribed and will call provider for medication related questions as evidenced by compliance with medications and monitoring of any new changes attend all scheduled medical appointments with primary care provider as evidenced by adhering to all appointments to ensure all needs are being met demonstrate Improved and Ongoing adherence to prescribed treatment plan for HTN, CKD, Anemia, Dementia, and Falls as evidenced by consistent medication compliance, symptom monitoring, obtaining 24 hr personal care assistance or SNF placement. Per [name], she is working to obtain CAP services in the home. Patient now has PCA that is with patient during the day. If patient is unable to qualify for CAP services, she will  look in to placement. RNCM gave reminder about [employer], when looking for facilities. continue to work with RN Care Manager to address care management and care coordination needs related to  HTN, CKD, Anemia, Dementia, and Falls as evidenced by adherence to CM Team Scheduled appointments. work with Child psychotherapist to address  related to the management of Limited access to caregiver, Cognitive Deficits, Memory Deficits, and Inability to perform IADL's independently related to the management of Dementia as evidenced by review of EMR and patient or social worker report. Currently waiting for CAP services determination. experience decrease in ED visits as evidenced by EMR review.  ED visits in in last 6 months = 3 through collaboration with RN Care manager, provider, and care team.   Interventions: Evaluation of current treatment plan related to  self management and patient's adherence to plan as established by provider Anemia/Bleeding Interventions:  (Status:  New goal. and Goal on track:  Yes.) Long Term Goal  Assessment of understanding of anemia/bleeding disorder diagnosis  Basic overview and discussion of anemia/bleeding disorder or acute disease state  Medications reviewed  Counseled on avoidance of NSAIDs due to increased bleeding risk Counseled on importance of regular laboratory monitoring as directed by provider Provided education about signs and symptoms of active bleeding such as stomach discomfort, coughing up blood or blood tinged secretions, bleeding from the gums/teeth, nosebleeds, increased bruising, blood in the urine/stool and/or if a traumatic injury occurs, regardless of severity of injury  Advised to call provider or 911 if active bleeding or signs and symptoms of active bleeding occur encouraged strategies to prevent falls related to fatigue, weakness and dizziness; encouraged  sitting before standing and using an assistive device encouraged optimal oral intake to support fluid  balance and nutrition Lab Results  Component Value Date   WBC 7.2 09/10/2023   HGB 7.0 (L) 09/10/2023   HCT 24.2 (L) 09/10/2023   MCV 77.6 (L) 09/10/2023   PLT 294 09/10/2023     Chronic Kidney Disease Interventions:  (Status:  New goal. and Goal on track:  Yes.) Long Term Goal Evaluation of current treatment plan related to chronic kidney disease self management and patient's adherence to plan as established by provider      Provided education to patient re: stroke prevention, s/s of heart attack and stroke    Reviewed prescribed diet: Heart Healthy. Per [name], patient continues with poor po intake at this time and she is providing her with additional supplementation with Ensure.  Reviewed medications with patient and discussed importance of compliance. Medications are prefilled by granddaughter to ensure patient is compliant.    Advised patient, providing education and rationale, to monitor blood pressure daily and record, calling PCP for findings outside established parameters    Discussed complications of poorly controlled blood pressure such as heart disease, stroke, circulatory complications, vision complications, kidney impairment, sexual dysfunction    Reviewed scheduled/upcoming provider appointments including    Discussed plans with patient for ongoing care management follow up and provided patient with direct contact information for care management team    Last practice recorded BP readings:  BP Readings from Last 3 Encounters:  11/17/23 (!) 144/80  10/21/23 (!) 159/70  10/14/23 (!) 172/75   Most recent eGFR/CrCl:  Lab Results  Component Value Date   EGFR 16 (L) 08/01/2023    No components found for: "CRCL"   Falls Interventions:  (Status:  New goal. and Goal on track:  Yes.) Long Term Goal Provided written and verbal education re: potential causes of falls and Fall prevention strategies Reviewed medications and discussed potential side effects of medications such as  dizziness and frequent urination Advised patient of importance of notifying provider of falls. No recent falls reported. Assessed for signs and symptoms of orthostatic hypotension.  Assessed patients knowledge of fall risk prevention secondary to previously provided education   Hypertension Interventions:  (Status:  New goal. and Goal on track:  Yes.) Long Term Goal Last practice recorded BP readings:  BP Readings from Last 3 Encounters:  11/17/23 (!) 144/80  10/21/23 (!) 159/70  10/14/23 (!) 172/75   Most recent eGFR/CrCl:  Lab Results  Component Value Date   EGFR 16 (L) 08/01/2023    No components found for: "CRCL"  Evaluation of current treatment plan related to hypertension self management and patient's adherence to plan as established by provider Provided education to patient re: stroke prevention, s/s of heart attack and stroke Reviewed medications with patient and discussed importance of compliance Advised patient, providing education and rationale, to monitor blood pressure daily and record, calling PCP for findings outside established parameters. Per [name], she has not been regularly checking her blood pressure because she has been taking her medication regularly and when she was keeping a log it was always within normal limits. RNCM advised best practice is to continue with keeping a log.  Dementia:  (Status:  New goal.)  Long Term Goal Maintain cognitive function as much as possible. Per [name], patient sleeps a lot during the day and that she typically does not wake her to give her medications. Ensure a balanced diet tailored to the patient's preferences and nutritional needs  Monitor fluid intake to prevent dehydration Emotional Support Provided to patient/caregiver, Discussed importance of discussing diagnosis with provider, Discussed importance of attendance to all provider appointments, and Advised to contact provider for new or worsening symptoms  Patient  Goals/Self-Care Activities: Take all medications as prescribed Attend all scheduled provider appointments Call pharmacy for medication refills 3-7 days in advance of running out of medications Call provider office for new concerns or questions  Work with the social worker to address care coordination needs and will continue to work with the clinical team to address health care and disease management related needs call the Suicide and Crisis Lifeline: 988 call the [place] National Suicide Prevention Lifeline: 845-520-6809 or TTY: 984-134-6707 TTY 847-764-3752) to talk to a trained counselor call 1-800-273-TALK (toll free, 24 hour hotline) go to [facility] [address] [place] [phone]) call 911 if experiencing a Mental Health or Behavioral Health Crisis  check blood pressure daily write blood pressure results in a log or diary keep all doctor appointments report new symptoms to your doctor  Follow Up Plan:  Telephone follow up appointment with care management team member scheduled for:  01-05-2024 at 1:00 pm             Consent to Services:  Patient was given information about care management services, agreed to services, and gave verbal consent to participate.   Plan: Telephone follow up appointment with care management team member scheduled for:01-05-2024 at 1:00 pm  Danise Edge, BSN RN RN Care Manager  [facility]  Ambulatory Care Management  Direct Number: [phone]

## 2023-12-01 DIAGNOSIS — F339 Major depressive disorder, recurrent, unspecified: Secondary | ICD-10-CM | POA: Diagnosis not present

## 2023-12-01 DIAGNOSIS — E785 Hyperlipidemia, unspecified: Secondary | ICD-10-CM | POA: Diagnosis not present

## 2023-12-01 DIAGNOSIS — M1990 Unspecified osteoarthritis, unspecified site: Secondary | ICD-10-CM | POA: Diagnosis not present

## 2023-12-01 DIAGNOSIS — D649 Anemia, unspecified: Secondary | ICD-10-CM | POA: Diagnosis not present

## 2023-12-01 DIAGNOSIS — F0390 Unspecified dementia without behavioral disturbance: Secondary | ICD-10-CM | POA: Diagnosis not present

## 2023-12-01 DIAGNOSIS — I10 Essential (primary) hypertension: Secondary | ICD-10-CM | POA: Diagnosis not present

## 2023-12-01 DIAGNOSIS — F419 Anxiety disorder, unspecified: Secondary | ICD-10-CM | POA: Diagnosis not present

## 2023-12-08 DIAGNOSIS — N184 Chronic kidney disease, stage 4 (severe): Secondary | ICD-10-CM | POA: Diagnosis not present

## 2023-12-08 DIAGNOSIS — F0390 Unspecified dementia without behavioral disturbance: Secondary | ICD-10-CM | POA: Diagnosis not present

## 2023-12-08 DIAGNOSIS — M170 Bilateral primary osteoarthritis of knee: Secondary | ICD-10-CM | POA: Diagnosis not present

## 2023-12-08 DIAGNOSIS — E785 Hyperlipidemia, unspecified: Secondary | ICD-10-CM | POA: Diagnosis not present

## 2023-12-08 DIAGNOSIS — R54 Age-related physical debility: Secondary | ICD-10-CM | POA: Diagnosis not present

## 2023-12-08 DIAGNOSIS — F339 Major depressive disorder, recurrent, unspecified: Secondary | ICD-10-CM | POA: Diagnosis not present

## 2023-12-08 DIAGNOSIS — I129 Hypertensive chronic kidney disease with stage 1 through stage 4 chronic kidney disease, or unspecified chronic kidney disease: Secondary | ICD-10-CM | POA: Diagnosis not present

## 2023-12-08 DIAGNOSIS — D509 Iron deficiency anemia, unspecified: Secondary | ICD-10-CM | POA: Diagnosis not present

## 2023-12-29 ENCOUNTER — Encounter (HOSPITAL_COMMUNITY): Payer: Self-pay

## 2023-12-29 DIAGNOSIS — D509 Iron deficiency anemia, unspecified: Secondary | ICD-10-CM | POA: Diagnosis not present

## 2023-12-29 DIAGNOSIS — N184 Chronic kidney disease, stage 4 (severe): Secondary | ICD-10-CM | POA: Diagnosis not present

## 2023-12-29 DIAGNOSIS — E785 Hyperlipidemia, unspecified: Secondary | ICD-10-CM | POA: Diagnosis not present

## 2023-12-29 DIAGNOSIS — M170 Bilateral primary osteoarthritis of knee: Secondary | ICD-10-CM | POA: Diagnosis not present

## 2023-12-29 DIAGNOSIS — F0390 Unspecified dementia without behavioral disturbance: Secondary | ICD-10-CM | POA: Diagnosis not present

## 2023-12-29 DIAGNOSIS — I129 Hypertensive chronic kidney disease with stage 1 through stage 4 chronic kidney disease, or unspecified chronic kidney disease: Secondary | ICD-10-CM | POA: Diagnosis not present

## 2023-12-29 DIAGNOSIS — F339 Major depressive disorder, recurrent, unspecified: Secondary | ICD-10-CM | POA: Diagnosis not present

## 2023-12-29 DIAGNOSIS — R54 Age-related physical debility: Secondary | ICD-10-CM | POA: Diagnosis not present

## 2023-12-30 DIAGNOSIS — D509 Iron deficiency anemia, unspecified: Secondary | ICD-10-CM | POA: Diagnosis not present

## 2023-12-30 DIAGNOSIS — F0390 Unspecified dementia without behavioral disturbance: Secondary | ICD-10-CM | POA: Diagnosis not present

## 2023-12-30 DIAGNOSIS — I10 Essential (primary) hypertension: Secondary | ICD-10-CM | POA: Diagnosis not present

## 2023-12-30 DIAGNOSIS — E785 Hyperlipidemia, unspecified: Secondary | ICD-10-CM | POA: Diagnosis not present

## 2024-01-05 ENCOUNTER — Other Ambulatory Visit: Payer: Self-pay | Admitting: *Deleted

## 2024-01-05 NOTE — Patient Instructions (Signed)
Visit Information  Thank you for taking time to visit with me today. Please don't hesitate to contact me if I can be of assistance to you before our next scheduled telephone appointment.  Following are the goals we discussed today:   Goals Addressed             This Visit's Progress    COMPLETED: RNCM Care Management Expected Outcome: Monitor, Self-Manage and Reduce Symptoms of: HTN, CKD, Dementia, Falls       Current Barriers:  Knowledge Deficits related to plan of care for management of HTN, CKD, Anemia, Dementia, and Falls  Care Coordination needs related to Limited access to caregiver, Cognitive Deficits, Memory Deficits, and Inability to perform IADL's independently Chronic Disease Management support and education needs related to HTN, CKD, Anemia, Dementia, and Falls  Lacks caregiver support Naval architect Deficits  RNCM Clinical Goal(s):  Patient will verbalize understanding of plan for management of HTN, CKD Stage , Anemia, Dementia, and Falls as evidenced by verbal explanation, recognizing symptoms, daily monitoring and compliance  take all medications exactly as prescribed and will call provider for medication related questions as evidenced by compliance with medications and monitoring of any new changes attend all scheduled medical appointments with primary care provider as evidenced by adhering to all appointments to ensure all needs are being met demonstrate Improved and Ongoing adherence to prescribed treatment plan for HTN, CKD, Anemia, Dementia, and Falls as evidenced by consistent medication compliance, symptom monitoring, obtaining 24 hr personal care assistance or SNF placement.  continue to work with RN Care Manager to address care management and care coordination needs related to  HTN, CKD, Anemia, Dementia, and Falls as evidenced by adherence to CM Team Scheduled appointments. work with Child psychotherapist to address  related to the management of Limited  access to caregiver, Cognitive Deficits, Memory Deficits, and Inability to perform IADL's independently related to the management of Dementia as evidenced by review of EMR and patient or social worker report.  experience decrease in ED visits as evidenced by EMR review.  ED visits in in last 6 months = 3 through collaboration with RN Care manager, provider, and care team.   Interventions: Evaluation of current treatment plan related to  self management and patient's adherence to plan as established by provider  Anemia/Bleeding Interventions:  (Status:  Condition stable.  Not addressed this visit.) Long Term Goal  Assessment of understanding of anemia/bleeding disorder diagnosis  Basic overview and discussion of anemia/bleeding disorder or acute disease state  Medications reviewed  Counseled on avoidance of NSAIDs due to increased bleeding risk Counseled on importance of regular laboratory monitoring as directed by provider Provided education about signs and symptoms of active bleeding such as stomach discomfort, coughing up blood or blood tinged secretions, bleeding from the gums/teeth, nosebleeds, increased bruising, blood in the urine/stool and/or if a traumatic injury occurs, regardless of severity of injury  Advised to call provider or 911 if active bleeding or signs and symptoms of active bleeding occur encouraged strategies to prevent falls related to fatigue, weakness and dizziness; encouraged sitting before standing and using an assistive device encouraged optimal oral intake to support fluid balance and nutrition Lab Results  Component Value Date   WBC 7.2 09/10/2023   HGB 7.0 (L) 09/10/2023   HCT 24.2 (L) 09/10/2023   MCV 77.6 (L) 09/10/2023   PLT 294 09/10/2023     Chronic Kidney Disease Interventions:  (Status:  Condition stable.  Not addressed this visit.) Long Term  Goal Evaluation of current treatment plan related to chronic kidney disease self management and patient's  adherence to plan as established by provider      Provided education to patient re: stroke prevention, s/s of heart attack and stroke    Reviewed prescribed diet: Heart Healthy.  Reviewed medications with patient and discussed importance of compliance. Medications have been prepackaged by the pharmacy Advised patient, providing education and rationale, to monitor blood pressure daily and record, calling PCP for findings outside established parameters    Discussed complications of poorly controlled blood pressure such as heart disease, stroke, circulatory complications, vision complications, kidney impairment, sexual dysfunction    Reviewed scheduled/upcoming provider appointments including    Discussed plans with patient for ongoing care management follow up and provided patient with direct contact information for care management team    Last practice recorded BP readings:  BP Readings from Last 3 Encounters:  11/17/23 (!) 144/80  10/21/23 (!) 159/70  10/14/23 (!) 172/75   Most recent eGFR/CrCl:  Lab Results  Component Value Date   EGFR 16 (L) 08/01/2023    No components found for: "CRCL"   Falls Interventions:  (Status:  Condition stable.  Not addressed this visit.) Long Term Goal Provided written and verbal education re: potential causes of falls and Fall prevention strategies Reviewed medications and discussed potential side effects of medications such as dizziness and frequent urination Advised patient of importance of notifying provider of falls. No recent falls reported. Assessed for signs and symptoms of orthostatic hypotension.  Assessed patients knowledge of fall risk prevention secondary to previously provided education   Hypertension Interventions:  (Status:  Goal on track:  Yes.) Long Term Goal Last practice recorded BP readings:  BP Readings from Last 3 Encounters:  11/17/23 (!) 144/80  10/21/23 (!) 159/70  10/14/23 (!) 172/75   Most recent eGFR/CrCl:  Lab Results   Component Value Date   EGFR 16 (L) 08/01/2023    No components found for: "CRCL"  Evaluation of current treatment plan related to hypertension self management and patient's adherence to plan as established by provider Provided education to patient re: stroke prevention, s/s of heart attack and stroke Reviewed medications with patient and discussed importance of compliance Advised patient, providing education and rationale, to monitor blood pressure daily and record, calling PCP for findings outside established parameters. Per [name], [facility] has made some changes to patients medications and they are now coming prepackaged. She reports that they are following her closely and all of her labs and Bp have been fine.   Dementia:  (Status:  Condition stable.  Not addressed this visit.)  Long Term Goal Maintain cognitive function as much as possible.  Ensure a balanced diet tailored to the patient's preferences and nutritional needs Monitor fluid intake to prevent dehydration Emotional Support Provided to patient/caregiver, Discussed importance of discussing diagnosis with provider, Discussed importance of attendance to all provider appointments, and Advised to contact provider for new or worsening symptoms  Patient Goals/Self-Care Activities: Take all medications as prescribed Attend all scheduled provider appointments Call pharmacy for medication refills 3-7 days in advance of running out of medications Call provider office for new concerns or questions  Work with the social worker to address care coordination needs and will continue to work with the clinical team to address health care and disease management related needs call the Suicide and Crisis Lifeline: 988 call the [place] National Suicide Prevention Lifeline: (930) 573-3072 or TTY: 910-232-5253 TTY 616-023-8661) to talk to a trained  counselor call 1-800-273-TALK (toll free, 24 hour hotline) go to [facility]  Urgent Care [address], [place] [phone]) call 911 if experiencing a Mental Health or Behavioral Health Crisis  check blood pressure daily write blood pressure results in a log or diary keep all doctor appointments report new symptoms to your doctor  Follow Up Plan:  No further follow up required: Patient primary care provider is now [facility]            Please call the care guide team at [phone] if you need to cancel or reschedule your appointment.   If you are experiencing a Mental Health or Behavioral Health Crisis or need someone to talk to, please call the Suicide and Crisis Lifeline: 988 call the [place] National Suicide Prevention Lifeline: 8288604050 or TTY: (434)720-7275 TTY (315) 248-3579) to talk to a trained counselor call 1-800-273-TALK (toll free, 24 hour hotline) go to [facility] [address] [place] [phone])   Patient verbalizes understanding of instructions and care plan provided today and agrees to view in MyChart. Active MyChart status and patient understanding of how to access instructions and care plan via MyChart confirmed with patient.     No further follow up required: Under the care of [facility]  Rosalene Billings, BSN RN [facility]  [facility] [place], [facility] RN Care Manager Direct Dial: [phone]  Fax: [phone]

## 2024-01-05 NOTE — Patient Outreach (Signed)
Care Management   Visit Note  01/05/2024 Name: Megan Stevenson MRN: [id number] DOB: 11/12/1941  Subjective: Megan Stevenson is a 83 y.o. year old female who is a primary care patient of Jerre Simon, MD. The Care Management team was consulted for assistance.      Engaged with patient spoke with the family member (POA, [place], [place]).    Goals Addressed             This Visit's Progress    COMPLETED: RNCM Care Management Expected Outcome: Monitor, Self-Manage and Reduce Symptoms of: HTN, CKD, Dementia, Falls       Current Barriers:  Knowledge Deficits related to plan of care for management of HTN, CKD, Anemia, Dementia, and Falls  Care Coordination needs related to Limited access to caregiver, Cognitive Deficits, Memory Deficits, and Inability to perform IADL's independently Chronic Disease Management support and education needs related to HTN, CKD, Anemia, Dementia, and Falls  Lacks caregiver support Naval architect Deficits  RNCM Clinical Goal(s):  Patient will verbalize understanding of plan for management of HTN, CKD Stage , Anemia, Dementia, and Falls as evidenced by verbal explanation, recognizing symptoms, daily monitoring and compliance  take all medications exactly as prescribed and will call provider for medication related questions as evidenced by compliance with medications and monitoring of any new changes attend all scheduled medical appointments with primary care provider as evidenced by adhering to all appointments to ensure all needs are being met demonstrate Improved and Ongoing adherence to prescribed treatment plan for HTN, CKD, Anemia, Dementia, and Falls as evidenced by consistent medication compliance, symptom monitoring, obtaining 24 hr personal care assistance or SNF placement.  continue to work with RN Care Manager to address care management and care coordination needs related to  HTN, CKD, Anemia, Dementia, and Falls as evidenced by adherence  to CM Team Scheduled appointments. work with Child psychotherapist to address  related to the management of Limited access to caregiver, Cognitive Deficits, Memory Deficits, and Inability to perform IADL's independently related to the management of Dementia as evidenced by review of EMR and patient or social worker report.  experience decrease in ED visits as evidenced by EMR review.  ED visits in in last 6 months = 3 through collaboration with RN Care manager, provider, and care team.   Interventions: Evaluation of current treatment plan related to  self management and patient's adherence to plan as established by provider  Anemia/Bleeding Interventions:  (Status:  Condition stable.  Not addressed this visit.) Long Term Goal  Assessment of understanding of anemia/bleeding disorder diagnosis  Basic overview and discussion of anemia/bleeding disorder or acute disease state  Medications reviewed  Counseled on avoidance of NSAIDs due to increased bleeding risk Counseled on importance of regular laboratory monitoring as directed by provider Provided education about signs and symptoms of active bleeding such as stomach discomfort, coughing up blood or blood tinged secretions, bleeding from the gums/teeth, nosebleeds, increased bruising, blood in the urine/stool and/or if a traumatic injury occurs, regardless of severity of injury  Advised to call provider or 911 if active bleeding or signs and symptoms of active bleeding occur encouraged strategies to prevent falls related to fatigue, weakness and dizziness; encouraged sitting before standing and using an assistive device encouraged optimal oral intake to support fluid balance and nutrition Lab Results  Component Value Date   WBC 7.2 09/10/2023   HGB 7.0 (L) 09/10/2023   HCT 24.2 (L) 09/10/2023   MCV 77.6 (L) 09/10/2023   PLT  294 09/10/2023     Chronic Kidney Disease Interventions:  (Status:  Condition stable.  Not addressed this visit.) Long Term  Goal Evaluation of current treatment plan related to chronic kidney disease self management and patient's adherence to plan as established by provider      Provided education to patient re: stroke prevention, s/s of heart attack and stroke    Reviewed prescribed diet: Heart Healthy.  Reviewed medications with patient and discussed importance of compliance. Medications have been prepackaged by the pharmacy Advised patient, providing education and rationale, to monitor blood pressure daily and record, calling PCP for findings outside established parameters    Discussed complications of poorly controlled blood pressure such as heart disease, stroke, circulatory complications, vision complications, kidney impairment, sexual dysfunction    Reviewed scheduled/upcoming provider appointments including    Discussed plans with patient for ongoing care management follow up and provided patient with direct contact information for care management team    Last practice recorded BP readings:  BP Readings from Last 3 Encounters:  11/17/23 (!) 144/80  10/21/23 (!) 159/70  10/14/23 (!) 172/75   Most recent eGFR/CrCl:  Lab Results  Component Value Date   EGFR 16 (L) 08/01/2023    No components found for: "CRCL"   Falls Interventions:  (Status:  Condition stable.  Not addressed this visit.) Long Term Goal Provided written and verbal education re: potential causes of falls and Fall prevention strategies Reviewed medications and discussed potential side effects of medications such as dizziness and frequent urination Advised patient of importance of notifying provider of falls. No recent falls reported. Assessed for signs and symptoms of orthostatic hypotension.  Assessed patients knowledge of fall risk prevention secondary to previously provided education   Hypertension Interventions:  (Status:  Goal on track:  Yes.) Long Term Goal Last practice recorded BP readings:  BP Readings from Last 3 Encounters:   11/17/23 (!) 144/80  10/21/23 (!) 159/70  10/14/23 (!) 172/75   Most recent eGFR/CrCl:  Lab Results  Component Value Date   EGFR 16 (L) 08/01/2023    No components found for: "CRCL"  Evaluation of current treatment plan related to hypertension self management and patient's adherence to plan as established by provider Provided education to patient re: stroke prevention, s/s of heart attack and stroke Reviewed medications with patient and discussed importance of compliance Advised patient, providing education and rationale, to monitor blood pressure daily and record, calling PCP for findings outside established parameters. Per [name], [facility] has made some changes to patients medications and they are now coming prepackaged. She reports that they are following her closely and all of her labs and Bp have been fine.   Dementia:  (Status:  Condition stable.  Not addressed this visit.)  Long Term Goal Maintain cognitive function as much as possible.  Ensure a balanced diet tailored to the patient's preferences and nutritional needs Monitor fluid intake to prevent dehydration Emotional Support Provided to patient/caregiver, Discussed importance of discussing diagnosis with provider, Discussed importance of attendance to all provider appointments, and Advised to contact provider for new or worsening symptoms  Patient Goals/Self-Care Activities: Take all medications as prescribed Attend all scheduled provider appointments Call pharmacy for medication refills 3-7 days in advance of running out of medications Call provider office for new concerns or questions  Work with the social worker to address care coordination needs and will continue to work with the clinical team to address health care and disease management related needs call the Suicide  and Crisis Lifeline: 988 call the [place] National Suicide Prevention Lifeline: 236 243 1903 or TTY: (774)770-3820 TTY 670-694-9267) to talk to a  trained counselor call 1-800-273-TALK (toll free, 24 hour hotline) go to [facility] [address], [place] [phone]) call 911 if experiencing a Mental Health or Behavioral Health Crisis  check blood pressure daily write blood pressure results in a log or diary keep all doctor appointments report new symptoms to your doctor  Follow Up Plan:  No further follow up required: Patient primary care provider is now [facility]          Consent to Services:  Patient was given information about care management services, agreed to services, and gave verbal consent to participate.   Plan: No further follow up required: Patient is under the care of [facility]   Rosalene Billings, BSN RN [facility]  [facility], [facility] RN Care Manager Direct Dial: [phone]  Fax: [phone]

## 2024-01-26 DIAGNOSIS — R001 Bradycardia, unspecified: Secondary | ICD-10-CM | POA: Diagnosis not present

## 2024-01-26 DIAGNOSIS — I10 Essential (primary) hypertension: Secondary | ICD-10-CM | POA: Diagnosis not present

## 2024-01-26 DIAGNOSIS — D509 Iron deficiency anemia, unspecified: Secondary | ICD-10-CM | POA: Diagnosis not present

## 2024-01-26 DIAGNOSIS — F0390 Unspecified dementia without behavioral disturbance: Secondary | ICD-10-CM | POA: Diagnosis not present

## 2024-01-26 DIAGNOSIS — E785 Hyperlipidemia, unspecified: Secondary | ICD-10-CM | POA: Diagnosis not present

## 2024-01-26 DIAGNOSIS — M1990 Unspecified osteoarthritis, unspecified site: Secondary | ICD-10-CM | POA: Diagnosis not present

## 2024-01-28 DIAGNOSIS — F0390 Unspecified dementia without behavioral disturbance: Secondary | ICD-10-CM | POA: Diagnosis not present

## 2024-01-28 DIAGNOSIS — R54 Age-related physical debility: Secondary | ICD-10-CM | POA: Diagnosis not present

## 2024-01-28 DIAGNOSIS — F339 Major depressive disorder, recurrent, unspecified: Secondary | ICD-10-CM | POA: Diagnosis not present

## 2024-01-28 DIAGNOSIS — I129 Hypertensive chronic kidney disease with stage 1 through stage 4 chronic kidney disease, or unspecified chronic kidney disease: Secondary | ICD-10-CM | POA: Diagnosis not present

## 2024-01-28 DIAGNOSIS — M170 Bilateral primary osteoarthritis of knee: Secondary | ICD-10-CM | POA: Diagnosis not present

## 2024-01-28 DIAGNOSIS — N184 Chronic kidney disease, stage 4 (severe): Secondary | ICD-10-CM | POA: Diagnosis not present

## 2024-01-28 DIAGNOSIS — E785 Hyperlipidemia, unspecified: Secondary | ICD-10-CM | POA: Diagnosis not present

## 2024-01-28 DIAGNOSIS — D509 Iron deficiency anemia, unspecified: Secondary | ICD-10-CM | POA: Diagnosis not present

## 2024-02-25 ENCOUNTER — Other Ambulatory Visit: Payer: Self-pay | Admitting: Student

## 2024-02-26 DIAGNOSIS — F0390 Unspecified dementia without behavioral disturbance: Secondary | ICD-10-CM | POA: Diagnosis not present

## 2024-02-26 DIAGNOSIS — I10 Essential (primary) hypertension: Secondary | ICD-10-CM | POA: Diagnosis not present

## 2024-02-26 DIAGNOSIS — M545 Low back pain, unspecified: Secondary | ICD-10-CM | POA: Diagnosis not present

## 2024-03-08 DIAGNOSIS — E785 Hyperlipidemia, unspecified: Secondary | ICD-10-CM | POA: Diagnosis not present

## 2024-03-08 DIAGNOSIS — N184 Chronic kidney disease, stage 4 (severe): Secondary | ICD-10-CM | POA: Diagnosis not present

## 2024-03-08 DIAGNOSIS — D509 Iron deficiency anemia, unspecified: Secondary | ICD-10-CM | POA: Diagnosis not present

## 2024-03-08 DIAGNOSIS — F0390 Unspecified dementia without behavioral disturbance: Secondary | ICD-10-CM | POA: Diagnosis not present

## 2024-03-08 DIAGNOSIS — F339 Major depressive disorder, recurrent, unspecified: Secondary | ICD-10-CM | POA: Diagnosis not present

## 2024-03-08 DIAGNOSIS — I129 Hypertensive chronic kidney disease with stage 1 through stage 4 chronic kidney disease, or unspecified chronic kidney disease: Secondary | ICD-10-CM | POA: Diagnosis not present

## 2024-03-08 DIAGNOSIS — R54 Age-related physical debility: Secondary | ICD-10-CM | POA: Diagnosis not present

## 2024-03-08 DIAGNOSIS — M170 Bilateral primary osteoarthritis of knee: Secondary | ICD-10-CM | POA: Diagnosis not present

## 2024-03-31 DIAGNOSIS — F0390 Unspecified dementia without behavioral disturbance: Secondary | ICD-10-CM | POA: Diagnosis not present

## 2024-03-31 DIAGNOSIS — R54 Age-related physical debility: Secondary | ICD-10-CM | POA: Diagnosis not present

## 2024-03-31 DIAGNOSIS — N184 Chronic kidney disease, stage 4 (severe): Secondary | ICD-10-CM | POA: Diagnosis not present

## 2024-03-31 DIAGNOSIS — I129 Hypertensive chronic kidney disease with stage 1 through stage 4 chronic kidney disease, or unspecified chronic kidney disease: Secondary | ICD-10-CM | POA: Diagnosis not present

## 2024-04-07 DIAGNOSIS — F0390 Unspecified dementia without behavioral disturbance: Secondary | ICD-10-CM | POA: Diagnosis not present

## 2024-04-07 DIAGNOSIS — F339 Major depressive disorder, recurrent, unspecified: Secondary | ICD-10-CM | POA: Diagnosis not present

## 2024-04-07 DIAGNOSIS — E785 Hyperlipidemia, unspecified: Secondary | ICD-10-CM | POA: Diagnosis not present

## 2024-04-07 DIAGNOSIS — D509 Iron deficiency anemia, unspecified: Secondary | ICD-10-CM | POA: Diagnosis not present

## 2024-04-07 DIAGNOSIS — M170 Bilateral primary osteoarthritis of knee: Secondary | ICD-10-CM | POA: Diagnosis not present

## 2024-04-07 DIAGNOSIS — R54 Age-related physical debility: Secondary | ICD-10-CM | POA: Diagnosis not present

## 2024-04-07 DIAGNOSIS — I129 Hypertensive chronic kidney disease with stage 1 through stage 4 chronic kidney disease, or unspecified chronic kidney disease: Secondary | ICD-10-CM | POA: Diagnosis not present

## 2024-04-07 DIAGNOSIS — N184 Chronic kidney disease, stage 4 (severe): Secondary | ICD-10-CM | POA: Diagnosis not present

## 2024-04-26 DIAGNOSIS — F0390 Unspecified dementia without behavioral disturbance: Secondary | ICD-10-CM | POA: Diagnosis not present

## 2024-04-26 DIAGNOSIS — I129 Hypertensive chronic kidney disease with stage 1 through stage 4 chronic kidney disease, or unspecified chronic kidney disease: Secondary | ICD-10-CM | POA: Diagnosis not present

## 2024-04-26 DIAGNOSIS — F339 Major depressive disorder, recurrent, unspecified: Secondary | ICD-10-CM | POA: Diagnosis not present

## 2024-04-26 DIAGNOSIS — E785 Hyperlipidemia, unspecified: Secondary | ICD-10-CM | POA: Diagnosis not present

## 2024-04-26 DIAGNOSIS — M1990 Unspecified osteoarthritis, unspecified site: Secondary | ICD-10-CM | POA: Diagnosis not present

## 2024-04-26 DIAGNOSIS — N184 Chronic kidney disease, stage 4 (severe): Secondary | ICD-10-CM | POA: Diagnosis not present

## 2024-04-26 DIAGNOSIS — R54 Age-related physical debility: Secondary | ICD-10-CM | POA: Diagnosis not present

## 2024-04-28 DIAGNOSIS — D509 Iron deficiency anemia, unspecified: Secondary | ICD-10-CM | POA: Diagnosis not present

## 2024-04-28 DIAGNOSIS — E785 Hyperlipidemia, unspecified: Secondary | ICD-10-CM | POA: Diagnosis not present

## 2024-04-28 DIAGNOSIS — N184 Chronic kidney disease, stage 4 (severe): Secondary | ICD-10-CM | POA: Diagnosis not present

## 2024-04-29 ENCOUNTER — Emergency Department (HOSPITAL_COMMUNITY)
Admission: EM | Admit: 2024-04-29 | Discharge: 2024-04-29 | Disposition: A | Discharge status: Home / Self Care | Attending: Emergency Medicine | Admitting: Emergency Medicine

## 2024-04-29 ENCOUNTER — Other Ambulatory Visit: Payer: Self-pay

## 2024-04-29 ENCOUNTER — Emergency Department (HOSPITAL_COMMUNITY)

## 2024-04-29 ENCOUNTER — Encounter (HOSPITAL_COMMUNITY): Payer: Self-pay

## 2024-04-29 DIAGNOSIS — I10 Essential (primary) hypertension: Secondary | ICD-10-CM

## 2024-04-29 DIAGNOSIS — N184 Chronic kidney disease, stage 4 (severe): Secondary | ICD-10-CM | POA: Diagnosis not present

## 2024-04-29 DIAGNOSIS — I129 Hypertensive chronic kidney disease with stage 1 through stage 4 chronic kidney disease, or unspecified chronic kidney disease: Secondary | ICD-10-CM | POA: Diagnosis not present

## 2024-04-29 DIAGNOSIS — I2510 Atherosclerotic heart disease of native coronary artery without angina pectoris: Secondary | ICD-10-CM | POA: Diagnosis not present

## 2024-04-29 DIAGNOSIS — N179 Acute kidney failure, unspecified: Secondary | ICD-10-CM | POA: Diagnosis not present

## 2024-04-29 DIAGNOSIS — E875 Hyperkalemia: Secondary | ICD-10-CM | POA: Diagnosis not present

## 2024-04-29 DIAGNOSIS — D649 Anemia, unspecified: Secondary | ICD-10-CM | POA: Insufficient documentation

## 2024-04-29 DIAGNOSIS — R799 Abnormal finding of blood chemistry, unspecified: Secondary | ICD-10-CM | POA: Insufficient documentation

## 2024-04-29 DIAGNOSIS — N281 Cyst of kidney, acquired: Secondary | ICD-10-CM | POA: Diagnosis not present

## 2024-04-29 DIAGNOSIS — N189 Chronic kidney disease, unspecified: Secondary | ICD-10-CM | POA: Diagnosis not present

## 2024-04-29 DIAGNOSIS — R5383 Other fatigue: Secondary | ICD-10-CM | POA: Diagnosis not present

## 2024-04-29 LAB — URINALYSIS, W/ REFLEX TO CULTURE (INFECTION SUSPECTED)
Bilirubin Urine: NEGATIVE
Glucose, UA: NEGATIVE mg/dL
Hgb urine dipstick: NEGATIVE
Ketones, ur: NEGATIVE mg/dL
Leukocytes,Ua: NEGATIVE
Nitrite: NEGATIVE
Protein, ur: 100 mg/dL — AB
Specific Gravity, Urine: 1.006 (ref 1.005–1.030)
pH: 8 (ref 5.0–8.0)

## 2024-04-29 LAB — COMPREHENSIVE METABOLIC PANEL WITH GFR
ALT: 22 U/L (ref 0–44)
AST: 17 U/L (ref 15–41)
Albumin: 3.5 g/dL (ref 3.5–5.0)
Alkaline Phosphatase: 62 U/L (ref 38–126)
Anion gap: 8 (ref 5–15)
BUN: 48 mg/dL — ABNORMAL HIGH (ref 8–23)
CO2: 20 mmol/L — ABNORMAL LOW (ref 22–32)
Calcium: 9.2 mg/dL (ref 8.9–10.3)
Chloride: 113 mmol/L — ABNORMAL HIGH (ref 98–111)
Creatinine, Ser: 3 mg/dL — ABNORMAL HIGH (ref 0.44–1.00)
GFR, Estimated: 15 mL/min — ABNORMAL LOW (ref >60)
Glucose, Bld: 103 mg/dL — ABNORMAL HIGH (ref 70–99)
Potassium: 5.7 mmol/L — ABNORMAL HIGH (ref 3.5–5.1)
Sodium: 141 mmol/L (ref 135–145)
Total Bilirubin: 0.4 mg/dL (ref 0.0–1.2)
Total Protein: 6.4 g/dL — ABNORMAL LOW (ref 6.5–8.1)

## 2024-04-29 LAB — PROTIME-INR
INR: 1.1 (ref 0.8–1.2)
Prothrombin Time: 14.8 s (ref 11.4–15.2)

## 2024-04-29 LAB — CBC WITH DIFFERENTIAL/PLATELET
Abs Immature Granulocytes: 0.02 10*3/uL (ref 0.00–0.07)
Basophils Absolute: 0.1 10*3/uL (ref 0.0–0.1)
Basophils Relative: 1 %
Eosinophils Absolute: 0.1 10*3/uL (ref 0.0–0.5)
Eosinophils Relative: 2 %
HCT: 25.4 % — ABNORMAL LOW (ref 36.0–46.0)
Hemoglobin: 7.2 g/dL — ABNORMAL LOW (ref 12.0–15.0)
Immature Granulocytes: 0 %
Lymphocytes Relative: 13 %
Lymphs Abs: 0.7 10*3/uL (ref 0.7–4.0)
MCH: 22.7 pg — ABNORMAL LOW (ref 26.0–34.0)
MCHC: 28.3 g/dL — ABNORMAL LOW (ref 30.0–36.0)
MCV: 80.1 fL (ref 80.0–100.0)
Monocytes Absolute: 0.7 10*3/uL (ref 0.1–1.0)
Monocytes Relative: 12 %
Neutro Abs: 4.1 10*3/uL (ref 1.7–7.7)
Neutrophils Relative %: 72 %
Platelets: 329 10*3/uL (ref 150–400)
RBC: 3.17 MIL/uL — ABNORMAL LOW (ref 3.87–5.11)
RDW: 18.8 % — ABNORMAL HIGH (ref 11.5–15.5)
WBC: 5.6 10*3/uL (ref 4.0–10.5)
nRBC: 0 % (ref 0.0–0.2)

## 2024-04-29 LAB — TYPE AND SCREEN
ABO/RH(D): A POS
Antibody Screen: NEGATIVE

## 2024-04-29 MED ORDER — SODIUM BICARBONATE 8.4 % IV SOLN
50.0000 meq | Freq: Once | INTRAVENOUS | Status: AC
  Administered 2024-04-29: 50 meq via INTRAVENOUS
  Filled 2024-04-29: qty 50

## 2024-04-29 MED ORDER — SODIUM CHLORIDE 0.9 % IV BOLUS
500.0000 mL | Freq: Once | INTRAVENOUS | Status: AC
  Administered 2024-04-29: 500 mL via INTRAVENOUS

## 2024-04-29 MED ORDER — HYDRALAZINE HCL 20 MG/ML IJ SOLN
5.0000 mg | Freq: Once | INTRAMUSCULAR | Status: DC
  Filled 2024-04-29: qty 1

## 2024-04-29 MED ORDER — LOKELMA 10 G PO PACK: 10.0000 g | PACK | Freq: Every day | ORAL | 0 refills | Status: AC

## 2024-04-29 MED ORDER — SODIUM ZIRCONIUM CYCLOSILICATE 10 G PO PACK
10.0000 g | PACK | Freq: Once | ORAL | Status: AC
  Administered 2024-04-29: 10 g via ORAL
  Filled 2024-04-29: qty 1

## 2024-04-29 NOTE — ED Provider Triage Note (Signed)
 Emergency Medicine Provider Triage Evaluation Note  Megan Stevenson , a 83 y.o. female  was evaluated in triage.  Pt complains of abnormal labs. Reports that her PCP told her to come here for blood transfusion. She denies history of this previously. Has felt weak and lightheaded recently. No known hematochezia or melena. No abdominal pain. She is unsure what her labs values actually were  Review of Systems  Positive:  Negative:   Physical Exam  BP (!) 142/64   Pulse 67   Temp 98.9 F (37.2 C)   Resp 16   SpO2 100%  Gen:   Awake, no distress   Resp:  Normal effort  MSK:   Moves extremities without difficulty  Other:    Medical Decision Making  Medically screening exam initiated at 4:20 PM.  Appropriate orders placed.  CAIDYNCE MUZYKA was informed that the remainder of the evaluation will be completed by another provider, this initial triage assessment does not replace that evaluation, and the importance of remaining in the ED until their evaluation is complete.     Sherra Dk, PA-C 04/29/24 1622

## 2024-04-29 NOTE — ED Triage Notes (Signed)
 Pt sent here by PCP for abnormal labs. PCP states her kidney function is elevated and her hgb is low. Pt lives at home and has an aide that sits with her. Pt alert, c.o generalized fatigue.

## 2024-04-29 NOTE — ED Notes (Signed)
 Patient transported to Ultrasound

## 2024-04-29 NOTE — ED Provider Notes (Signed)
 [facility] DEPART[facility] AT [facility] Provider Note   CSN: [id number] Arrival date & time: 04/29/24  1419     History  Chief Complaint  Patient presents with   Abnormal Lab    Megan Stevenson is a 83 y.o. female history of CAD, CKD, here presenting with abnormal labs.  Patient just switched from family practice to another practice.  Per the granddaughter, her new doctor comes to the house to examine her and also get labs from home.  Patient had a lab drawn yesterday and was noted to be anemic and also has acute renal failure per they are unable to compare to previous labs.  Patient was called to come to the ER for further evaluation.  Patient does follow with [facility] and is not on dialysis.  Patient does not know the result of the labs and states that she has been chronically fatigued and tired but denies any fever or vomiting or abdominal pain  The history is provided by the patient.       Home Medications Prior to Admission medications   Medication Sig Start Date End Date Taking? Authorizing Provider  amLODipine  (NORVASC ) 10 MG tablet TAKE 1 TABLET(10 MG) BY MOUTH DAILY 02/25/24   Goble Last, MD  atorvastatin  (LIPITOR) 10 MG tablet TAKE 1 TABLET(10 MG) BY MOUTH AT BEDTIME 02/03/23   Edsel Grace, MD  diclofenac  Sodium (VOLTAREN ) 1 % GEL Apply to right knee 2-3 times daily as needed for pain 08/07/21   Edsel Grace, MD  donepezil  (ARICEPT ) 10 MG tablet TAKE 1 TABLET(10 MG) BY MOUTH DAILY 10/22/23   Goble Last, MD  escitalopram  (LEXAPRO ) 5 MG tablet Take 1 tablet (5 mg total) by mouth daily. 10/27/23   Goble Last, MD  hydrALAZINE  (APRESOLINE ) 25 MG tablet Take 1 tablet (25 mg total) by mouth 3 (three) times daily. 08/01/23   Shitarev, Dimitry, MD  Nutritional Supplements (ENSURE ACTIVE HIGH PROTEIN) LIQD Take 1 Box by mouth 2 (two) times daily. 06/13/23   Goble Last, MD  QUEtiapine  (SEROQUEL ) 25 MG tablet Take 1 tablet (25 mg total) by  mouth at bedtime. 10/27/23   Goble Last, MD      Allergies    Nsaids, Lasix [furosemide], Lotensin [benazepril], and Tenormin [atenolol]    Review of Systems   Review of Systems  Constitutional:  Positive for fatigue.  All other systems reviewed and are negative.   Physical Exam Updated Vital Signs BP (!) 178/64   Pulse 63   Temp 98.3 F (36.8 C) (Oral)   Resp 15   SpO2 100%  Physical Exam Vitals and nursing note reviewed.  Constitutional:      Appearance: Normal appearance.     Comments: Chronically ill but not acutely ill  HENT:     Head: Normocephalic.     Nose: Nose normal.     Mouth/Throat:     Mouth: Mucous membranes are dry.  Eyes:     Extraocular Movements: Extraocular movements intact.     Pupils: Pupils are equal, round, and reactive to light.  Cardiovascular:     Rate and Rhythm: Normal rate and regular rhythm.     Pulses: Normal pulses.     Heart sounds: Normal heart sounds.  Pulmonary:     Effort: Pulmonary effort is normal.     Breath sounds: Normal breath sounds.  Abdominal:     General: Abdomen is flat.     Palpations: Abdomen is soft.  Musculoskeletal:  General: Normal range of motion.     Cervical back: Normal range of motion and neck supple.  Skin:    General: Skin is warm.     Capillary Refill: Capillary refill takes less than 2 seconds.  Neurological:     General: No focal deficit present.     Mental Status: She is alert and oriented to person, place, and time.  Psychiatric:        Mood and Affect: Mood normal.        Behavior: Behavior normal.     ED Results / Procedures / Treatments   Labs (all labs ordered are listed, but only abnormal results are displayed) Labs Reviewed  COMPREHENSIVE METABOLIC PANEL WITH GFR - Abnormal; Notable for the following components:      Result Value   Potassium 5.7 (*)    Chloride 113 (*)    CO2 20 (*)    Glucose, Bld 103 (*)    BUN 48 (*)    Creatinine, Ser 3.00 (*)    Total Protein  6.4 (*)    GFR, Estimated 15 (*)    All other components within normal limits  CBC WITH DIFFERENTIAL/PLATELET - Abnormal; Notable for the following components:   RBC 3.17 (*)    Hemoglobin 7.2 (*)    HCT 25.4 (*)    MCH 22.7 (*)    MCHC 28.3 (*)    RDW 18.8 (*)    All other components within normal limits  PROTIME-INR  URINALYSIS, W/ REFLEX TO CULTURE (INFECTION SUSPECTED)  TYPE AND SCREEN    EKG EKG Interpretation Date/Time:  Thursday Apr 29 2024 17:53:06 EDT Ventricular Rate:  62 PR Interval:  156 QRS Duration:  104 QT Interval:  436 QTC Calculation: 443 R Axis:   4  Text Interpretation: Sinus rhythm No significant change since last tracing Confirmed by Florette Hurry ([id number]) on 04/29/2024 6:17:33 PM  Radiology DG Chest Port 1 View Result Date: 04/29/2024 CLINICAL DATA:  Generalized fatigue EXAM: PORTABLE CHEST 1 VIEW COMPARISON:  Chest radiograph dated 09/10/2023 FINDINGS: Normal lung volumes. No focal consolidations. No pleural effusion or pneumothorax. Similar enlarged cardiomediastinal silhouette. No acute osseous abnormality. IMPRESSION: 1.  No focal consolidations. 2. Similar cardiomegaly. Electronically Signed   By: Limin  Xu M.D.   On: 04/29/2024 19:35    Procedures Procedures    Medications Ordered in ED Medications  sodium chloride  0.9 % bolus 500 mL (0 mLs Intravenous Stopped 04/29/24 1938)  sodium zirconium cyclosilicate (LOKELMA) packet 10 g (10 g Oral Given 04/29/24 1829)  sodium bicarbonate injection 50 mEq (50 mEq Intravenous Given 04/29/24 1829)    ED Course/ Medical Decision Making/ A&P                                 Medical Decision Making Megan Stevenson is a 83 y.o. female who presented with worsening renal function and anemia.  Patient denies any bleeding and has been fatigued chronically and has no acute worsening symptoms.  Plan to recheck CBC and CMP.  7 pm CMP showed creatinine of 3 and baseline is 2.6-3.  Patient's potassium is elevated at  5.7.  However patient does not have any peaked T waves.  I discussed with Dr. Cindra Cree from nephrology.  He recommends Lokelma 10 mg daily for 3 days and follow-up in the clinic to repeat potassium level.  He does not think patient needs admission.  Of note, patient's  CBC showed hemoglobin of 7.5 which is baseline for her.  10:26 PM Patient's US  showed bilateral echogenic kidneys and no hydro. UA showed no UTI. 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 Decision-making details documented in ED Course.  Risk Prescription drug management.   Final Clinical Impression(s) / ED Diagnoses Final diagnoses:  None    Rx / DC Orders ED Discharge Orders     None         Dalene Duck, MD 04/29/24 2228

## 2024-04-29 NOTE — Discharge Instructions (Addendum)
 As we discussed, you have chronic kidney disease and also chronic anemia that is not significantly changed.  The only difference is that your potassium is slightly elevated.  You were given some medicines in the ER for elevated potassium I prescribed Lokelma 10 g daily for 2 days  You need to have your potassium rechecked with your doctor next week  Please have close follow-up with your doctor and also with your kidney doctor  Return to ER if you have worse fatigue or chest pain or vomiting or fever

## 2024-04-29 NOTE — ED Notes (Signed)
 Awaiting patient from lobby.

## 2024-04-29 NOTE — ED Notes (Signed)
 Patient returned to room from CT. Connected to monitor. VSS

## 2024-05-04 DIAGNOSIS — N184 Chronic kidney disease, stage 4 (severe): Secondary | ICD-10-CM | POA: Diagnosis not present

## 2024-05-04 DIAGNOSIS — D649 Anemia, unspecified: Secondary | ICD-10-CM | POA: Diagnosis not present

## 2024-05-04 DIAGNOSIS — E875 Hyperkalemia: Secondary | ICD-10-CM | POA: Diagnosis not present

## 2024-05-04 DIAGNOSIS — I129 Hypertensive chronic kidney disease with stage 1 through stage 4 chronic kidney disease, or unspecified chronic kidney disease: Secondary | ICD-10-CM | POA: Diagnosis not present

## 2024-05-06 DIAGNOSIS — M170 Bilateral primary osteoarthritis of knee: Secondary | ICD-10-CM | POA: Diagnosis not present

## 2024-05-06 DIAGNOSIS — I120 Hypertensive chronic kidney disease with stage 5 chronic kidney disease or end stage renal disease: Secondary | ICD-10-CM | POA: Diagnosis not present

## 2024-05-06 DIAGNOSIS — F0390 Unspecified dementia without behavioral disturbance: Secondary | ICD-10-CM | POA: Diagnosis not present

## 2024-05-06 DIAGNOSIS — F339 Major depressive disorder, recurrent, unspecified: Secondary | ICD-10-CM | POA: Diagnosis not present

## 2024-05-06 DIAGNOSIS — D509 Iron deficiency anemia, unspecified: Secondary | ICD-10-CM | POA: Diagnosis not present

## 2024-05-06 DIAGNOSIS — R54 Age-related physical debility: Secondary | ICD-10-CM | POA: Diagnosis not present

## 2024-05-06 DIAGNOSIS — E785 Hyperlipidemia, unspecified: Secondary | ICD-10-CM | POA: Diagnosis not present

## 2024-05-06 DIAGNOSIS — N186 End stage renal disease: Secondary | ICD-10-CM | POA: Diagnosis not present

## 2024-05-11 DIAGNOSIS — D649 Anemia, unspecified: Secondary | ICD-10-CM | POA: Diagnosis not present

## 2024-05-11 DIAGNOSIS — I10 Essential (primary) hypertension: Secondary | ICD-10-CM | POA: Diagnosis not present

## 2024-05-11 DIAGNOSIS — E875 Hyperkalemia: Secondary | ICD-10-CM | POA: Diagnosis not present

## 2024-05-31 DIAGNOSIS — Z1331 Encounter for screening for depression: Secondary | ICD-10-CM | POA: Diagnosis not present

## 2024-05-31 DIAGNOSIS — Z0000 Encounter for general adult medical examination without abnormal findings: Secondary | ICD-10-CM | POA: Diagnosis not present

## 2024-05-31 DIAGNOSIS — D631 Anemia in chronic kidney disease: Secondary | ICD-10-CM | POA: Diagnosis not present

## 2024-05-31 DIAGNOSIS — E1122 Type 2 diabetes mellitus with diabetic chronic kidney disease: Secondary | ICD-10-CM | POA: Diagnosis not present

## 2024-05-31 DIAGNOSIS — E785 Hyperlipidemia, unspecified: Secondary | ICD-10-CM | POA: Diagnosis not present

## 2024-05-31 DIAGNOSIS — I129 Hypertensive chronic kidney disease with stage 1 through stage 4 chronic kidney disease, or unspecified chronic kidney disease: Secondary | ICD-10-CM | POA: Diagnosis not present

## 2024-05-31 DIAGNOSIS — N184 Chronic kidney disease, stage 4 (severe): Secondary | ICD-10-CM | POA: Diagnosis not present

## 2024-06-02 DIAGNOSIS — N184 Chronic kidney disease, stage 4 (severe): Secondary | ICD-10-CM | POA: Diagnosis not present

## 2024-06-02 DIAGNOSIS — I129 Hypertensive chronic kidney disease with stage 1 through stage 4 chronic kidney disease, or unspecified chronic kidney disease: Secondary | ICD-10-CM | POA: Diagnosis not present

## 2024-06-02 DIAGNOSIS — G4720 Circadian rhythm sleep disorder, unspecified type: Secondary | ICD-10-CM | POA: Diagnosis not present

## 2024-06-02 DIAGNOSIS — F0390 Unspecified dementia without behavioral disturbance: Secondary | ICD-10-CM | POA: Diagnosis not present

## 2024-06-03 DIAGNOSIS — R54 Age-related physical debility: Secondary | ICD-10-CM | POA: Diagnosis not present

## 2024-06-03 DIAGNOSIS — N186 End stage renal disease: Secondary | ICD-10-CM | POA: Diagnosis not present

## 2024-06-03 DIAGNOSIS — E785 Hyperlipidemia, unspecified: Secondary | ICD-10-CM | POA: Diagnosis not present

## 2024-06-03 DIAGNOSIS — I120 Hypertensive chronic kidney disease with stage 5 chronic kidney disease or end stage renal disease: Secondary | ICD-10-CM | POA: Diagnosis not present

## 2024-06-03 DIAGNOSIS — M170 Bilateral primary osteoarthritis of knee: Secondary | ICD-10-CM | POA: Diagnosis not present

## 2024-06-03 DIAGNOSIS — F339 Major depressive disorder, recurrent, unspecified: Secondary | ICD-10-CM | POA: Diagnosis not present

## 2024-06-03 DIAGNOSIS — D509 Iron deficiency anemia, unspecified: Secondary | ICD-10-CM | POA: Diagnosis not present

## 2024-06-03 DIAGNOSIS — F0390 Unspecified dementia without behavioral disturbance: Secondary | ICD-10-CM | POA: Diagnosis not present

## 2024-06-03 DIAGNOSIS — D649 Anemia, unspecified: Secondary | ICD-10-CM | POA: Diagnosis not present

## 2024-06-09 DIAGNOSIS — D649 Anemia, unspecified: Secondary | ICD-10-CM | POA: Diagnosis not present

## 2024-06-09 DIAGNOSIS — G311 Senile degeneration of brain, not elsewhere classified: Secondary | ICD-10-CM | POA: Diagnosis not present

## 2024-06-09 DIAGNOSIS — M170 Bilateral primary osteoarthritis of knee: Secondary | ICD-10-CM | POA: Diagnosis not present

## 2024-06-09 DIAGNOSIS — I129 Hypertensive chronic kidney disease with stage 1 through stage 4 chronic kidney disease, or unspecified chronic kidney disease: Secondary | ICD-10-CM | POA: Diagnosis not present

## 2024-06-09 DIAGNOSIS — F02811 Dementia in other diseases classified elsewhere, unspecified severity, with agitation: Secondary | ICD-10-CM | POA: Diagnosis not present

## 2024-06-09 DIAGNOSIS — N184 Chronic kidney disease, stage 4 (severe): Secondary | ICD-10-CM | POA: Diagnosis not present

## 2024-06-09 DIAGNOSIS — R001 Bradycardia, unspecified: Secondary | ICD-10-CM | POA: Diagnosis not present

## 2024-06-09 DIAGNOSIS — E1122 Type 2 diabetes mellitus with diabetic chronic kidney disease: Secondary | ICD-10-CM | POA: Diagnosis not present

## 2024-06-09 DIAGNOSIS — E875 Hyperkalemia: Secondary | ICD-10-CM | POA: Diagnosis not present

## 2024-06-09 DIAGNOSIS — M109 Gout, unspecified: Secondary | ICD-10-CM | POA: Diagnosis not present

## 2024-06-28 DIAGNOSIS — F339 Major depressive disorder, recurrent, unspecified: Secondary | ICD-10-CM | POA: Diagnosis not present

## 2024-06-28 DIAGNOSIS — D649 Anemia, unspecified: Secondary | ICD-10-CM | POA: Diagnosis not present

## 2024-06-28 DIAGNOSIS — M170 Bilateral primary osteoarthritis of knee: Secondary | ICD-10-CM | POA: Diagnosis not present

## 2024-06-28 DIAGNOSIS — R54 Age-related physical debility: Secondary | ICD-10-CM | POA: Diagnosis not present

## 2024-06-28 DIAGNOSIS — D509 Iron deficiency anemia, unspecified: Secondary | ICD-10-CM | POA: Diagnosis not present

## 2024-06-28 DIAGNOSIS — E785 Hyperlipidemia, unspecified: Secondary | ICD-10-CM | POA: Diagnosis not present

## 2024-06-28 DIAGNOSIS — F0390 Unspecified dementia without behavioral disturbance: Secondary | ICD-10-CM | POA: Diagnosis not present

## 2024-06-28 DIAGNOSIS — N186 End stage renal disease: Secondary | ICD-10-CM | POA: Diagnosis not present

## 2024-06-28 DIAGNOSIS — I120 Hypertensive chronic kidney disease with stage 5 chronic kidney disease or end stage renal disease: Secondary | ICD-10-CM | POA: Diagnosis not present

## 2024-07-07 DIAGNOSIS — I10 Essential (primary) hypertension: Secondary | ICD-10-CM | POA: Diagnosis not present

## 2024-07-07 DIAGNOSIS — Z7409 Other reduced mobility: Secondary | ICD-10-CM | POA: Diagnosis not present

## 2024-07-07 DIAGNOSIS — H6123 Impacted cerumen, bilateral: Secondary | ICD-10-CM | POA: Diagnosis not present

## 2024-07-09 DIAGNOSIS — M170 Bilateral primary osteoarthritis of knee: Secondary | ICD-10-CM | POA: Diagnosis not present

## 2024-07-09 DIAGNOSIS — E1122 Type 2 diabetes mellitus with diabetic chronic kidney disease: Secondary | ICD-10-CM | POA: Diagnosis not present

## 2024-07-09 DIAGNOSIS — M109 Gout, unspecified: Secondary | ICD-10-CM | POA: Diagnosis not present

## 2024-07-09 DIAGNOSIS — R001 Bradycardia, unspecified: Secondary | ICD-10-CM | POA: Diagnosis not present

## 2024-07-09 DIAGNOSIS — D649 Anemia, unspecified: Secondary | ICD-10-CM | POA: Diagnosis not present

## 2024-07-09 DIAGNOSIS — E875 Hyperkalemia: Secondary | ICD-10-CM | POA: Diagnosis not present

## 2024-07-09 DIAGNOSIS — G311 Senile degeneration of brain, not elsewhere classified: Secondary | ICD-10-CM | POA: Diagnosis not present

## 2024-07-09 DIAGNOSIS — I129 Hypertensive chronic kidney disease with stage 1 through stage 4 chronic kidney disease, or unspecified chronic kidney disease: Secondary | ICD-10-CM | POA: Diagnosis not present

## 2024-07-09 DIAGNOSIS — F02811 Dementia in other diseases classified elsewhere, unspecified severity, with agitation: Secondary | ICD-10-CM | POA: Diagnosis not present

## 2024-07-09 DIAGNOSIS — N184 Chronic kidney disease, stage 4 (severe): Secondary | ICD-10-CM | POA: Diagnosis not present

## 2024-07-16 DIAGNOSIS — I129 Hypertensive chronic kidney disease with stage 1 through stage 4 chronic kidney disease, or unspecified chronic kidney disease: Secondary | ICD-10-CM | POA: Diagnosis not present

## 2024-07-16 DIAGNOSIS — D649 Anemia, unspecified: Secondary | ICD-10-CM | POA: Diagnosis not present

## 2024-07-16 DIAGNOSIS — N184 Chronic kidney disease, stage 4 (severe): Secondary | ICD-10-CM | POA: Diagnosis not present

## 2024-07-16 DIAGNOSIS — E875 Hyperkalemia: Secondary | ICD-10-CM | POA: Diagnosis not present

## 2024-07-16 DIAGNOSIS — G311 Senile degeneration of brain, not elsewhere classified: Secondary | ICD-10-CM | POA: Diagnosis not present

## 2024-07-16 DIAGNOSIS — E1122 Type 2 diabetes mellitus with diabetic chronic kidney disease: Secondary | ICD-10-CM | POA: Diagnosis not present

## 2024-07-22 DIAGNOSIS — G311 Senile degeneration of brain, not elsewhere classified: Secondary | ICD-10-CM | POA: Diagnosis not present

## 2024-07-22 DIAGNOSIS — I129 Hypertensive chronic kidney disease with stage 1 through stage 4 chronic kidney disease, or unspecified chronic kidney disease: Secondary | ICD-10-CM | POA: Diagnosis not present

## 2024-07-22 DIAGNOSIS — E875 Hyperkalemia: Secondary | ICD-10-CM | POA: Diagnosis not present

## 2024-07-22 DIAGNOSIS — N184 Chronic kidney disease, stage 4 (severe): Secondary | ICD-10-CM | POA: Diagnosis not present

## 2024-07-22 DIAGNOSIS — E1122 Type 2 diabetes mellitus with diabetic chronic kidney disease: Secondary | ICD-10-CM | POA: Diagnosis not present

## 2024-07-22 DIAGNOSIS — D649 Anemia, unspecified: Secondary | ICD-10-CM | POA: Diagnosis not present

## 2024-07-23 DIAGNOSIS — E1122 Type 2 diabetes mellitus with diabetic chronic kidney disease: Secondary | ICD-10-CM | POA: Diagnosis not present

## 2024-07-23 DIAGNOSIS — G311 Senile degeneration of brain, not elsewhere classified: Secondary | ICD-10-CM | POA: Diagnosis not present

## 2024-07-23 DIAGNOSIS — D649 Anemia, unspecified: Secondary | ICD-10-CM | POA: Diagnosis not present

## 2024-07-23 DIAGNOSIS — I129 Hypertensive chronic kidney disease with stage 1 through stage 4 chronic kidney disease, or unspecified chronic kidney disease: Secondary | ICD-10-CM | POA: Diagnosis not present

## 2024-07-23 DIAGNOSIS — N184 Chronic kidney disease, stage 4 (severe): Secondary | ICD-10-CM | POA: Diagnosis not present

## 2024-07-23 DIAGNOSIS — E875 Hyperkalemia: Secondary | ICD-10-CM | POA: Diagnosis not present

## 2024-07-30 DIAGNOSIS — G311 Senile degeneration of brain, not elsewhere classified: Secondary | ICD-10-CM | POA: Diagnosis not present

## 2024-07-30 DIAGNOSIS — E1122 Type 2 diabetes mellitus with diabetic chronic kidney disease: Secondary | ICD-10-CM | POA: Diagnosis not present

## 2024-07-30 DIAGNOSIS — D649 Anemia, unspecified: Secondary | ICD-10-CM | POA: Diagnosis not present

## 2024-07-30 DIAGNOSIS — I129 Hypertensive chronic kidney disease with stage 1 through stage 4 chronic kidney disease, or unspecified chronic kidney disease: Secondary | ICD-10-CM | POA: Diagnosis not present

## 2024-07-30 DIAGNOSIS — E875 Hyperkalemia: Secondary | ICD-10-CM | POA: Diagnosis not present

## 2024-07-30 DIAGNOSIS — N184 Chronic kidney disease, stage 4 (severe): Secondary | ICD-10-CM | POA: Diagnosis not present

## 2024-08-06 DIAGNOSIS — G311 Senile degeneration of brain, not elsewhere classified: Secondary | ICD-10-CM | POA: Diagnosis not present

## 2024-08-06 DIAGNOSIS — E1122 Type 2 diabetes mellitus with diabetic chronic kidney disease: Secondary | ICD-10-CM | POA: Diagnosis not present

## 2024-08-06 DIAGNOSIS — N184 Chronic kidney disease, stage 4 (severe): Secondary | ICD-10-CM | POA: Diagnosis not present

## 2024-08-06 DIAGNOSIS — D649 Anemia, unspecified: Secondary | ICD-10-CM | POA: Diagnosis not present

## 2024-08-06 DIAGNOSIS — E875 Hyperkalemia: Secondary | ICD-10-CM | POA: Diagnosis not present

## 2024-08-06 DIAGNOSIS — I129 Hypertensive chronic kidney disease with stage 1 through stage 4 chronic kidney disease, or unspecified chronic kidney disease: Secondary | ICD-10-CM | POA: Diagnosis not present

## 2024-08-09 DIAGNOSIS — F02811 Dementia in other diseases classified elsewhere, unspecified severity, with agitation: Secondary | ICD-10-CM | POA: Diagnosis not present

## 2024-08-09 DIAGNOSIS — E875 Hyperkalemia: Secondary | ICD-10-CM | POA: Diagnosis not present

## 2024-08-09 DIAGNOSIS — G311 Senile degeneration of brain, not elsewhere classified: Secondary | ICD-10-CM | POA: Diagnosis not present

## 2024-08-09 DIAGNOSIS — M109 Gout, unspecified: Secondary | ICD-10-CM | POA: Diagnosis not present

## 2024-08-09 DIAGNOSIS — N184 Chronic kidney disease, stage 4 (severe): Secondary | ICD-10-CM | POA: Diagnosis not present

## 2024-08-09 DIAGNOSIS — E1122 Type 2 diabetes mellitus with diabetic chronic kidney disease: Secondary | ICD-10-CM | POA: Diagnosis not present

## 2024-08-09 DIAGNOSIS — I129 Hypertensive chronic kidney disease with stage 1 through stage 4 chronic kidney disease, or unspecified chronic kidney disease: Secondary | ICD-10-CM | POA: Diagnosis not present

## 2024-08-09 DIAGNOSIS — D649 Anemia, unspecified: Secondary | ICD-10-CM | POA: Diagnosis not present

## 2024-08-09 DIAGNOSIS — R001 Bradycardia, unspecified: Secondary | ICD-10-CM | POA: Diagnosis not present

## 2024-08-09 DIAGNOSIS — M170 Bilateral primary osteoarthritis of knee: Secondary | ICD-10-CM | POA: Diagnosis not present

## 2024-08-10 DIAGNOSIS — I129 Hypertensive chronic kidney disease with stage 1 through stage 4 chronic kidney disease, or unspecified chronic kidney disease: Secondary | ICD-10-CM | POA: Diagnosis not present

## 2024-08-10 DIAGNOSIS — E875 Hyperkalemia: Secondary | ICD-10-CM | POA: Diagnosis not present

## 2024-08-10 DIAGNOSIS — N184 Chronic kidney disease, stage 4 (severe): Secondary | ICD-10-CM | POA: Diagnosis not present

## 2024-08-10 DIAGNOSIS — E1122 Type 2 diabetes mellitus with diabetic chronic kidney disease: Secondary | ICD-10-CM | POA: Diagnosis not present

## 2024-08-10 DIAGNOSIS — D649 Anemia, unspecified: Secondary | ICD-10-CM | POA: Diagnosis not present

## 2024-08-10 DIAGNOSIS — G311 Senile degeneration of brain, not elsewhere classified: Secondary | ICD-10-CM | POA: Diagnosis not present

## 2024-08-13 DIAGNOSIS — G311 Senile degeneration of brain, not elsewhere classified: Secondary | ICD-10-CM | POA: Diagnosis not present

## 2024-08-13 DIAGNOSIS — E875 Hyperkalemia: Secondary | ICD-10-CM | POA: Diagnosis not present

## 2024-08-13 DIAGNOSIS — D649 Anemia, unspecified: Secondary | ICD-10-CM | POA: Diagnosis not present

## 2024-08-13 DIAGNOSIS — E1122 Type 2 diabetes mellitus with diabetic chronic kidney disease: Secondary | ICD-10-CM | POA: Diagnosis not present

## 2024-08-13 DIAGNOSIS — N184 Chronic kidney disease, stage 4 (severe): Secondary | ICD-10-CM | POA: Diagnosis not present

## 2024-08-13 DIAGNOSIS — I129 Hypertensive chronic kidney disease with stage 1 through stage 4 chronic kidney disease, or unspecified chronic kidney disease: Secondary | ICD-10-CM | POA: Diagnosis not present

## 2024-08-18 DIAGNOSIS — E1122 Type 2 diabetes mellitus with diabetic chronic kidney disease: Secondary | ICD-10-CM | POA: Diagnosis not present

## 2024-08-18 DIAGNOSIS — E875 Hyperkalemia: Secondary | ICD-10-CM | POA: Diagnosis not present

## 2024-08-18 DIAGNOSIS — I129 Hypertensive chronic kidney disease with stage 1 through stage 4 chronic kidney disease, or unspecified chronic kidney disease: Secondary | ICD-10-CM | POA: Diagnosis not present

## 2024-08-18 DIAGNOSIS — D649 Anemia, unspecified: Secondary | ICD-10-CM | POA: Diagnosis not present

## 2024-08-18 DIAGNOSIS — N184 Chronic kidney disease, stage 4 (severe): Secondary | ICD-10-CM | POA: Diagnosis not present

## 2024-08-18 DIAGNOSIS — G311 Senile degeneration of brain, not elsewhere classified: Secondary | ICD-10-CM | POA: Diagnosis not present

## 2024-08-27 DIAGNOSIS — G311 Senile degeneration of brain, not elsewhere classified: Secondary | ICD-10-CM | POA: Diagnosis not present

## 2024-08-27 DIAGNOSIS — E1122 Type 2 diabetes mellitus with diabetic chronic kidney disease: Secondary | ICD-10-CM | POA: Diagnosis not present

## 2024-08-27 DIAGNOSIS — D649 Anemia, unspecified: Secondary | ICD-10-CM | POA: Diagnosis not present

## 2024-08-27 DIAGNOSIS — I129 Hypertensive chronic kidney disease with stage 1 through stage 4 chronic kidney disease, or unspecified chronic kidney disease: Secondary | ICD-10-CM | POA: Diagnosis not present

## 2024-08-27 DIAGNOSIS — E875 Hyperkalemia: Secondary | ICD-10-CM | POA: Diagnosis not present

## 2024-08-27 DIAGNOSIS — N184 Chronic kidney disease, stage 4 (severe): Secondary | ICD-10-CM | POA: Diagnosis not present

## 2024-09-03 DIAGNOSIS — I129 Hypertensive chronic kidney disease with stage 1 through stage 4 chronic kidney disease, or unspecified chronic kidney disease: Secondary | ICD-10-CM | POA: Diagnosis not present

## 2024-09-03 DIAGNOSIS — N184 Chronic kidney disease, stage 4 (severe): Secondary | ICD-10-CM | POA: Diagnosis not present

## 2024-09-03 DIAGNOSIS — E1122 Type 2 diabetes mellitus with diabetic chronic kidney disease: Secondary | ICD-10-CM | POA: Diagnosis not present

## 2024-09-03 DIAGNOSIS — E875 Hyperkalemia: Secondary | ICD-10-CM | POA: Diagnosis not present

## 2024-09-03 DIAGNOSIS — D649 Anemia, unspecified: Secondary | ICD-10-CM | POA: Diagnosis not present

## 2024-09-03 DIAGNOSIS — G311 Senile degeneration of brain, not elsewhere classified: Secondary | ICD-10-CM | POA: Diagnosis not present

## 2024-09-08 DIAGNOSIS — F02811 Dementia in other diseases classified elsewhere, unspecified severity, with agitation: Secondary | ICD-10-CM | POA: Diagnosis not present

## 2024-09-08 DIAGNOSIS — N184 Chronic kidney disease, stage 4 (severe): Secondary | ICD-10-CM | POA: Diagnosis not present

## 2024-09-08 DIAGNOSIS — M170 Bilateral primary osteoarthritis of knee: Secondary | ICD-10-CM | POA: Diagnosis not present

## 2024-09-08 DIAGNOSIS — I129 Hypertensive chronic kidney disease with stage 1 through stage 4 chronic kidney disease, or unspecified chronic kidney disease: Secondary | ICD-10-CM | POA: Diagnosis not present

## 2024-09-08 DIAGNOSIS — R001 Bradycardia, unspecified: Secondary | ICD-10-CM | POA: Diagnosis not present

## 2024-09-08 DIAGNOSIS — D649 Anemia, unspecified: Secondary | ICD-10-CM | POA: Diagnosis not present

## 2024-09-08 DIAGNOSIS — E875 Hyperkalemia: Secondary | ICD-10-CM | POA: Diagnosis not present

## 2024-09-08 DIAGNOSIS — E1122 Type 2 diabetes mellitus with diabetic chronic kidney disease: Secondary | ICD-10-CM | POA: Diagnosis not present

## 2024-09-08 DIAGNOSIS — G311 Senile degeneration of brain, not elsewhere classified: Secondary | ICD-10-CM | POA: Diagnosis not present

## 2024-09-08 DIAGNOSIS — M109 Gout, unspecified: Secondary | ICD-10-CM | POA: Diagnosis not present

## 2024-09-10 DIAGNOSIS — D649 Anemia, unspecified: Secondary | ICD-10-CM | POA: Diagnosis not present

## 2024-09-10 DIAGNOSIS — E875 Hyperkalemia: Secondary | ICD-10-CM | POA: Diagnosis not present

## 2024-09-10 DIAGNOSIS — E1122 Type 2 diabetes mellitus with diabetic chronic kidney disease: Secondary | ICD-10-CM | POA: Diagnosis not present

## 2024-09-10 DIAGNOSIS — N184 Chronic kidney disease, stage 4 (severe): Secondary | ICD-10-CM | POA: Diagnosis not present

## 2024-09-10 DIAGNOSIS — I129 Hypertensive chronic kidney disease with stage 1 through stage 4 chronic kidney disease, or unspecified chronic kidney disease: Secondary | ICD-10-CM | POA: Diagnosis not present

## 2024-09-10 DIAGNOSIS — G311 Senile degeneration of brain, not elsewhere classified: Secondary | ICD-10-CM | POA: Diagnosis not present

## 2024-09-17 DIAGNOSIS — I129 Hypertensive chronic kidney disease with stage 1 through stage 4 chronic kidney disease, or unspecified chronic kidney disease: Secondary | ICD-10-CM | POA: Diagnosis not present

## 2024-09-17 DIAGNOSIS — G311 Senile degeneration of brain, not elsewhere classified: Secondary | ICD-10-CM | POA: Diagnosis not present

## 2024-09-17 DIAGNOSIS — N184 Chronic kidney disease, stage 4 (severe): Secondary | ICD-10-CM | POA: Diagnosis not present

## 2024-09-17 DIAGNOSIS — D649 Anemia, unspecified: Secondary | ICD-10-CM | POA: Diagnosis not present

## 2024-09-17 DIAGNOSIS — E1122 Type 2 diabetes mellitus with diabetic chronic kidney disease: Secondary | ICD-10-CM | POA: Diagnosis not present

## 2024-09-17 DIAGNOSIS — E875 Hyperkalemia: Secondary | ICD-10-CM | POA: Diagnosis not present

## 2024-09-24 DIAGNOSIS — N184 Chronic kidney disease, stage 4 (severe): Secondary | ICD-10-CM | POA: Diagnosis not present

## 2024-09-24 DIAGNOSIS — G311 Senile degeneration of brain, not elsewhere classified: Secondary | ICD-10-CM | POA: Diagnosis not present

## 2024-09-24 DIAGNOSIS — E1122 Type 2 diabetes mellitus with diabetic chronic kidney disease: Secondary | ICD-10-CM | POA: Diagnosis not present

## 2024-09-24 DIAGNOSIS — I129 Hypertensive chronic kidney disease with stage 1 through stage 4 chronic kidney disease, or unspecified chronic kidney disease: Secondary | ICD-10-CM | POA: Diagnosis not present

## 2024-09-24 DIAGNOSIS — D649 Anemia, unspecified: Secondary | ICD-10-CM | POA: Diagnosis not present

## 2024-09-24 DIAGNOSIS — E875 Hyperkalemia: Secondary | ICD-10-CM | POA: Diagnosis not present

## 2024-09-27 DIAGNOSIS — E875 Hyperkalemia: Secondary | ICD-10-CM | POA: Diagnosis not present

## 2024-09-27 DIAGNOSIS — N184 Chronic kidney disease, stage 4 (severe): Secondary | ICD-10-CM | POA: Diagnosis not present

## 2024-09-27 DIAGNOSIS — I129 Hypertensive chronic kidney disease with stage 1 through stage 4 chronic kidney disease, or unspecified chronic kidney disease: Secondary | ICD-10-CM | POA: Diagnosis not present

## 2024-09-27 DIAGNOSIS — D649 Anemia, unspecified: Secondary | ICD-10-CM | POA: Diagnosis not present

## 2024-09-27 DIAGNOSIS — E1122 Type 2 diabetes mellitus with diabetic chronic kidney disease: Secondary | ICD-10-CM | POA: Diagnosis not present

## 2024-09-27 DIAGNOSIS — G311 Senile degeneration of brain, not elsewhere classified: Secondary | ICD-10-CM | POA: Diagnosis not present

## 2024-09-28 DIAGNOSIS — D649 Anemia, unspecified: Secondary | ICD-10-CM | POA: Diagnosis not present

## 2024-09-28 DIAGNOSIS — I129 Hypertensive chronic kidney disease with stage 1 through stage 4 chronic kidney disease, or unspecified chronic kidney disease: Secondary | ICD-10-CM | POA: Diagnosis not present

## 2024-09-28 DIAGNOSIS — G311 Senile degeneration of brain, not elsewhere classified: Secondary | ICD-10-CM | POA: Diagnosis not present

## 2024-09-28 DIAGNOSIS — E1122 Type 2 diabetes mellitus with diabetic chronic kidney disease: Secondary | ICD-10-CM | POA: Diagnosis not present

## 2024-09-28 DIAGNOSIS — E875 Hyperkalemia: Secondary | ICD-10-CM | POA: Diagnosis not present

## 2024-09-28 DIAGNOSIS — N184 Chronic kidney disease, stage 4 (severe): Secondary | ICD-10-CM | POA: Diagnosis not present

## 2024-09-30 DIAGNOSIS — D649 Anemia, unspecified: Secondary | ICD-10-CM | POA: Diagnosis not present

## 2024-09-30 DIAGNOSIS — I129 Hypertensive chronic kidney disease with stage 1 through stage 4 chronic kidney disease, or unspecified chronic kidney disease: Secondary | ICD-10-CM | POA: Diagnosis not present

## 2024-09-30 DIAGNOSIS — E875 Hyperkalemia: Secondary | ICD-10-CM | POA: Diagnosis not present

## 2024-09-30 DIAGNOSIS — N184 Chronic kidney disease, stage 4 (severe): Secondary | ICD-10-CM | POA: Diagnosis not present

## 2024-09-30 DIAGNOSIS — E1122 Type 2 diabetes mellitus with diabetic chronic kidney disease: Secondary | ICD-10-CM | POA: Diagnosis not present

## 2024-09-30 DIAGNOSIS — G311 Senile degeneration of brain, not elsewhere classified: Secondary | ICD-10-CM | POA: Diagnosis not present

## 2024-10-08 DIAGNOSIS — D649 Anemia, unspecified: Secondary | ICD-10-CM | POA: Diagnosis not present

## 2024-10-08 DIAGNOSIS — I129 Hypertensive chronic kidney disease with stage 1 through stage 4 chronic kidney disease, or unspecified chronic kidney disease: Secondary | ICD-10-CM | POA: Diagnosis not present

## 2024-10-08 DIAGNOSIS — E875 Hyperkalemia: Secondary | ICD-10-CM | POA: Diagnosis not present

## 2024-10-08 DIAGNOSIS — N184 Chronic kidney disease, stage 4 (severe): Secondary | ICD-10-CM | POA: Diagnosis not present

## 2024-10-08 DIAGNOSIS — E1122 Type 2 diabetes mellitus with diabetic chronic kidney disease: Secondary | ICD-10-CM | POA: Diagnosis not present

## 2024-10-08 DIAGNOSIS — G311 Senile degeneration of brain, not elsewhere classified: Secondary | ICD-10-CM | POA: Diagnosis not present

## 2024-10-09 DIAGNOSIS — N184 Chronic kidney disease, stage 4 (severe): Secondary | ICD-10-CM | POA: Diagnosis not present

## 2024-10-09 DIAGNOSIS — D649 Anemia, unspecified: Secondary | ICD-10-CM | POA: Diagnosis not present

## 2024-10-09 DIAGNOSIS — E875 Hyperkalemia: Secondary | ICD-10-CM | POA: Diagnosis not present

## 2024-10-09 DIAGNOSIS — M109 Gout, unspecified: Secondary | ICD-10-CM | POA: Diagnosis not present

## 2024-10-09 DIAGNOSIS — M170 Bilateral primary osteoarthritis of knee: Secondary | ICD-10-CM | POA: Diagnosis not present

## 2024-10-09 DIAGNOSIS — R001 Bradycardia, unspecified: Secondary | ICD-10-CM | POA: Diagnosis not present

## 2024-10-09 DIAGNOSIS — E1122 Type 2 diabetes mellitus with diabetic chronic kidney disease: Secondary | ICD-10-CM | POA: Diagnosis not present

## 2024-10-09 DIAGNOSIS — G311 Senile degeneration of brain, not elsewhere classified: Secondary | ICD-10-CM | POA: Diagnosis not present

## 2024-10-09 DIAGNOSIS — I129 Hypertensive chronic kidney disease with stage 1 through stage 4 chronic kidney disease, or unspecified chronic kidney disease: Secondary | ICD-10-CM | POA: Diagnosis not present

## 2024-10-09 DIAGNOSIS — F02811 Dementia in other diseases classified elsewhere, unspecified severity, with agitation: Secondary | ICD-10-CM | POA: Diagnosis not present

## 2024-10-15 DIAGNOSIS — D649 Anemia, unspecified: Secondary | ICD-10-CM | POA: Diagnosis not present

## 2024-10-15 DIAGNOSIS — E1122 Type 2 diabetes mellitus with diabetic chronic kidney disease: Secondary | ICD-10-CM | POA: Diagnosis not present

## 2024-10-15 DIAGNOSIS — G311 Senile degeneration of brain, not elsewhere classified: Secondary | ICD-10-CM | POA: Diagnosis not present

## 2024-10-15 DIAGNOSIS — I129 Hypertensive chronic kidney disease with stage 1 through stage 4 chronic kidney disease, or unspecified chronic kidney disease: Secondary | ICD-10-CM | POA: Diagnosis not present

## 2024-10-15 DIAGNOSIS — E875 Hyperkalemia: Secondary | ICD-10-CM | POA: Diagnosis not present

## 2024-10-15 DIAGNOSIS — N184 Chronic kidney disease, stage 4 (severe): Secondary | ICD-10-CM | POA: Diagnosis not present

## 2024-10-22 DIAGNOSIS — D649 Anemia, unspecified: Secondary | ICD-10-CM | POA: Diagnosis not present

## 2024-10-22 DIAGNOSIS — I129 Hypertensive chronic kidney disease with stage 1 through stage 4 chronic kidney disease, or unspecified chronic kidney disease: Secondary | ICD-10-CM | POA: Diagnosis not present

## 2024-10-22 DIAGNOSIS — N184 Chronic kidney disease, stage 4 (severe): Secondary | ICD-10-CM | POA: Diagnosis not present

## 2024-10-22 DIAGNOSIS — E1122 Type 2 diabetes mellitus with diabetic chronic kidney disease: Secondary | ICD-10-CM | POA: Diagnosis not present

## 2024-10-22 DIAGNOSIS — E875 Hyperkalemia: Secondary | ICD-10-CM | POA: Diagnosis not present

## 2024-10-22 DIAGNOSIS — G311 Senile degeneration of brain, not elsewhere classified: Secondary | ICD-10-CM | POA: Diagnosis not present

## 2024-10-27 DIAGNOSIS — E875 Hyperkalemia: Secondary | ICD-10-CM | POA: Diagnosis not present

## 2024-10-27 DIAGNOSIS — G311 Senile degeneration of brain, not elsewhere classified: Secondary | ICD-10-CM | POA: Diagnosis not present

## 2024-10-27 DIAGNOSIS — D649 Anemia, unspecified: Secondary | ICD-10-CM | POA: Diagnosis not present

## 2024-10-27 DIAGNOSIS — N184 Chronic kidney disease, stage 4 (severe): Secondary | ICD-10-CM | POA: Diagnosis not present

## 2024-10-27 DIAGNOSIS — I129 Hypertensive chronic kidney disease with stage 1 through stage 4 chronic kidney disease, or unspecified chronic kidney disease: Secondary | ICD-10-CM | POA: Diagnosis not present

## 2024-10-27 DIAGNOSIS — E1122 Type 2 diabetes mellitus with diabetic chronic kidney disease: Secondary | ICD-10-CM | POA: Diagnosis not present

## 2024-10-29 DIAGNOSIS — G311 Senile degeneration of brain, not elsewhere classified: Secondary | ICD-10-CM | POA: Diagnosis not present

## 2024-10-29 DIAGNOSIS — I129 Hypertensive chronic kidney disease with stage 1 through stage 4 chronic kidney disease, or unspecified chronic kidney disease: Secondary | ICD-10-CM | POA: Diagnosis not present

## 2024-10-29 DIAGNOSIS — E1122 Type 2 diabetes mellitus with diabetic chronic kidney disease: Secondary | ICD-10-CM | POA: Diagnosis not present

## 2024-10-29 DIAGNOSIS — D649 Anemia, unspecified: Secondary | ICD-10-CM | POA: Diagnosis not present

## 2024-10-29 DIAGNOSIS — E875 Hyperkalemia: Secondary | ICD-10-CM | POA: Diagnosis not present

## 2024-10-29 DIAGNOSIS — N184 Chronic kidney disease, stage 4 (severe): Secondary | ICD-10-CM | POA: Diagnosis not present

## 2024-11-03 DIAGNOSIS — E875 Hyperkalemia: Secondary | ICD-10-CM | POA: Diagnosis not present

## 2024-11-03 DIAGNOSIS — N184 Chronic kidney disease, stage 4 (severe): Secondary | ICD-10-CM | POA: Diagnosis not present

## 2024-11-03 DIAGNOSIS — E1122 Type 2 diabetes mellitus with diabetic chronic kidney disease: Secondary | ICD-10-CM | POA: Diagnosis not present

## 2024-11-03 DIAGNOSIS — D649 Anemia, unspecified: Secondary | ICD-10-CM | POA: Diagnosis not present

## 2024-11-03 DIAGNOSIS — G311 Senile degeneration of brain, not elsewhere classified: Secondary | ICD-10-CM | POA: Diagnosis not present

## 2024-11-03 DIAGNOSIS — I129 Hypertensive chronic kidney disease with stage 1 through stage 4 chronic kidney disease, or unspecified chronic kidney disease: Secondary | ICD-10-CM | POA: Diagnosis not present
